# Patient Record
Sex: Female | Born: 1945
Health system: Southern US, Community
[De-identification: ages and names within clinical notes are randomized; demographics above are authoritative.]

## PROBLEM LIST (undated history)

## (undated) DIAGNOSIS — I10 Essential (primary) hypertension: Secondary | ICD-10-CM

## (undated) DIAGNOSIS — M1990 Unspecified osteoarthritis, unspecified site: Secondary | ICD-10-CM

## (undated) DIAGNOSIS — M539 Dorsopathy, unspecified: Secondary | ICD-10-CM

## (undated) DIAGNOSIS — R7611 Nonspecific reaction to tuberculin skin test without active tuberculosis: Secondary | ICD-10-CM

## (undated) DIAGNOSIS — R51 Headache: Secondary | ICD-10-CM

## (undated) DIAGNOSIS — F32A Depression, unspecified: Secondary | ICD-10-CM

## (undated) DIAGNOSIS — Z974 Presence of external hearing-aid: Secondary | ICD-10-CM

## (undated) DIAGNOSIS — Z972 Presence of dental prosthetic device (complete) (partial): Secondary | ICD-10-CM

## (undated) DIAGNOSIS — F419 Anxiety disorder, unspecified: Secondary | ICD-10-CM

## (undated) DIAGNOSIS — R519 Headache, unspecified: Secondary | ICD-10-CM

## (undated) DIAGNOSIS — Z973 Presence of spectacles and contact lenses: Secondary | ICD-10-CM

## (undated) DIAGNOSIS — F329 Major depressive disorder, single episode, unspecified: Secondary | ICD-10-CM

## (undated) HISTORY — PX: LUMBAR FUSION: SHX111

## (undated) HISTORY — PX: JOINT REPLACEMENT: SHX530

## (undated) HISTORY — PX: BREAST SURGERY: SHX581

## (undated) HISTORY — PX: TONSILLECTOMY: SUR1361

## (undated) HISTORY — PX: ABDOMINAL HYSTERECTOMY: SHX81

## (undated) HISTORY — PX: BACK SURGERY: SHX140

## (undated) HISTORY — PX: MYRINGOTOMY WITH TUBE PLACEMENT: SHX5663

## (undated) HISTORY — PX: TUBAL LIGATION: SHX77

---

## 1999-01-27 ENCOUNTER — Observation Stay (HOSPITAL_COMMUNITY): Admission: RE | Admit: 1999-01-27 | Discharge: 1999-01-28 | Payer: Self-pay | Admitting: Neurosurgery

## 1999-01-27 ENCOUNTER — Encounter: Payer: Self-pay | Admitting: Neurosurgery

## 2001-09-13 ENCOUNTER — Emergency Department (HOSPITAL_COMMUNITY): Admission: EM | Admit: 2001-09-13 | Discharge: 2001-09-14 | Payer: Self-pay | Admitting: *Deleted

## 2001-09-14 ENCOUNTER — Ambulatory Visit (HOSPITAL_COMMUNITY): Admission: RE | Admit: 2001-09-14 | Discharge: 2001-09-14 | Payer: Self-pay | Admitting: *Deleted

## 2004-04-27 ENCOUNTER — Emergency Department: Payer: Self-pay | Admitting: Emergency Medicine

## 2005-03-26 ENCOUNTER — Other Ambulatory Visit: Payer: Self-pay

## 2005-03-26 ENCOUNTER — Emergency Department: Payer: Self-pay | Admitting: Emergency Medicine

## 2005-05-18 ENCOUNTER — Ambulatory Visit: Payer: Self-pay | Admitting: Unknown Physician Specialty

## 2007-01-16 ENCOUNTER — Ambulatory Visit: Payer: Self-pay | Admitting: Internal Medicine

## 2007-01-18 ENCOUNTER — Ambulatory Visit: Payer: Self-pay | Admitting: Family Medicine

## 2009-09-27 ENCOUNTER — Ambulatory Visit: Payer: Self-pay | Admitting: Unknown Physician Specialty

## 2009-09-30 ENCOUNTER — Ambulatory Visit: Payer: Self-pay | Admitting: Internal Medicine

## 2010-05-10 ENCOUNTER — Emergency Department: Payer: Self-pay | Admitting: Emergency Medicine

## 2010-05-30 ENCOUNTER — Ambulatory Visit: Payer: Self-pay | Admitting: Rheumatology

## 2010-06-28 ENCOUNTER — Ambulatory Visit: Payer: Self-pay | Admitting: Neurosurgery

## 2010-07-11 ENCOUNTER — Inpatient Hospital Stay (HOSPITAL_COMMUNITY)
Admission: RE | Admit: 2010-07-11 | Discharge: 2010-07-11 | DRG: 865 | Disposition: A | Discharge status: Home / Self Care | Payer: BC Managed Care – PPO | Source: Ambulatory Visit | Attending: Neurosurgery | Admitting: Neurosurgery

## 2010-07-11 ENCOUNTER — Ambulatory Visit (HOSPITAL_COMMUNITY): Payer: BC Managed Care – PPO

## 2010-07-11 DIAGNOSIS — F17200 Nicotine dependence, unspecified, uncomplicated: Secondary | ICD-10-CM | POA: Diagnosis present

## 2010-07-11 DIAGNOSIS — M47812 Spondylosis without myelopathy or radiculopathy, cervical region: Principal | ICD-10-CM | POA: Diagnosis present

## 2010-07-11 DIAGNOSIS — R51 Headache: Secondary | ICD-10-CM | POA: Diagnosis present

## 2010-07-11 DIAGNOSIS — I10 Essential (primary) hypertension: Secondary | ICD-10-CM | POA: Diagnosis present

## 2010-07-11 LAB — BASIC METABOLIC PANEL
BUN: 8 mg/dL (ref 6–23)
CO2: 29 mEq/L (ref 19–32)
Calcium: 9.4 mg/dL (ref 8.4–10.5)
Chloride: 101 mEq/L (ref 96–112)
Creatinine, Ser: 0.83 mg/dL (ref 0.4–1.2)
GFR calc Af Amer: >60 mL/min (ref >60)
GFR calc non Af Amer: >60 mL/min (ref >60)
Glucose, Bld: 97 mg/dL (ref 70–99)
Potassium: 3.4 mEq/L — ABNORMAL LOW (ref 3.5–5.1)
Sodium: 138 mEq/L (ref 135–145)

## 2010-07-11 LAB — CBC
HCT: 45.1 % (ref 36.0–46.0)
Hemoglobin: 15.8 g/dL — ABNORMAL HIGH (ref 12.0–15.0)
MCH: 32.1 pg (ref 26.0–34.0)
MCHC: 35 g/dL (ref 30.0–36.0)
MCV: 91.7 fL (ref 78.0–100.0)
Platelets: 233 10*3/uL (ref 150–400)
RBC: 4.92 MIL/uL (ref 3.87–5.11)
RDW: 13.4 % (ref 11.5–15.5)
WBC: 9 10*3/uL (ref 4.0–10.5)

## 2010-07-11 LAB — DIFFERENTIAL
Basophils Absolute: 0 10*3/uL (ref 0.0–0.1)
Basophils Relative: 0 % (ref 0–1)
Eosinophils Absolute: 0.2 10*3/uL (ref 0.0–0.7)
Eosinophils Relative: 2 % (ref 0–5)
Lymphocytes Relative: 32 % (ref 12–46)
Lymphs Abs: 2.8 10*3/uL (ref 0.7–4.0)
Monocytes Absolute: 0.6 10*3/uL (ref 0.1–1.0)
Monocytes Relative: 7 % (ref 3–12)
Neutro Abs: 5.3 10*3/uL (ref 1.7–7.7)
Neutrophils Relative %: 59 % (ref 43–77)

## 2010-07-11 LAB — TYPE AND SCREEN
ABO/RH(D): A POS
Antibody Screen: NEGATIVE

## 2010-07-11 LAB — ABO/RH: ABO/RH(D): A POS

## 2010-08-04 NOTE — Op Note (Signed)
Carol [name], Carol [name]                ACCOUNT NO.:  [id number]  MEDICAL RECORD NO.:  [id number]           PATIENT TYPE:  I  LOCATION:  3535                         FACILITY:  [facility]  PHYSICIAN:  Emrie Gayle A. Javonni Macke, M.D.    DATE OF BIRTH:  03-18-1946  DATE OF PROCEDURE:  07/11/2010 DATE OF DISCHARGE:  07/11/2010                              OPERATIVE REPORT   PREOPERATIVE DIAGNOSES:  C5-6 spondylosis with stenosis.  Status post C4- 5 anterior cervical diskectomy and fusion with allograft and plating.  POSTOPERATIVE DIAGNOSES:  C5-6 spondylosis with stenosis.  Status post C4-5 anterior cervical diskectomy and fusion with allograft and plating.  PROCEDURE NAME:  Reexploration of C4-5 anterior cervical fusion with removal of C4-5 anterior cervical plate and instrumentation.  C5-6 anterior cervical diskectomy and fusion with allograft and plating.  SURGEON:  Kathaleen Maser. Maudene Stotler, MD  ASSISTANT:  Donalee Citrin, MD  ANESTHESIA:  General endotracheal.  INDICATIONS:  Ms. Troyer is a 65 year old female status post a C4-5 anterior cervical fusion with good results done remotely in the past. The patient presents now with significant neck and bilateral upper extremity pain, paresthesias and weakness consistent with C6 radiculopathies and some element of a early cervical myelopathy failing conservative management.  Workup demonstrates evidence of marked stenosis at C5-6 level.  Fusion at C4-5 appears solid.  The patient presents now for C5-6 anterior cervical diskectomy and fusion with allograft and plating in the hopes of improving her symptoms.  OPERATIVE NOTE:  The patient was brought to the operating room and placed on the operating table in supine position.  After adequate level of anesthesia was achieved, the patient was placed in supine with neck slightly extended and held in place with halter traction.  The patient's anterior cervical region was prepped and draped sterilely.  A #10 blade was used  to make a linear skin incision overlying the C4-5 interspace. This was carried down sharply to the platysma.  The platysma was divided vertically and dissection proceeded along the medial border of the sternomastoid muscle and carotid sheath.  Trachea and esophagus were mobilized and tracked towards the left.  Prevertebral fascia was stripped off the anterior spinal column.  Longus colli muscle was then elevated bilaterally using electrocautery.  Deep self-retaining retractor was placed.  Intraoperative fluoroscopy was used and the levels were confirmed.  Previously placed anterior cervical plate at C4- 5 was identified.  This was then disassembled and removed.  Fusion at C4- 5 was then inspected and found to be quite solid.  Disk space at C5-6 was then incised with a #15 blade in a rectangular fashion.  Wide disk space clean-out was achieved using pituitary rongeurs, forward and backward angled Karlin curettes, Kerrison rongeurs, and high-speed drill.  All elements of disk removed down to the level of the posterior annulus.  The microscope was brought into the field and used throughout the remainder of the diskectomy.  The remaining aspects of annulus and osteophytes were removed down to the posterior annulus.  Microscope was then brought into the field and used for the remainder of the diskectomy.  The remaining aspects of  annulus and osteophytes were removed using high-speed drill down to the level of the posterior longitudinal ligament.  The posterior longitudinal ligament was elevated and resected in a piecemeal fashion using Kerrison rongeurs.  The underlying thecal sac was then identified.  Wide central decompression was then performed by undercutting the bodies of C5 and C6. Decompression was then proceeded out each neural foramen.  Wide anterior foraminotomies were then performed along the course of the exiting C6 nerve roots bilaterally.  At this point, a very thorough diskectomy  was then achieved.  There was no evidence of injury to thecal sac or nerve roots.  Wound was then irrigated with antibiotic solution.  Gelfoam was placed topically for hemostasis and found to be good.  A 7-mm Cornerstone allograft wedge was then packed into place and recessed approximately 1 mm of anterior cortical margin.  A 25-mm Atlantis anterior cervical plate was then placed over the C5 and C6 levels using a fluoroscopic guidance.  All 4 screws were given final tightening and found to be solidly within bone.  Locking screws were engaged at both levels.  Final images revealed good position of bone grafts, hardware at proper operative level, and normal alignment of spine.  Wound was then inspected for hemostasis and found to be good.  Wound was then closed in a typical fashion.  Steri-Strips and sterile dressing were applied. There were no complications.  The patient tolerated the procedure well and she returned to the recovery room postoperatively.          ______________________________ Kathaleen Maser Bernise Sylvain, M.D.     HAP/MEDQ  D:  07/11/2010  T:  07/12/2010  Job:  [id number]  Electronically Signed by Julio Sicks M.D. on 08/04/2010 11:16:43 AM

## 2010-09-01 ENCOUNTER — Ambulatory Visit
Admission: RE | Admit: 2010-09-01 | Discharge: 2010-09-01 | Disposition: A | Discharge status: Home / Self Care | Payer: BC Managed Care – PPO | Source: Ambulatory Visit | Attending: Neurosurgery | Admitting: Neurosurgery

## 2010-09-01 ENCOUNTER — Other Ambulatory Visit: Payer: Self-pay | Admitting: Neurosurgery

## 2010-09-01 DIAGNOSIS — M542 Cervicalgia: Secondary | ICD-10-CM

## 2010-09-01 DIAGNOSIS — M545 Low back pain, unspecified: Secondary | ICD-10-CM

## 2010-09-01 DIAGNOSIS — M79606 Pain in leg, unspecified: Secondary | ICD-10-CM

## 2010-09-05 ENCOUNTER — Ambulatory Visit
Admission: RE | Admit: 2010-09-05 | Discharge: 2010-09-05 | Disposition: A | Discharge status: Home / Self Care | Payer: BC Managed Care – PPO | Source: Ambulatory Visit | Attending: Neurosurgery | Admitting: Neurosurgery

## 2010-09-05 DIAGNOSIS — M545 Low back pain, unspecified: Secondary | ICD-10-CM

## 2010-09-05 DIAGNOSIS — M79606 Pain in leg, unspecified: Secondary | ICD-10-CM

## 2011-05-15 ENCOUNTER — Emergency Department: Payer: Self-pay | Admitting: Emergency Medicine

## 2011-05-15 LAB — URINALYSIS, COMPLETE
Ph: 6 (ref 4.5–8.0)
Protein: 100
Squamous Epithelial: 10
WBC UR: 57 /HPF (ref 0–5)

## 2011-05-15 LAB — CBC
HGB: 17.1 g/dL — ABNORMAL HIGH (ref 12.0–16.0)
MCHC: 33.8 g/dL (ref 32.0–36.0)

## 2011-05-15 LAB — COMPREHENSIVE METABOLIC PANEL
Alkaline Phosphatase: 77 U/L (ref 50–136)
Anion Gap: 16 (ref 7–16)
Calcium, Total: 9.7 mg/dL (ref 8.5–10.1)
Co2: 25 mmol/L (ref 21–32)
EGFR (Non-African Amer.): >60
Osmolality: 279 (ref 275–301)
Potassium: 3.2 mmol/L — ABNORMAL LOW (ref 3.5–5.1)
Sodium: 139 mmol/L (ref 136–145)

## 2011-05-15 LAB — LIPASE, BLOOD: Lipase: 153 U/L (ref 73–393)

## 2011-05-17 LAB — URINE CULTURE

## 2011-08-18 ENCOUNTER — Ambulatory Visit: Payer: Self-pay

## 2011-08-23 ENCOUNTER — Ambulatory Visit: Payer: Self-pay | Admitting: Family Medicine

## 2011-10-02 ENCOUNTER — Observation Stay: Payer: Self-pay | Admitting: Internal Medicine

## 2011-10-02 LAB — COMPREHENSIVE METABOLIC PANEL
Bilirubin,Total: 1 mg/dL (ref 0.2–1.0)
Calcium, Total: 8.9 mg/dL (ref 8.5–10.1)
Chloride: 100 mmol/L (ref 98–107)
Co2: 27 mmol/L (ref 21–32)
EGFR (Non-African Amer.): >60
Potassium: 3.1 mmol/L — ABNORMAL LOW (ref 3.5–5.1)
SGOT(AST): 88 U/L — ABNORMAL HIGH (ref 15–37)
SGPT (ALT): 86 U/L — ABNORMAL HIGH
Sodium: 139 mmol/L (ref 136–145)

## 2011-10-03 LAB — CBC WITH DIFFERENTIAL/PLATELET
Eosinophil %: 0.3 %
Lymphocyte #: 1.9 10*3/uL (ref 1.0–3.6)
Lymphocyte %: 15.9 %
MCV: 93 fL (ref 80–100)
Monocyte %: 10.4 %
Neutrophil #: 8.9 10*3/uL — ABNORMAL HIGH (ref 1.4–6.5)
Platelet: 241 10*3/uL (ref 150–440)

## 2011-10-03 LAB — BASIC METABOLIC PANEL
Anion Gap: 7 (ref 7–16)
BUN: 20 mg/dL — ABNORMAL HIGH (ref 7–18)
Chloride: 103 mmol/L (ref 98–107)
Co2: 27 mmol/L (ref 21–32)
EGFR (African American): >60
Osmolality: 276 (ref 275–301)

## 2011-10-03 LAB — MAGNESIUM: Magnesium: 1.7 mg/dL — ABNORMAL LOW

## 2011-10-04 LAB — CBC WITH DIFFERENTIAL/PLATELET
Basophil #: 0 10*3/uL (ref 0.0–0.1)
Eosinophil %: 1.4 %
Lymphocyte #: 1.3 10*3/uL (ref 1.0–3.6)
MCV: 93 fL (ref 80–100)
Monocyte #: 0.7 x10 3/mm (ref 0.2–0.9)
Monocyte %: 10.5 %
Neutrophil %: 68.5 %
Platelet: 171 10*3/uL (ref 150–440)
RBC: 4.32 10*6/uL (ref 3.80–5.20)
RDW: 13.1 % (ref 11.5–14.5)
WBC: 6.8 10*3/uL (ref 3.6–11.0)

## 2011-10-04 LAB — BASIC METABOLIC PANEL
Anion Gap: 10 (ref 7–16)
BUN: 11 mg/dL (ref 7–18)
Calcium, Total: 8 mg/dL — ABNORMAL LOW (ref 8.5–10.1)
EGFR (Non-African Amer.): >60
Glucose: 122 mg/dL — ABNORMAL HIGH (ref 65–99)
Osmolality: 278 (ref 275–301)

## 2011-10-04 LAB — MAGNESIUM: Magnesium: 2.2 mg/dL

## 2012-07-17 ENCOUNTER — Ambulatory Visit: Payer: Self-pay | Admitting: Obstetrics and Gynecology

## 2012-07-26 ENCOUNTER — Ambulatory Visit: Payer: Self-pay | Admitting: Obstetrics and Gynecology

## 2012-07-30 LAB — PATHOLOGY REPORT

## 2012-08-02 ENCOUNTER — Ambulatory Visit: Payer: Self-pay | Admitting: Gynecologic Oncology

## 2012-08-06 ENCOUNTER — Ambulatory Visit: Payer: Self-pay | Admitting: Gynecologic Oncology

## 2012-08-18 ENCOUNTER — Ambulatory Visit: Payer: Self-pay | Admitting: Gynecologic Oncology

## 2012-08-27 ENCOUNTER — Ambulatory Visit: Payer: Self-pay | Admitting: Obstetrics and Gynecology

## 2012-08-27 LAB — CBC
MCH: 31.3 pg (ref 26.0–34.0)
MCV: 91 fL (ref 80–100)
WBC: 7 10*3/uL (ref 3.6–11.0)

## 2012-08-27 LAB — BASIC METABOLIC PANEL
Anion Gap: 4 — ABNORMAL LOW (ref 7–16)
Chloride: 105 mmol/L (ref 98–107)
Co2: 31 mmol/L (ref 21–32)
Creatinine: 0.81 mg/dL (ref 0.60–1.30)
EGFR (African American): >60
EGFR (Non-African Amer.): >60
Glucose: 81 mg/dL (ref 65–99)

## 2012-09-04 ENCOUNTER — Inpatient Hospital Stay: Payer: Self-pay | Admitting: Obstetrics and Gynecology

## 2012-09-04 LAB — BASIC METABOLIC PANEL
Anion Gap: 6 — ABNORMAL LOW (ref 7–16)
Anion Gap: 7 (ref 7–16)
Anion Gap: 6 — ABNORMAL LOW (ref 7–16)
BUN: 15 mg/dL (ref 7–18)
Calcium, Total: 7.7 mg/dL — ABNORMAL LOW (ref 8.5–10.1)
Chloride: 107 mmol/L (ref 98–107)
Chloride: 110 mmol/L — ABNORMAL HIGH (ref 98–107)
Co2: 24 mmol/L (ref 21–32)
Co2: 24 mmol/L (ref 21–32)
Creatinine: 1.55 mg/dL — ABNORMAL HIGH (ref 0.60–1.30)
EGFR (African American): 40 — ABNORMAL LOW
EGFR (African American): 37 — ABNORMAL LOW
EGFR (Non-African Amer.): 35 — ABNORMAL LOW
EGFR (Non-African Amer.): 32 — ABNORMAL LOW
Glucose: 106 mg/dL — ABNORMAL HIGH (ref 65–99)
Osmolality: 279 (ref 275–301)
Osmolality: 281 (ref 275–301)
Potassium: 3.9 mmol/L (ref 3.5–5.1)
Sodium: 137 mmol/L (ref 136–145)
Sodium: 140 mmol/L (ref 136–145)

## 2012-09-04 LAB — CBC WITH DIFFERENTIAL/PLATELET
Basophil %: 0.2 %
Eosinophil #: 0 10*3/uL (ref 0.0–0.7)
HCT: 36 % (ref 35.0–47.0)
Lymphocyte #: 0.6 10*3/uL — ABNORMAL LOW (ref 1.0–3.6)
Lymphocyte %: 4.3 %
MCH: 31.3 pg (ref 26.0–34.0)
MCHC: 34.4 g/dL (ref 32.0–36.0)
Monocyte %: 6.6 %
Neutrophil %: 88.9 %
Platelet: 170 10*3/uL (ref 150–440)
RBC: 3.97 10*6/uL (ref 3.80–5.20)

## 2012-09-04 LAB — CREATININE, URINE, RANDOM: Creatinine, Urine Random: 46.6 mg/dL (ref 30.0–125.0)

## 2012-09-05 LAB — CBC WITH DIFFERENTIAL/PLATELET
Basophil #: 0 10*3/uL (ref 0.0–0.1)
Basophil %: 0.1 %
Eosinophil #: 0 10*3/uL (ref 0.0–0.7)
Eosinophil %: 0.2 %
HGB: 12.1 g/dL (ref 12.0–16.0)
Lymphocyte %: 11.8 %
MCH: 31.3 pg (ref 26.0–34.0)
MCHC: 34.5 g/dL (ref 32.0–36.0)
Platelet: 152 10*3/uL (ref 150–440)
WBC: 7.8 10*3/uL (ref 3.6–11.0)

## 2012-09-05 LAB — BASIC METABOLIC PANEL
Anion Gap: 5 — ABNORMAL LOW (ref 7–16)
BUN: 10 mg/dL (ref 7–18)
Calcium, Total: 8.2 mg/dL — ABNORMAL LOW (ref 8.5–10.1)
Chloride: 110 mmol/L — ABNORMAL HIGH (ref 98–107)
Chloride: 111 mmol/L — ABNORMAL HIGH (ref 98–107)
Co2: 26 mmol/L (ref 21–32)
Creatinine: 1.28 mg/dL (ref 0.60–1.30)
EGFR (African American): 42 — ABNORMAL LOW
EGFR (African American): 51 — ABNORMAL LOW
EGFR (Non-African Amer.): 44 — ABNORMAL LOW
Glucose: 105 mg/dL — ABNORMAL HIGH (ref 65–99)
Osmolality: 280 (ref 275–301)
Potassium: 3.8 mmol/L (ref 3.5–5.1)
Sodium: 140 mmol/L (ref 136–145)
Sodium: 141 mmol/L (ref 136–145)

## 2012-09-05 LAB — URINALYSIS, COMPLETE
Bilirubin,UR: NEGATIVE
Glucose,UR: NEGATIVE mg/dL (ref 0–75)
Nitrite: NEGATIVE
Protein: 30

## 2012-09-05 LAB — PATHOLOGY REPORT

## 2012-09-05 LAB — TROPONIN I: Troponin-I: <0.02 ng/mL

## 2012-09-06 LAB — BASIC METABOLIC PANEL
Anion Gap: 5 — ABNORMAL LOW (ref 7–16)
Calcium, Total: 8.9 mg/dL (ref 8.5–10.1)
Chloride: 104 mmol/L (ref 98–107)
EGFR (African American): >60
Osmolality: 271 (ref 275–301)
Potassium: 3.4 mmol/L — ABNORMAL LOW (ref 3.5–5.1)
Sodium: 136 mmol/L (ref 136–145)

## 2012-09-06 LAB — CBC WITH DIFFERENTIAL/PLATELET
HCT: 37.9 % (ref 35.0–47.0)
HGB: 13 g/dL (ref 12.0–16.0)
Monocyte #: 0.6 x10 3/mm (ref 0.2–0.9)
Monocyte %: 8.3 %
Platelet: 178 10*3/uL (ref 150–440)
RDW: 13.9 % (ref 11.5–14.5)
WBC: 7.6 10*3/uL (ref 3.6–11.0)

## 2012-10-16 ENCOUNTER — Ambulatory Visit: Payer: Self-pay | Admitting: Specialist

## 2012-11-21 DIAGNOSIS — M1990 Unspecified osteoarthritis, unspecified site: Secondary | ICD-10-CM | POA: Insufficient documentation

## 2012-11-21 DIAGNOSIS — F1190 Opioid use, unspecified, uncomplicated: Secondary | ICD-10-CM | POA: Insufficient documentation

## 2012-11-21 DIAGNOSIS — M545 Low back pain, unspecified: Secondary | ICD-10-CM | POA: Insufficient documentation

## 2012-11-21 DIAGNOSIS — M5431 Sciatica, right side: Secondary | ICD-10-CM | POA: Insufficient documentation

## 2013-02-26 DIAGNOSIS — M797 Fibromyalgia: Secondary | ICD-10-CM | POA: Insufficient documentation

## 2013-02-26 DIAGNOSIS — M79641 Pain in right hand: Secondary | ICD-10-CM | POA: Insufficient documentation

## 2013-05-23 ENCOUNTER — Ambulatory Visit: Payer: Self-pay | Admitting: Medical

## 2013-05-26 ENCOUNTER — Ambulatory Visit: Payer: Self-pay | Admitting: Physician Assistant

## 2013-10-14 ENCOUNTER — Ambulatory Visit: Payer: Self-pay | Admitting: Otolaryngology

## 2014-01-15 DIAGNOSIS — M1712 Unilateral primary osteoarthritis, left knee: Secondary | ICD-10-CM | POA: Insufficient documentation

## 2014-02-03 ENCOUNTER — Ambulatory Visit: Payer: Self-pay | Admitting: Unknown Physician Specialty

## 2014-03-04 ENCOUNTER — Ambulatory Visit: Payer: Self-pay | Admitting: Unknown Physician Specialty

## 2014-03-20 HISTORY — PX: HAND TENDON SURGERY: SHX663

## 2014-04-07 DIAGNOSIS — Z96652 Presence of left artificial knee joint: Secondary | ICD-10-CM | POA: Insufficient documentation

## 2014-06-18 DIAGNOSIS — Z96652 Presence of left artificial knee joint: Secondary | ICD-10-CM | POA: Insufficient documentation

## 2014-07-07 NOTE — H&P (Signed)
PATIENT NAME:  Carol NimsREILLY, Ashaya S MR#:  [id number] DATE OF BIRTH:  01-06-1946  DATE OF ADMISSION:  10/02/2011  PRESENTING COMPLAINT: Nausea and vomiting x3 days.   HISTORY OF PRESENT ILLNESS: This is a 69 year old Caucasian female who complains of a three day history of intractable nausea and vomiting preceded by three days of diarrhea and mild abdominal discomfort. She denies ingestion of any poorly cooked foods or stored foods. She lives with her husband and another household member and neither of them have been affected with similar symptoms. The patient also complains of low-grade fever. Denies any recent flulike illness. She has not taken any antiemetics or antidiarrheal agents and has not sought medical attention elsewhere. She was seen in my office today where she was noted to have markedly lost weight. She was quite tachycardic with a 40 pound rise in orthostatic measurements and as such will admit her for rehydration.   PAST MEDICAL HISTORY:  1. Osteoporosis. 2. Osteoarthritis. 3. Sciatica. 4. Migraine headaches.  PAST SURGICAL HISTORY: 1. Knee surgery in 1998. 2. Neck surgery in 2000. 3. Tubal ligation in 1994.  5. Tonsillectomy and adenoidectomy.   CURRENT MEDICATIONS:  1. Citalopram hydrobromide 20 mg once a day.  2. Citracal/Vitamin D 250/200 1 tablet daily.  3. Maxalt-MLT 10 mg use as directed p.r.n.   SOCIAL HISTORY: Active alcohol user. Denies social drug use. She is an active smoker. Currently married. Resides with her husband.   ALLERGIES: Penicillin.   FAMILY HISTORY: Noncontributory except history of systemic lupus erythematosus.   REVIEW OF SYSTEMS: SYSTEMIC: Feels lethargic. Denies any night sweats. Recent 10 pound weight loss since her previous visit. HEAD: Denies any headache. NECK: No neck pain or stiffness. EYES: Itching, watering of the eyes. ENT: Hearing loss in right ear. Postnasal drip. CARDIOVASCULAR: Denies chest pain. PULMONARY: Denies cough or shortness of  breath. GASTROINTESTINAL: Normal appetite. Complains of dyspepsia. GENITOURINARY: Denies urinary frequency. MUSCULOSKELETAL: No new arthritic pains. NEUROLOGICAL: Denies dizziness, motor or sensory disturbances. PSYCHOLOGICAL: Denies any anxiety or worsening of depression.   PHYSICAL EXAMINATION:   GENERAL: Elderly female who looks younger than stated age.   VITAL SIGNS: Temperature 92 Fahrenheit, pulse 134 per minute, blood pressure 120/90, oxygen saturation 97% on room air, weight 155.6 pound.   HEENT: Reduced skin turgor. Dry oral mucosa. Sunken eyes. Not pale.   LUNGS: Clear to auscultation bilaterally. No rales or wheeze.   CARDIOVASCULAR: Heart regular. Normal heart sounds.   ABDOMEN: Full, soft, mild right lower and left lower quadrant tenderness.   EXTREMITIES: Warm, well perfused.   ORTHOSTATIC VITAL SIGNS: Lying pulse 127, blood pressure 120/92; standing pulse 167, blood pressure 110/90.   IMPRESSION: This is a 69 year old lady with gastroenteritis, orthostatic hypotension, and dehydration.   PLAN:  1. Will admit to observation. 2. Will rehydrate. 3. Check complete metabolic profile. 4. Order stool culture and analysis.  5. Administer antiemetics.  6. For depression will continue citalopram.   7. Will adjust her orders based on her biochemical results.   ____________________________ Silas FloodSheikh A. Ellsworth Lennoxejan-Sie, MD sat:drc D: 10/02/2011 15:28:18 ET T: 10/02/2011 16:04:29 ET JOB#: [id number]  cc: Sheikh A. Ellsworth Lennoxejan-Sie, MD, <Dictator> Charlesetta GaribaldiSHEIKH A TEJAN-SIE MD ELECTRONICALLY SIGNED 11/08/2011 12:47

## 2014-07-10 NOTE — Consult Note (Signed)
PCN: Hives   Impression 1. elevated BP n hx of HTN 2. ARI from gent/toradol  3. s/p hysterectomy   Plan 1. would not d/c on po medds as pt has been f/u dr Linus Makotejani sie an d suspect elevated BP here from pain and anxiety  2. creatinie trending down, can f/u PCP repeat BMP nex tweek as well  thank you  pt could be d/c when ever OB feels ready  d/w Dr Bonney AidStaebler thank you for consult   Electronic Signatures for Addendum Section:  Adrian SaranMody, Ladan Vanderzanden (MD) (Signed Addendum 19-Jun-14 10:58)  [id number]   Electronic Signatures: Adrian SaranMody, Taleeya Blondin (MD)  (Signed 19-Jun-14 10:58)  Authored: Home Medications, Allergies, Impression/Plan   Last Updated: 19-Jun-14 10:58 by Adrian SaranMody, Abbey Veith (MD)

## 2014-07-10 NOTE — Op Note (Signed)
PATIENT NAME:  Carol NimsREILLY, Jennife S MR#:  [id number] DATE OF BIRTH:  1945/05/29  DATE OF OPERATION:  07/26/2012  PREOPERATIVE DIAGNOSES: 1.  Postmenopausal bleeding.  2.  Endometrial polyps.   POSTOPERATIVE DIAGNOSES: 1.  Postmenopausal bleeding.  2.  Endometrial polyps.   OPEARTIONS PERFORMED: Hysteroscopy, and dilatation and curettage.   ANESTHESIA USED: General.   PRIMARY SURGEON: Vena AustriaAndreas Kenneisha Cochrane, MD.  ESTIMATED BLOOD LOSS: 25 mL.   OPERATIVE FLUIDS: 600 mL.   URINE OUTPUT: 25 mL straight cath at the beginning of the case.   COMPLICATIONS: None.   FINDINGS: The endometrial cavity contained several small polyps, a series of which was coming off of the left cornua, one posterior and several left-sided polyps. These were removed with a combination of polyp forceps and sharp curettage. The hysteroscopy at the end of the case revealed polyps had been removed.   SPECIMENS REMOVED: Endometrial curettings.   PATIENT'S CONDITION FOLLOWING PROCEDURE: Stable.   PROCEDURE IN DETAIL: Risks, benefits, and alternatives of the procedure were discussed with the patient prior to proceeding to the operating room. The patient was taken to the operating room where she was placed under general endotracheal anesthesia. The patient was prepped and draped in the usual sterile fashion. A sterile speculum was placed, visualizing the cervix. The anterior lip of the cervix was grasped with a single-toothed tenaculum and the cervix was serially dilated using Pratt dilators. Hysteroscopy was then performed, noting the above findings. Following hysteroscopy, sharp curettage was performed. Repeat hysteroscopy revealed that several polyps were still present on the right side of the uterine fundus. Polyp forceps were used to remove these polyps and sharp curettage was repeated. Following this, hysteroscopy was undertaken one more time noting the endometrial polyps had been removed successfully. The single-toothed  tenaculum was removed. The tenaculum sites were noted to be hemostatic. Sponge and instrument counts were correct x 2. The patient tolerated the procedure well and was taken to the recovery room in stable condition.    ____________________________ Florina OuAndreas M. Bonney AidStaebler, MD ams:dm D: 07/28/2012 11:00:46 ET T: 07/29/2012 07:43:23 ET JOB#: [id number]  cc: Florina OuAndreas M. Bonney AidStaebler, MD, <Dictator> Lorrene ReidANDREAS M Clint Biello MD ELECTRONICALLY SIGNED 08/06/2012 20:56

## 2014-07-10 NOTE — Op Note (Signed)
PATIENT NAME:  Carol [name], Carol [name] MR#:  [id number] DATE OF BIRTH:  [birth year]  DATE OF PROCEDURE:  09/03/2012  PREOPERATIVE DIAGNOSES:  1.  Postmenopausal bleeding  2.  Endometrial intraepithelial neoplasia with atypia.  POSTOPERATIVE DIAGNOSES:  1.  Postmenopausal bleeding  2.  Endometrial intraepithelial neoplasia with atypia.  OPERATION PERFORMED: Robotic-assisted total laparoscopic hysterectomy and bilateral salpingo-oophorectomy.    ANESTHESIA:  General.   PRIMARY SURGEON: Vena Austria, MD   PREOPERATIVE ANTIBIOTICS: 600 mg of clindamycin and 115 mg of gentamicin.   DRAINS OR TUBES: Foley to gravity drainage.   IMPLANTS: None.   ESTIMATED BLOOD LOSS: 50 mL.  OPERATIVE FLUIDS: 1 liter crystalloid.   COMPLICATIONS: None.   SPECIMENS REMOVED: Uterus and cervix, right and left ovaries and tubes.   INTRAOPERATIVE FINDINGS: The cervix appeared grossly normal in appearance. The uterus, fallopian tubes and ovaries were normal to gross inspection. The remainder of the intra-abdominal anatomy was normal as well.   PATIENT CONDITION FOLLOWING THE PROCEDURE: Stable.  PROCEDURE IN DETAIL: The risks, benefits, and alternatives of the procedure were discussed with the patient prior to proceeding to the operating room. The patient was taken to the operating room where general endotracheal anesthesia was administered. She was positioned in the dorsal lithotomy position, tested in steep Trendelenburg with peak inspiratory pressures of 17 with the table in steep trendelenburg. She was prepped and draped in the usual sterile fashion. Attention was turned to the patient's pelvis. An operative speculum was placed. The anterior lip of the cervix was grasped with a single-tooth tenaculum. A large V-Care retractor was placed making sure that the cuff was flushed with the cervix.  Following placement of the V-Care retractor, the single-tooth tenaculum and speculum were removed. A Foley catheter was  placed prior to placement of the V-Care. Attention was then turned to the patient's abdomen.  The umbilicus was infiltrated with Sensorcaine. A stab incision was made at the base of the umbilicus, and a Veress needle was used to gain entry into the peritoneal cavity.  Two clicks were heard.  The Veress flushed and drawing back did not return any fluid. Water drop test was consistent with intraperitoneal placement as well.  Opening pressure was 4.  Pneumoperitoneum was  begun to an insufflating pressure of 15.  An 8 mm camera port was then placed through the umbilicus. Following placement of the umbilical port, 2 lateral 8 mm robotic ports were placed with direct visualization as well as a right upper quadrant assistant port which was an 11 mm port.  The robot was then docked, and the procedure was begun. Attention was turned to the patient's left ovary. The IP ligament was identified. The ureter was noted coursing well away.  The IP ligament was ligated using the fenestrated bipolar and transected using the robotic monopolar scissors. The fallopian tube was freed off of its attachment to the mesosalpinx.  The round ligament was then cauterized using the fenestrated bipolar and transected using the monopolar scissors. The anterior leaf of the broad was skeletonized down to the level of the internal cervical os, a bladder flap begun. Attention was then turned to the posterior leaf of the broad ligament which was skeletonized down to the uterosacral ligaments. The left uterine artery was then skeletonized, allowing the ureters to fall laterally. The uterine artery was then cauterized using the fenestrated bipolar, transected using the monopolar scissors. Attention was then turned to the patient's right side where the same procedure was repeated. The anterior leaf of  the broad ligament was once again taken down to the level of the internal cervical os, and the bladder flap was joined to the previously started bladder  flap. Once the uterine artery was skeletonized, it was cauterized with a fenestrated bipolar, transected using the monopolar scissors. The uterine artery was then further lateralized.  An anterior colpotomy was then made using the monopolar scissors staying within the groove of the V-Care device.  There was some bleeding encountered at approximately 3:00 which was deemed to be a descending branch of the artery which had to be re-cauterized using the fenestrated bipolar. The colpotomy was carried around circumferentially and the specimen was amputated from the vagina.  The specimen was removed vaginally using ring forceps. The pelvis was irrigated, and the cuff was closed using a V-Loc suture in a running fashion from right to left. Once the left angle of the cuff had been closed, the V-loc was turned back on itself to anchor it. Following closure of the cuff, the pelvis was irrigated once again. Arista powder was applied to the cuff.  The 11 mm port was closed using a laparoscopic closure device and a 0 Vicryl suture. There was no remaining defect noted in the fascia after closure of the fascia. Attention was then turned to the patient's pelvis again.  Bimanual exam revealed no defects in the vaginal cuff. Cystoscopy was performed noting bilateral efflux of methylene blue dye from both ureters, no defects of the bladder dome. Following the cystoscopy, the Foley was replaced. The 11 mm port site was closed using a 4-0 Monocryl stitch. The remaining 8 mm port sites were closed using Dermabond. Sponge, needle, and instrument counts were correct x 2. The patient tolerated the procedure well and was taken to the recovery room in stable condition.    ____________________________ Florina OuAndreas M. Bonney AidStaebler, MD ams:cb D: 09/03/2012 21:58:16 ET T: 09/03/2012 22:15:05 ET JOB#: [id number]  cc: Florina OuAndreas M. Bonney AidStaebler, MD, <Dictator> Carmel SacramentoANDREAS Cathrine MusterM Delray Reza MD ELECTRONICALLY SIGNED 09/10/2012 7:26

## 2014-07-10 NOTE — Consult Note (Signed)
PATIENT NAME:  Carol [name], Carol [name] MR#:  [id number] DATE OF BIRTH:  1945/11/23  DATE OF CONSULTATION:  09/05/2012  PRIMARY CARE PHYSICIAN:  Dr. Ellsworth Lennox REFERRING PHYSICIAN:  Dr. Bonney Aid CONSULTING PHYSICIAN:  Mala Gibbard P. Madell Heino, MD  REASON FOR REQUEST:  Elevated blood pressure.   IMPRESSION: 1.  Elevated blood pressure.  No history of hypertension with baseline blood pressures 130s over 80s followed closely by Dr. Ellsworth Lennox.  2.  Acute renal failure from baseline at 0.8.  3.  Status post hysterectomy.  PLAN:  I would not discharge the patient on any blood pressure medications at this time as her baselines are 130/80s and is being followed closely by Dr. Ellsworth Lennox. I suspect some of her blood pressure issues here in the hospital are, in part, due to pain, as well as anxiety. We have written for hydralazine p.r.n. for the nurses to utilize.  As far as her acute renal failure from her baseline at 0.8, the creatinine is trending down, this is thought to be possibly due to gentamicin or Toradol or prerenal azotemia. She was seen by nephrology and patient could likely be discharged with followup with her primary care physician for repeat BMP and follow up with nephrology as an outpatient if necessary.   HISTORY OF PRESENT ILLNESS: This is a very pleasant 69 year old female who is status post hysterectomy on 09/03/2012. Hospitalist was consulted for elevated blood pressure.   REVIEW OF SYSTEMS:  CONSTITUTIONAL: No fever, fatigue, weakness, positive pain. EYES:  No blurred or double vision, glaucoma or cataracts. ENT:  No ear pain, hearing loss, seasonal allergies, snoring or postnasal drip. RESPIRATORY:  No cough, wheezing, hemoptysis, COPD. CARDIOVASCULAR:  No chest pain, orthopnea, edema, arrhythmia, dyspnea on exertion. GASTROINTESTINAL: No nausea, vomiting, diarrhea. Some mild abdominal pain. Tolerating a clear liquid diet. GENITOURINARY:  No dysuria or hematuria. ENDOCRINE: No polyuria or  polydipsia. HEMATOLOGIC AND LYMPHATIC:  No anemia or easy bruising. SKIN:  No rash or lesion. MUSCULOSKELETAL:   No limited activity. NEUROLOGIC:  No CVA, TIA or seizure. PSYCHIATRIC:  There is some anxiety/depression.  PAST MEDICAL HISTORY:  Anxiety/depression.   MEDICATIONS: 1.  Citalopram 10 mg daily.  2.  Claritin 1 tablet daily.  ALLERGIES:  PENICILLIN.  FAMILY HISTORY:  Positive for CAD, CVA.  SOCIAL HISTORY:  No tobacco, alcohol or drug use.   PAST SURGICAL HISTORY:  The patient is status post hysterectomy.   PHYSICAL EXAMINATION: VITAL SIGNS: Temperature is 98.4. Pulse is 81. Respiratory rate is 18, blood pressure 148/84, 96 on room air.  GENERAL: The patient is alert, oriented, not in acute distress.  HEENT: Head is atraumatic. Pupils are round. Sclerae anicteric. Mucous membranes are moist. Oropharynx is clear.  NECK:  Supple without JVD, carotid bruit or enlarged thyroid.  CARDIOVASCULAR: Regular rate and rhythm. No murmurs, gallops or rubs. PMI is not displaced.  ABDOMEN: Bowel sounds are positive, nontender, nondistended. No hepatosplenomegaly.  EXTREMITIES: No clubbing, cyanosis, edema. NEUROLOGIC:  Cranial nerves II through XII are intact. No focal deficit.  SKIN:  Without rash or lesions noted.   LABORATORY DATA: Sodium 140, potassium 4.0, chloride 110, bicarb 25, BUN 13, creatinine 1.49. Glucose is 105. Calcium 8.1.   Thank you for allowing [place] to participate in the care of the patient.  TIME SPENT:  Approximately 50 minutes on this consult.  Plan of care was discussed with Dr. Bonney Aid.    ____________________________ Janyth Contes. Juliene Pina, MD spm:ce D: 09/05/2012 11:01:55 ET T: 09/05/2012 12:25:31 ET JOB#: [id number]  cc:  Adelie Croswell P. Juliene PinaMody, MD, <Dictator> Silas FloodSheikh A. Ellsworth Lennoxejan-Sie, MD    Janyth ContesSITAL P Cigi Bega MD ELECTRONICALLY SIGNED 09/05/2012 21:54

## 2014-08-19 ENCOUNTER — Encounter: Payer: Self-pay | Admitting: Emergency Medicine

## 2014-08-19 ENCOUNTER — Emergency Department
Admission: EM | Admit: 2014-08-19 | Discharge: 2014-08-19 | Disposition: A | Discharge status: Home / Self Care | Payer: Medicare Other | Attending: Emergency Medicine | Admitting: Emergency Medicine

## 2014-08-19 DIAGNOSIS — Z880 Allergy status to penicillin: Secondary | ICD-10-CM | POA: Diagnosis not present

## 2014-08-19 DIAGNOSIS — Y9389 Activity, other specified: Secondary | ICD-10-CM | POA: Diagnosis not present

## 2014-08-19 DIAGNOSIS — Y9289 Other specified places as the place of occurrence of the external cause: Secondary | ICD-10-CM | POA: Diagnosis not present

## 2014-08-19 DIAGNOSIS — X58XXXA Exposure to other specified factors, initial encounter: Secondary | ICD-10-CM | POA: Diagnosis not present

## 2014-08-19 DIAGNOSIS — Y998 Other external cause status: Secondary | ICD-10-CM | POA: Insufficient documentation

## 2014-08-19 DIAGNOSIS — R55 Syncope and collapse: Secondary | ICD-10-CM | POA: Insufficient documentation

## 2014-08-19 DIAGNOSIS — T7840XA Allergy, unspecified, initial encounter: Secondary | ICD-10-CM | POA: Diagnosis present

## 2014-08-19 HISTORY — DX: Unspecified osteoarthritis, unspecified site: M19.90

## 2014-08-19 NOTE — ED Notes (Signed)
Started clindamycin yesterday and took husband oxycodone, felt shaky, heart racing, some nausea, is almost gone except for slight nausea

## 2014-08-19 NOTE — ED Notes (Signed)
Pt states she may have had an allergic reaction to an oxycodone medication of her  Husbands that she has never had before. Pt needed something for her toothache, just had dental work done and they did not give  Her anything for the pain. Pt states she felt like her heart was racing and she was shaking. Symptoms have now stopped.  Pt states while in triage she feels "a thousand times better". No acute distress noted. vss.

## 2014-08-19 NOTE — Discharge Instructions (Signed)
Near-Syncope Near-syncope (commonly known as near fainting) is sudden weakness, dizziness, or feeling like you might pass out. During an episode of near-syncope, you may also develop pale skin, have tunnel vision, or feel sick to your stomach (nauseous). Near-syncope may occur when getting up after sitting or while standing for a long time. It is caused by a sudden decrease in blood flow to the brain. This decrease can result from various causes or triggers, most of which are not serious. However, because near-syncope can sometimes be a sign of something serious, a medical evaluation is required. The specific cause is often not determined. HOME CARE INSTRUCTIONS  Monitor your condition for any changes. The following actions may help to alleviate any discomfort you are experiencing:  Have someone stay with you until you feel stable.  Lie down right away and prop your feet up if you start feeling like you might faint. Breathe deeply and steadily. Wait until all the symptoms have passed. Most of these episodes last only a few minutes. You may feel tired for several hours.   Drink enough fluids to keep your urine clear or pale yellow.   If you are taking blood pressure or heart medicine, get up slowly when seated or lying down. Take several minutes to sit and then stand. This can reduce dizziness.  Follow up with your health care provider as directed. SEEK IMMEDIATE MEDICAL CARE IF:   You have a severe headache.   You have unusual pain in the chest, abdomen, or back.   You are bleeding from the mouth or rectum, or you have black or tarry stool.   You have an irregular or very fast heartbeat.   You have repeated fainting or have seizure-like jerking during an episode.   You faint when sitting or lying down.   You have confusion.   You have difficulty walking.   You have severe weakness.   You have vision problems.  MAKE SURE YOU:   Understand these instructions.  Will  watch your condition.  Will get help right away if you are not doing well or get worse. Document Released: 03/06/2005 Document Revised: 03/11/2013 Document Reviewed: 08/09/2012 [facility] Patient Information 2015 [employer] [place]. This information is not intended to replace advice given to you by your health care provider. Make sure you discuss any questions you have with your health care provider.  Your symptoms seem to have resolved following your episode of near-syncope today.  Eat a small, carbohydrate-rich meal and increase fluid intake slowly.  Follow-up with your provider for continued symptoms or return as needed.

## 2014-08-20 NOTE — ED Provider Notes (Signed)
[facility] Emergency Department Provider Note ____________________________________________  Time seen: 1715  I have reviewed the triage vital signs and the nursing notes.  HISTORY  Chief Complaint Allergic Reaction  HPI Carol [name] is a 69 y.o. female who is here for evaluation of symptoms that she believes are due to an allergic reaction from taking her husband's oxycodone. She describes that she dosed clindamycin prior to a dental procedure she had on Tuesday. Today, she realized she had only taken one of the two tablets that she was supposed to take, so she dosed another clindamycin. About 30 minutes later she dosed one of her husband's oxycodone. Within 15 minutes of the oxycodone, she began to feel shaky, light-headed, and nauseated. She denies syncope, but noted a racing heart and fast breathing.  They called EMS and they have been here for about an hours. She is here with her husband noting her symptoms have now passed. She claims she has had both oxycodone and clindamycin in the past without similar reactions. She does now admit that her only oral intake today was a honeybun this morning at breakfast time, since she is only been able to eat soft foods.  Past Medical History  Diagnosis Date  . Arthritis    There are no active problems to display for this patient.  Past Surgical History  Procedure Laterality Date  . Joint replacement    . Abdominal hysterectomy    . Back surgery     No current outpatient prescriptions on file.  Allergies Gentamycin; Hydralazine; and Penicillins  No family history on file.  Social History History  Substance Use Topics  . Smoking status: Never Smoker   . Smokeless tobacco: Not on file  . Alcohol Use: No   Review of Systems  Constitutional: Negative for fever. Eyes: Negative for visual changes. ENT: Negative for sore throat. Cardiovascular: Negative for chest pain. Respiratory: Negative for shortness of  breath. Gastrointestinal: Negative for abdominal pain, vomiting and diarrhea. Prior symptoms now resolved. Genitourinary: Negative for dysuria. Musculoskeletal: Negative for back pain. Skin: Negative for rash. Neurological: Negative for headaches, focal weakness or numbness. ____________________________________________  PHYSICAL EXAM:  VITAL SIGNS: ED Triage Vitals  Enc Vitals Group     BP 08/19/14 1611 122/73 mmHg     Pulse Rate 08/19/14 1611 91     Resp 08/19/14 1611 20     Temp 08/19/14 1611 97.8 F (36.6 C)     Temp Source 08/19/14 1611 Oral     SpO2 08/19/14 1611 95 %     Weight 08/19/14 1611 176 lb (79.833 kg)     Height 08/19/14 1611  (1.6 m)     Head Cir --      Peak Flow --      Pain Score 08/19/14 1612 0     Pain Loc --      Pain Edu? --      Excl. in GC? --    Constitutional: Alert and oriented. Well appearing and in no distress. Eyes: Conjunctivae are normal. PERRL. Normal extraocular movements. ENT   Head: Normocephalic and atraumatic.   Nose: No congestion/rhinnorhea.   Mouth/Throat: Mucous membranes are moist.   Neck: No stridor. Hematological/Lymphatic/Immunilogical: No cervical lymphadenopathy. Cardiovascular: Normal rate, regular rhythm.  Respiratory: Normal respiratory effort.No wheezes/rales/rhonchi. Gastrointestinal: Soft and nontender. No distention. Musculoskeletal: Nontender with normal range of motion in all extremities.No lower extremity tenderness nor edema. Neurologic:  Normal speech and language. No gross focal neurologic deficits are appreciated.  Skin:  Skin is warm, dry and intact. No rash noted. Psychiatric: Mood and affect are normal. Patient exhibits appropriate insight and judgment. _____________________________________________  INITIAL IMPRESSION / ASSESSMENT AND PLAN / ED COURSE  Symptoms now resolved likely represented a near-syncopal episode due to hypoglycemia and dehydration. Patient admitting to poor oral  intake following recent dental surgery. Suggest increased fluid in take and soft, complex carbohydrate to prevent symptoms.  ____________________________________________  FINAL CLINICAL IMPRESSION(S) / ED DIAGNOSES  Final diagnoses:  Near syncope     Lissa HoardJenise V Bacon Laasia Arcos, PA-C 08/20/14 0107  Sharman CheekPhillip Stafford, MD 08/21/14 [phone]

## 2015-03-07 DIAGNOSIS — M654 Radial styloid tenosynovitis [de Quervain]: Secondary | ICD-10-CM | POA: Insufficient documentation

## 2015-03-07 DIAGNOSIS — M1811 Unilateral primary osteoarthritis of first carpometacarpal joint, right hand: Secondary | ICD-10-CM | POA: Insufficient documentation

## 2015-03-07 DIAGNOSIS — M67431 Ganglion, right wrist: Secondary | ICD-10-CM | POA: Insufficient documentation

## 2015-10-28 DIAGNOSIS — R200 Anesthesia of skin: Secondary | ICD-10-CM | POA: Insufficient documentation

## 2015-11-25 DIAGNOSIS — M5020 Other cervical disc displacement, unspecified cervical region: Secondary | ICD-10-CM | POA: Insufficient documentation

## 2016-02-17 ENCOUNTER — Other Ambulatory Visit: Payer: Self-pay | Admitting: Neurosurgery

## 2016-03-14 NOTE — Pre-Procedure Instructions (Addendum)
Carol [name]  03/14/2016       Your procedure is scheduled on January 2.  [facility] at [facility].M.  Call this number if you have problems the morning of surgery:  [phone]   Remember:  Do not eat food or drink liquids after midnight.  Take these medicines the morning of surgery with A SIP OF WATER baclofen, Eye drops, Klonopin (if needed), Lexapro, Afrin (if needed), Migraine medications if needed    STOP Meloxicam today   STOP/ Do not take Aspirin, Aleve, Naproxen, Advil, Ibuprofen, Motrin, Vitamins, Herbs, or Supplements starting today   Do not wear jewelry, make-up or nail polish.  Do not wear lotions, powders, or perfumes, or deoderant.  Do not shave 48 hours prior to surgery.  Men may shave face and neck.  Do not bring valuables to the hospital.  [facility] not [facility] for any belongings or valuables.  Contacts, dentures or bridgework may not be worn into surgery.  Leave your suitcase in the car.  After surgery it may be brought to your room.  For patients admitted to the hospital, discharge time will be determined by your treatment team.  Patients discharged the day of surgery will not be allowed to drive home.   [place] - Preparing for Surgery  Before surgery, you can play an important role.  Because skin is not sterile, your skin needs to be as free of germs as possible.  You can reduce the number of germs on you skin by washing with CHG (chlorahexidine gluconate) soap before surgery.  CHG is an antiseptic cleaner which kills germs and bonds with the skin to continue killing germs even after washing.  Please DO NOT use if you have an allergy to CHG or antibacterial soaps.  If your skin becomes reddened/irritated stop using the CHG and inform your nurse when you arrive at Short Stay.  Do not shave (including legs and underarms) for at least 48 hours prior to the first CHG shower.  You may shave your face.  Please follow  these instructions carefully:   1.  Shower with CHG Soap the night before surgery and the morning of Surgery.  2.  If you choose to wash your hair, wash your hair first as usual with your normal shampoo.  3.  After you shampoo, rinse your hair and body thoroughly to remove the shampoo.  4.  Use CHG as you would any other liquid soap.  You can apply CHG directly to the skin and wash gently with scrungie or a clean washcloth.  5.  Apply the CHG Soap to your body ONLY FROM THE NECK DOWN.  Do not use on open wounds or open sores.  Avoid contact with your eyes, ears, mouth and genitals (private parts).  Wash genitals (private parts) with your normal soap.  6.  Wash thoroughly, paying special attention to the area where your surgery will be performed.  7.  Thoroughly rinse your body with warm water from the neck down.  8.  DO NOT shower/wash with your normal soap after using and rinsing off the CHG Soap.  9.  Pat yourself dry with a clean towel.            10.  Wear clean pajamas.            11.  Place clean sheets on your bed the night of your first shower and do not sleep with pets.  Day of Surgery  Do not apply any lotions the morning of surgery.  Please wear clean clothes to the hospital/surgery center.

## 2016-03-15 ENCOUNTER — Encounter (HOSPITAL_COMMUNITY)
Admission: RE | Admit: 2016-03-15 | Discharge: 2016-03-15 | Disposition: A | Discharge status: Home / Self Care | Payer: Medicare Other | Source: Ambulatory Visit | Attending: Neurosurgery | Admitting: Neurosurgery

## 2016-03-15 ENCOUNTER — Encounter (HOSPITAL_COMMUNITY): Payer: Self-pay

## 2016-03-15 DIAGNOSIS — F329 Major depressive disorder, single episode, unspecified: Secondary | ICD-10-CM | POA: Insufficient documentation

## 2016-03-15 DIAGNOSIS — F419 Anxiety disorder, unspecified: Secondary | ICD-10-CM | POA: Diagnosis not present

## 2016-03-15 DIAGNOSIS — Z96653 Presence of artificial knee joint, bilateral: Secondary | ICD-10-CM | POA: Insufficient documentation

## 2016-03-15 DIAGNOSIS — Z79899 Other long term (current) drug therapy: Secondary | ICD-10-CM | POA: Insufficient documentation

## 2016-03-15 DIAGNOSIS — Z01812 Encounter for preprocedural laboratory examination: Secondary | ICD-10-CM | POA: Diagnosis not present

## 2016-03-15 DIAGNOSIS — Z87891 Personal history of nicotine dependence: Secondary | ICD-10-CM | POA: Insufficient documentation

## 2016-03-15 DIAGNOSIS — R9431 Abnormal electrocardiogram [ECG] [EKG]: Secondary | ICD-10-CM | POA: Insufficient documentation

## 2016-03-15 DIAGNOSIS — I10 Essential (primary) hypertension: Secondary | ICD-10-CM | POA: Diagnosis not present

## 2016-03-15 DIAGNOSIS — Z01818 Encounter for other preprocedural examination: Secondary | ICD-10-CM | POA: Diagnosis not present

## 2016-03-15 DIAGNOSIS — Z0183 Encounter for blood typing: Secondary | ICD-10-CM | POA: Diagnosis not present

## 2016-03-15 DIAGNOSIS — M48061 Spinal stenosis, lumbar region without neurogenic claudication: Secondary | ICD-10-CM | POA: Insufficient documentation

## 2016-03-15 HISTORY — DX: Headache: R51

## 2016-03-15 HISTORY — DX: Major depressive disorder, single episode, unspecified: F32.9

## 2016-03-15 HISTORY — DX: Nonspecific reaction to tuberculin skin test without active tuberculosis: R76.11

## 2016-03-15 HISTORY — DX: Anxiety disorder, unspecified: F41.9

## 2016-03-15 HISTORY — DX: Headache, unspecified: R51.9

## 2016-03-15 HISTORY — DX: Depression, unspecified: F32.A

## 2016-03-15 HISTORY — DX: Essential (primary) hypertension: I10

## 2016-03-15 LAB — BASIC METABOLIC PANEL
ANION GAP: 13 (ref 5–15)
BUN: 13 mg/dL (ref 6–20)
CO2: 28 mmol/L (ref 22–32)
Calcium: 9.6 mg/dL (ref 8.9–10.3)
Chloride: 95 mmol/L — ABNORMAL LOW (ref 101–111)
Creatinine, Ser: 0.92 mg/dL (ref 0.44–1.00)
GLUCOSE: 98 mg/dL (ref 65–99)
POTASSIUM: 2.7 mmol/L — AB (ref 3.5–5.1)
Sodium: 136 mmol/L (ref 135–145)

## 2016-03-15 LAB — CBC WITH DIFFERENTIAL/PLATELET
Basophils Absolute: 0 10*3/uL (ref 0.0–0.1)
Basophils Relative: 0 %
EOS PCT: 2 %
Eosinophils Absolute: 0.2 10*3/uL (ref 0.0–0.7)
HCT: 43 % (ref 36.0–46.0)
Hemoglobin: 14.8 g/dL (ref 12.0–15.0)
LYMPHS ABS: 3 10*3/uL (ref 0.7–4.0)
LYMPHS PCT: 30 %
MCH: 30.6 pg (ref 26.0–34.0)
MCHC: 34.4 g/dL (ref 30.0–36.0)
MCV: 88.8 fL (ref 78.0–100.0)
Monocytes Absolute: 0.6 10*3/uL (ref 0.1–1.0)
Monocytes Relative: 6 %
Neutro Abs: 6 10*3/uL (ref 1.7–7.7)
Neutrophils Relative %: 62 %
PLATELETS: 225 10*3/uL (ref 150–400)
RBC: 4.84 MIL/uL (ref 3.87–5.11)
RDW: 13.5 % (ref 11.5–15.5)
WBC: 9.8 10*3/uL (ref 4.0–10.5)

## 2016-03-15 LAB — TYPE AND SCREEN
ABO/RH(D): A POS
Antibody Screen: NEGATIVE

## 2016-03-15 LAB — SURGICAL PCR SCREEN
MRSA, PCR: NEGATIVE
STAPHYLOCOCCUS AUREUS: NEGATIVE

## 2016-03-15 NOTE — Progress Notes (Signed)
Pt. Reports that prior to a recent surgery she was sent to a Pulm. Spec. For polyps seen on a cxr & due to a positive skin test for TB.  She was "treated several yrs ago " for being a "carrier of TB" but has been told that everything is OK. Pt. Denies cardiac history, other than some rise in BP that is being well managed with Hygroton. Pt. Can't remember where or when she had an EKG last. Pt. Denies all chest concerns today. Will refer this chart to Anesth. For review due to pulm. History.

## 2016-03-15 NOTE — Progress Notes (Addendum)
Critical lab value rec'd from lab tech, Elige RadonBradley. Call to Neurosurg. Office, spoke with Shanda BumpsJessica, report given & she will communicate to Dr. Lovell SheehanJenkins & Dr. Jordan LikesPool that the  pt.'s K+ was 2.7.

## 2016-03-16 ENCOUNTER — Other Ambulatory Visit: Payer: Self-pay | Admitting: Neurosurgery

## 2016-03-16 NOTE — Progress Notes (Signed)
Anesthesia chart review: Patient is a 70 year old female scheduled for L2-3, L3-4, L4-5 PLIF on 03/21/2016 by Dr. Jordan LikesPool.  History includes former smoker, arthritis, positive PPD, depression, anxiety, migraines, HTN, hysterectomy, tonsillectomy, right TKA, left "partial" TKA,  C5-6 ACDF with removal C4-5 plate 1/61/094/23/12.   PCP is Dr. Ellsworth Lennoxejan-sie with [facility] in [place].   She saw pulmonologist Dr. Meredeth IdeFleming Gavin Potters([facility]) on 10/08/14 for follow-up pulmonary nodules. By his notes, there was no positivity on PET scan or changes over three years so PRN pulmonology follow-up recommended.  Meds include baclofen, Zyrtec, chlorthalidone, clindamycin PRN dental procedure, clonazepam, doxylamine succinate, Lexapro, Afrin nasal spray, Maxalt MLT, Crestor, Imitrex.  BP 123/63   Pulse 94   Temp 36.7 C (Oral)   Resp 18   Ht 5\' 3"  (1.6 m)   Wt 182 lb 3 oz (82.6 kg)   SpO2 97%   BMI 32.27 kg/m   EKG 03/15/16: NSR, nonspecific ST abnormality.  Spirometry 10/08/14 (Care Everywhere): SPIROMETRY: FVC was 3.66 liters, 126% of predicted FEV1 was 2.77, 120% of predicted FEV1 ratio was 76 FEF 25-75% liters per second was 102% of predicted FLOW VOLUME LOOP: Normal  Impression: Overall normal  Preoperative labs noted. CBC WNL. BMET WNL except Cl 95 and K 2.7. Covering neurosurgeon Dr. Lovell SheehanJenkins notified on 03/15/16 and called her in a prescription for Kdur. He asked her to follow-up with her PCP within the next 48 hours and will plan to recheck a BMET on the day of surgery.  If follow-up labs acceptable and otherwise no acute changes then I anticipate that she can proceed as planned.  Velna Ochsllison Hideo Googe, PA-C [facility]iology Phone [phone] 03/16/2016 12:19 PM

## 2016-03-20 MED ORDER — VANCOMYCIN HCL IN DEXTROSE 1-5 GM/200ML-% IV SOLN
1000.0000 mg | INTRAVENOUS | Status: AC
Start: 2016-03-21 — End: 2016-03-21
  Administered 2016-03-21: 1000 mg via INTRAVENOUS
  Filled 2016-03-20: qty 200

## 2016-03-20 MED ORDER — DEXAMETHASONE SODIUM PHOSPHATE 10 MG/ML IJ SOLN
10.0000 mg | INTRAMUSCULAR | Status: DC
  Filled 2016-03-20: qty 1

## 2016-03-21 ENCOUNTER — Inpatient Hospital Stay (HOSPITAL_COMMUNITY): Payer: Medicare Other | Admitting: Vascular Surgery

## 2016-03-21 ENCOUNTER — Inpatient Hospital Stay (HOSPITAL_COMMUNITY): Payer: Medicare Other

## 2016-03-21 ENCOUNTER — Encounter (HOSPITAL_COMMUNITY)
Admission: RE | Disposition: A | Discharge status: Home / Self Care | Payer: Self-pay | Source: Ambulatory Visit | Attending: Neurosurgery

## 2016-03-21 ENCOUNTER — Inpatient Hospital Stay (HOSPITAL_COMMUNITY)
Admission: RE | Admit: 2016-03-21 | Discharge: 2016-03-22 | DRG: 455 | Disposition: A | Discharge status: Home / Self Care | Payer: Medicare Other | Source: Ambulatory Visit | Attending: Neurosurgery | Admitting: Neurosurgery

## 2016-03-21 ENCOUNTER — Encounter (HOSPITAL_COMMUNITY): Payer: Self-pay | Admitting: Certified Registered Nurse Anesthetist

## 2016-03-21 DIAGNOSIS — F419 Anxiety disorder, unspecified: Secondary | ICD-10-CM | POA: Diagnosis present

## 2016-03-21 DIAGNOSIS — I10 Essential (primary) hypertension: Secondary | ICD-10-CM | POA: Diagnosis present

## 2016-03-21 DIAGNOSIS — M5116 Intervertebral disc disorders with radiculopathy, lumbar region: Secondary | ICD-10-CM | POA: Diagnosis present

## 2016-03-21 DIAGNOSIS — F329 Major depressive disorder, single episode, unspecified: Secondary | ICD-10-CM | POA: Diagnosis present

## 2016-03-21 DIAGNOSIS — Z791 Long term (current) use of non-steroidal anti-inflammatories (NSAID): Secondary | ICD-10-CM

## 2016-03-21 DIAGNOSIS — M4316 Spondylolisthesis, lumbar region: Principal | ICD-10-CM | POA: Diagnosis present

## 2016-03-21 DIAGNOSIS — Z981 Arthrodesis status: Secondary | ICD-10-CM | POA: Diagnosis not present

## 2016-03-21 DIAGNOSIS — M549 Dorsalgia, unspecified: Secondary | ICD-10-CM | POA: Diagnosis present

## 2016-03-21 DIAGNOSIS — G43909 Migraine, unspecified, not intractable, without status migrainosus: Secondary | ICD-10-CM | POA: Diagnosis present

## 2016-03-21 DIAGNOSIS — Z880 Allergy status to penicillin: Secondary | ICD-10-CM

## 2016-03-21 DIAGNOSIS — Z888 Allergy status to other drugs, medicaments and biological substances status: Secondary | ICD-10-CM

## 2016-03-21 DIAGNOSIS — Z87891 Personal history of nicotine dependence: Secondary | ICD-10-CM | POA: Diagnosis not present

## 2016-03-21 DIAGNOSIS — M48062 Spinal stenosis, lumbar region with neurogenic claudication: Secondary | ICD-10-CM | POA: Diagnosis present

## 2016-03-21 DIAGNOSIS — Z881 Allergy status to other antibiotic agents status: Secondary | ICD-10-CM

## 2016-03-21 DIAGNOSIS — Z419 Encounter for procedure for purposes other than remedying health state, unspecified: Secondary | ICD-10-CM

## 2016-03-21 DIAGNOSIS — M4310 Spondylolisthesis, site unspecified: Secondary | ICD-10-CM | POA: Diagnosis present

## 2016-03-21 LAB — BASIC METABOLIC PANEL
ANION GAP: 10 (ref 5–15)
BUN: 15 mg/dL (ref 6–20)
CO2: 26 mmol/L (ref 22–32)
Calcium: 9.5 mg/dL (ref 8.9–10.3)
Chloride: 101 mmol/L (ref 101–111)
Creatinine, Ser: 0.96 mg/dL (ref 0.44–1.00)
GFR calc Af Amer: >60 mL/min (ref >60)
GFR, EST NON AFRICAN AMERICAN: 59 mL/min — AB (ref >60)
GLUCOSE: 92 mg/dL (ref 65–99)
POTASSIUM: 3 mmol/L — AB (ref 3.5–5.1)
Sodium: 137 mmol/L (ref 135–145)

## 2016-03-21 SURGERY — POSTERIOR LUMBAR FUSION 3 LEVEL
Anesthesia: General | Site: Back

## 2016-03-21 MED ORDER — ACETAMINOPHEN 650 MG RE SUPP: 650.0000 mg | RECTAL | Status: DC | PRN

## 2016-03-21 MED ORDER — ACETAMINOPHEN 325 MG PO TABS: 650.0000 mg | ORAL_TABLET | ORAL | Status: DC | PRN

## 2016-03-21 MED ORDER — ESCITALOPRAM OXALATE 10 MG PO TABS
10.0000 mg | ORAL_TABLET | Freq: Every day | ORAL | Status: DC
  Filled 2016-03-21 (×2): qty 1

## 2016-03-21 MED ORDER — MENTHOL 3 MG MT LOZG: 1.0000 | LOZENGE | OROMUCOSAL | Status: DC | PRN

## 2016-03-21 MED ORDER — PROMETHAZINE HCL 25 MG/ML IJ SOLN: 6.2500 mg | INTRAMUSCULAR | Status: DC | PRN

## 2016-03-21 MED ORDER — LACTATED RINGERS IV SOLN: INTRAVENOUS | Status: DC

## 2016-03-21 MED ORDER — ROCURONIUM BROMIDE 100 MG/10ML IV SOLN
INTRAVENOUS | Status: DC | PRN
  Administered 2016-03-21 (×3): 10 mg via INTRAVENOUS
  Administered 2016-03-21: 50 mg via INTRAVENOUS

## 2016-03-21 MED ORDER — POLYVINYL ALCOHOL 1.4 % OP SOLN
1.0000 [drp] | OPHTHALMIC | Status: DC | PRN
  Filled 2016-03-21: qty 15

## 2016-03-21 MED ORDER — VANCOMYCIN HCL 1000 MG IV SOLR
INTRAVENOUS | Status: DC | PRN
  Administered 2016-03-21: 1000 mg via TOPICAL

## 2016-03-21 MED ORDER — OXYCODONE-ACETAMINOPHEN 5-325 MG PO TABS
1.0000 | ORAL_TABLET | ORAL | Status: DC | PRN
  Administered 2016-03-21 – 2016-03-22 (×5): 2 via ORAL
  Filled 2016-03-21 (×5): qty 2

## 2016-03-21 MED ORDER — ARTIFICIAL TEARS OP OINT
TOPICAL_OINTMENT | OPHTHALMIC | Status: AC
  Filled 2016-03-21: qty 7

## 2016-03-21 MED ORDER — BUPIVACAINE HCL (PF) 0.25 % IJ SOLN
INTRAMUSCULAR | Status: AC
  Filled 2016-03-21: qty 30

## 2016-03-21 MED ORDER — MEPERIDINE HCL 25 MG/ML IJ SOLN: 6.2500 mg | INTRAMUSCULAR | Status: DC | PRN

## 2016-03-21 MED ORDER — LORATADINE 10 MG PO TABS
10.0000 mg | ORAL_TABLET | Freq: Every day | ORAL | Status: DC
  Administered 2016-03-21 – 2016-03-22 (×2): 10 mg via ORAL
  Filled 2016-03-21 (×2): qty 1

## 2016-03-21 MED ORDER — PROPOFOL 10 MG/ML IV BOLUS
INTRAVENOUS | Status: DC | PRN
  Administered 2016-03-21: 120 mg via INTRAVENOUS

## 2016-03-21 MED ORDER — SODIUM CHLORIDE 0.9 % IV SOLN: 250.0000 mL | INTRAVENOUS | Status: DC

## 2016-03-21 MED ORDER — ONDANSETRON HCL 4 MG/2ML IJ SOLN
INTRAMUSCULAR | Status: DC | PRN
  Administered 2016-03-21: 4 mg via INTRAVENOUS

## 2016-03-21 MED ORDER — ARTIFICIAL TEARS OP OINT
TOPICAL_OINTMENT | OPHTHALMIC | Status: DC | PRN
  Administered 2016-03-21: 1 via OPHTHALMIC

## 2016-03-21 MED ORDER — PHENYLEPHRINE HCL 10 MG/ML IJ SOLN
INTRAVENOUS | Status: DC | PRN
  Administered 2016-03-21: 25 ug/min via INTRAVENOUS

## 2016-03-21 MED ORDER — PHENYLEPHRINE 40 MCG/ML (10ML) SYRINGE FOR IV PUSH (FOR BLOOD PRESSURE SUPPORT)
PREFILLED_SYRINGE | INTRAVENOUS | Status: AC
  Filled 2016-03-21: qty 10

## 2016-03-21 MED ORDER — CLONAZEPAM 0.5 MG PO TABS: 0.5000 mg | ORAL_TABLET | Freq: Every day | ORAL | Status: DC | PRN

## 2016-03-21 MED ORDER — VANCOMYCIN HCL IN DEXTROSE 1-5 GM/200ML-% IV SOLN
1000.0000 mg | Freq: Once | INTRAVENOUS | Status: AC
  Administered 2016-03-21: 1000 mg via INTRAVENOUS
  Filled 2016-03-21: qty 200

## 2016-03-21 MED ORDER — EPHEDRINE 5 MG/ML INJ
INTRAVENOUS | Status: AC
  Filled 2016-03-21: qty 10

## 2016-03-21 MED ORDER — DEXAMETHASONE SODIUM PHOSPHATE 10 MG/ML IJ SOLN
INTRAMUSCULAR | Status: DC | PRN
  Administered 2016-03-21: 10 mg via INTRAVENOUS

## 2016-03-21 MED ORDER — THROMBIN 20000 UNITS EX SOLR
CUTANEOUS | Status: AC
  Filled 2016-03-21: qty 20000

## 2016-03-21 MED ORDER — ACETAMINOPHEN 10 MG/ML IV SOLN
INTRAVENOUS | Status: DC | PRN
  Administered 2016-03-21: 1000 mg via INTRAVENOUS

## 2016-03-21 MED ORDER — CHLORHEXIDINE GLUCONATE CLOTH 2 % EX PADS: 6.0000 | MEDICATED_PAD | Freq: Once | CUTANEOUS | Status: DC

## 2016-03-21 MED ORDER — FENTANYL CITRATE (PF) 100 MCG/2ML IJ SOLN
INTRAMUSCULAR | Status: AC
  Filled 2016-03-21: qty 4

## 2016-03-21 MED ORDER — SODIUM CHLORIDE 0.9 % IV SOLN
INTRAVENOUS | Status: DC | PRN
  Administered 2016-03-21: 12:00:00 via INTRAVENOUS

## 2016-03-21 MED ORDER — PHENOL 1.4 % MT LIQD
1.0000 | OROMUCOSAL | Status: DC | PRN
Start: 2016-03-21 — End: 2016-03-22

## 2016-03-21 MED ORDER — SODIUM CHLORIDE 0.9 % IR SOLN
Status: DC | PRN
  Administered 2016-03-21: 09:00:00

## 2016-03-21 MED ORDER — DIAZEPAM 5 MG PO TABS
5.0000 mg | ORAL_TABLET | Freq: Four times a day (QID) | ORAL | Status: DC | PRN
  Administered 2016-03-21 – 2016-03-22 (×2): 5 mg via ORAL
  Filled 2016-03-21 (×2): qty 1

## 2016-03-21 MED ORDER — SUGAMMADEX SODIUM 200 MG/2ML IV SOLN
INTRAVENOUS | Status: DC | PRN
  Administered 2016-03-21: 200 mg via INTRAVENOUS

## 2016-03-21 MED ORDER — FENTANYL CITRATE (PF) 100 MCG/2ML IJ SOLN
INTRAMUSCULAR | Status: AC
  Filled 2016-03-21: qty 2

## 2016-03-21 MED ORDER — 0.9 % SODIUM CHLORIDE (POUR BTL) OPTIME
TOPICAL | Status: DC | PRN
  Administered 2016-03-21: 1000 mL

## 2016-03-21 MED ORDER — CLINDAMYCIN HCL 300 MG PO CAPS: 300.0000 mg | ORAL_CAPSULE | Freq: Every day | ORAL | Status: DC | PRN

## 2016-03-21 MED ORDER — LIDOCAINE 2% (20 MG/ML) 5 ML SYRINGE
INTRAMUSCULAR | Status: AC
  Filled 2016-03-21: qty 5

## 2016-03-21 MED ORDER — HYDROCODONE-ACETAMINOPHEN 5-325 MG PO TABS: 1.0000 | ORAL_TABLET | ORAL | Status: DC | PRN

## 2016-03-21 MED ORDER — CARBOXYMETHYLCELLUL-GLYCERIN 0.5-0.9 % OP SOLN: Freq: Every day | OPHTHALMIC | Status: DC | PRN

## 2016-03-21 MED ORDER — BACLOFEN 10 MG PO TABS
10.0000 mg | ORAL_TABLET | Freq: Two times a day (BID) | ORAL | Status: DC
  Administered 2016-03-21 – 2016-03-22 (×2): 10 mg via ORAL
  Filled 2016-03-21 (×2): qty 1

## 2016-03-21 MED ORDER — ROSUVASTATIN CALCIUM 5 MG PO TABS
5.0000 mg | ORAL_TABLET | ORAL | Status: DC
  Administered 2016-03-21: 5 mg via ORAL
  Filled 2016-03-21: qty 1

## 2016-03-21 MED ORDER — ALBUMIN HUMAN 5 % IV SOLN
INTRAVENOUS | Status: DC | PRN
  Administered 2016-03-21: 11:00:00 via INTRAVENOUS

## 2016-03-21 MED ORDER — ACETAMINOPHEN 10 MG/ML IV SOLN
INTRAVENOUS | Status: AC
  Filled 2016-03-21: qty 100

## 2016-03-21 MED ORDER — MIDAZOLAM HCL 5 MG/5ML IJ SOLN
INTRAMUSCULAR | Status: DC | PRN
  Administered 2016-03-21: 1 mg via INTRAVENOUS

## 2016-03-21 MED ORDER — DEXAMETHASONE SODIUM PHOSPHATE 10 MG/ML IJ SOLN
INTRAMUSCULAR | Status: AC
  Filled 2016-03-21: qty 1

## 2016-03-21 MED ORDER — HYDROMORPHONE HCL 1 MG/ML IJ SOLN
0.5000 mg | INTRAMUSCULAR | Status: DC | PRN
  Administered 2016-03-21: 1 mg via INTRAVENOUS
  Filled 2016-03-21: qty 1

## 2016-03-21 MED ORDER — CHLORTHALIDONE 25 MG PO TABS
25.0000 mg | ORAL_TABLET | Freq: Every day | ORAL | Status: DC
  Administered 2016-03-22: 25 mg via ORAL
  Filled 2016-03-21: qty 1

## 2016-03-21 MED ORDER — PROPOFOL 10 MG/ML IV BOLUS
INTRAVENOUS | Status: AC
  Filled 2016-03-21: qty 40

## 2016-03-21 MED ORDER — ONDANSETRON HCL 4 MG/2ML IJ SOLN
INTRAMUSCULAR | Status: AC
  Filled 2016-03-21: qty 2

## 2016-03-21 MED ORDER — HYDROMORPHONE HCL 2 MG/ML IJ SOLN
INTRAMUSCULAR | Status: AC
  Filled 2016-03-21: qty 1

## 2016-03-21 MED ORDER — HYDROMORPHONE HCL 1 MG/ML IJ SOLN
0.2500 mg | INTRAMUSCULAR | Status: DC | PRN
  Administered 2016-03-21 (×2): 0.5 mg via INTRAVENOUS

## 2016-03-21 MED ORDER — THROMBIN 20000 UNITS EX SOLR
CUTANEOUS | Status: DC | PRN
  Administered 2016-03-21: 09:00:00 via TOPICAL

## 2016-03-21 MED ORDER — ONDANSETRON HCL 4 MG/2ML IJ SOLN: 4.0000 mg | INTRAMUSCULAR | Status: DC | PRN

## 2016-03-21 MED ORDER — VANCOMYCIN HCL 1000 MG IV SOLR
INTRAVENOUS | Status: AC
  Filled 2016-03-21: qty 1000

## 2016-03-21 MED ORDER — SUMATRIPTAN SUCCINATE 50 MG PO TABS: 100.0000 mg | ORAL_TABLET | ORAL | Status: DC | PRN

## 2016-03-21 MED ORDER — SODIUM CHLORIDE 0.9% FLUSH
3.0000 mL | Freq: Two times a day (BID) | INTRAVENOUS | Status: DC
  Administered 2016-03-21: 3 mL via INTRAVENOUS

## 2016-03-21 MED ORDER — SODIUM CHLORIDE 0.9% FLUSH: 3.0000 mL | INTRAVENOUS | Status: DC | PRN

## 2016-03-21 MED ORDER — MELOXICAM 7.5 MG PO TABS
15.0000 mg | ORAL_TABLET | Freq: Every day | ORAL | Status: DC
  Administered 2016-03-22: 15 mg via ORAL
  Filled 2016-03-21 (×2): qty 1

## 2016-03-21 MED ORDER — ROCURONIUM BROMIDE 50 MG/5ML IV SOSY
PREFILLED_SYRINGE | INTRAVENOUS | Status: AC
  Filled 2016-03-21: qty 10

## 2016-03-21 MED ORDER — MIDAZOLAM HCL 2 MG/2ML IJ SOLN
INTRAMUSCULAR | Status: AC
  Filled 2016-03-21: qty 2

## 2016-03-21 MED ORDER — LACTATED RINGERS IV SOLN
INTRAVENOUS | Status: DC | PRN
  Administered 2016-03-21 (×2): via INTRAVENOUS

## 2016-03-21 MED ORDER — ROCURONIUM BROMIDE 50 MG/5ML IV SOSY
PREFILLED_SYRINGE | INTRAVENOUS | Status: AC
  Filled 2016-03-21: qty 5

## 2016-03-21 MED ORDER — RIZATRIPTAN BENZOATE 10 MG PO TBDP: 10.0000 mg | ORAL_TABLET | ORAL | Status: DC | PRN

## 2016-03-21 MED ORDER — FENTANYL CITRATE (PF) 100 MCG/2ML IJ SOLN
INTRAMUSCULAR | Status: DC | PRN
  Administered 2016-03-21 (×2): 50 ug via INTRAVENOUS
  Administered 2016-03-21: 100 ug via INTRAVENOUS
  Administered 2016-03-21 (×2): 50 ug via INTRAVENOUS

## 2016-03-21 MED ORDER — PHENYLEPHRINE HCL 10 MG/ML IJ SOLN
INTRAMUSCULAR | Status: DC | PRN
  Administered 2016-03-21 (×2): 40 ug via INTRAVENOUS

## 2016-03-21 MED ORDER — LIDOCAINE HCL (CARDIAC) 20 MG/ML IV SOLN
INTRAVENOUS | Status: DC | PRN
  Administered 2016-03-21: 60 mg via INTRAVENOUS

## 2016-03-21 MED ORDER — BUPIVACAINE HCL (PF) 0.25 % IJ SOLN
INTRAMUSCULAR | Status: DC | PRN
  Administered 2016-03-21: 30 mL

## 2016-03-21 MED FILL — Sodium Chloride IV Soln 0.9%: INTRAVENOUS | Qty: 2000 | Status: AC

## 2016-03-21 MED FILL — Heparin Sodium (Porcine) Inj 1000 Unit/ML: INTRAMUSCULAR | Qty: 30 | Status: AC

## 2016-03-21 SURGICAL SUPPLY — 69 items
ADH SKN CLS APL DERMABOND .7 (GAUZE/BANDAGES/DRESSINGS) ×2
APL SKNCLS STERI-STRIP NONHPOA (GAUZE/BANDAGES/DRESSINGS) ×1
BAG DECANTER FOR FLEXI CONT (MISCELLANEOUS) ×2 IMPLANT
BENZOIN TINCTURE PRP APPL 2/3 (GAUZE/BANDAGES/DRESSINGS) ×2 IMPLANT
BLADE CLIPPER SURG (BLADE) IMPLANT
BUR CUTTER 7.0 ROUND (BURR) ×1 IMPLANT
BUR MATCHSTICK NEURO 3.0 LAGG (BURR) ×2 IMPLANT
CANISTER SUCT 3000ML PPV (MISCELLANEOUS) ×2 IMPLANT
CAP LCK SPNE (Orthopedic Implant) ×8 IMPLANT
CAP LOCK SPINE RADIUS (Orthopedic Implant) IMPLANT
CAP LOCKING (Orthopedic Implant) ×16 IMPLANT
CARTRIDGE OIL MAESTRO DRILL (MISCELLANEOUS) ×1 IMPLANT
CONT SPEC 4OZ CLIKSEAL STRL BL (MISCELLANEOUS) ×2 IMPLANT
COVER BACK TABLE 60X90IN (DRAPES) ×2 IMPLANT
DECANTER SPIKE VIAL GLASS SM (MISCELLANEOUS) ×2 IMPLANT
DERMABOND ADVANCED (GAUZE/BANDAGES/DRESSINGS) ×2
DERMABOND ADVANCED .7 DNX12 (GAUZE/BANDAGES/DRESSINGS) ×1 IMPLANT
DEVICE INTERBODY ELEVATE 23X7 (Cage) ×6 IMPLANT
DIFFUSER DRILL AIR PNEUMATIC (MISCELLANEOUS) ×2 IMPLANT
DRAPE C-ARM 42X72 X-RAY (DRAPES) ×4 IMPLANT
DRAPE HALF SHEET 40X57 (DRAPES) IMPLANT
DRAPE LAPAROTOMY 100X72X124 (DRAPES) ×2 IMPLANT
DRAPE POUCH INSTRU U-SHP 10X18 (DRAPES) ×2 IMPLANT
DRAPE SURG 17X23 STRL (DRAPES) ×8 IMPLANT
DRSG OPSITE POSTOP 4X8 (GAUZE/BANDAGES/DRESSINGS) ×1 IMPLANT
DURAPREP 26ML APPLICATOR (WOUND CARE) ×2 IMPLANT
ELECT REM PT RETURN 9FT ADLT (ELECTROSURGICAL) ×2
ELECTRODE REM PT RTRN 9FT ADLT (ELECTROSURGICAL) ×1 IMPLANT
EVACUATOR 1/8 PVC DRAIN (DRAIN) ×1 IMPLANT
GAUZE SPONGE 4X4 12PLY STRL (GAUZE/BANDAGES/DRESSINGS) ×1 IMPLANT
GAUZE SPONGE 4X4 16PLY XRAY LF (GAUZE/BANDAGES/DRESSINGS) IMPLANT
GLOVE BIOGEL PI IND STRL 7.5 (GLOVE) IMPLANT
GLOVE BIOGEL PI IND STRL 8 (GLOVE) IMPLANT
GLOVE BIOGEL PI INDICATOR 7.5 (GLOVE) ×2
GLOVE BIOGEL PI INDICATOR 8 (GLOVE) ×6
GLOVE ECLIPSE 7.5 STRL STRAW (GLOVE) ×3 IMPLANT
GLOVE ECLIPSE 9.0 STRL (GLOVE) ×4 IMPLANT
GLOVE EXAM NITRILE LRG STRL (GLOVE) IMPLANT
GLOVE EXAM NITRILE XL STR (GLOVE) IMPLANT
GLOVE EXAM NITRILE XS STR PU (GLOVE) IMPLANT
GLOVE SS BIOGEL STRL SZ 7.5 (GLOVE) IMPLANT
GLOVE SUPERSENSE BIOGEL SZ 7.5 (GLOVE) ×1
GLOVE SURG SS PI 8.0 STRL IVOR (GLOVE) ×2 IMPLANT
GOWN STRL REUS W/ TWL LRG LVL3 (GOWN DISPOSABLE) IMPLANT
GOWN STRL REUS W/ TWL XL LVL3 (GOWN DISPOSABLE) ×2 IMPLANT
GOWN STRL REUS W/TWL 2XL LVL3 (GOWN DISPOSABLE) ×3 IMPLANT
GOWN STRL REUS W/TWL LRG LVL3 (GOWN DISPOSABLE) ×2
GOWN STRL REUS W/TWL XL LVL3 (GOWN DISPOSABLE) ×4
KIT BASIN OR (CUSTOM PROCEDURE TRAY) ×2 IMPLANT
KIT ROOM TURNOVER OR (KITS) ×2 IMPLANT
NEEDLE HYPO 22GX1.5 SAFETY (NEEDLE) ×2 IMPLANT
NS IRRIG 1000ML POUR BTL (IV SOLUTION) ×2 IMPLANT
OIL CARTRIDGE MAESTRO DRILL (MISCELLANEOUS) ×2
PACK LAMINECTOMY NEURO (CUSTOM PROCEDURE TRAY) ×2 IMPLANT
ROD 110MM (Rod) ×4 IMPLANT
ROD SPNL 110X5.5XNS TI RDS (Rod) IMPLANT
SCREW 5.75X40M (Screw) ×8 IMPLANT
SPONGE SURGIFOAM ABS GEL 100 (HEMOSTASIS) ×2 IMPLANT
STRIP CLOSURE SKIN 1/2X4 (GAUZE/BANDAGES/DRESSINGS) ×4 IMPLANT
SUT STRATAFIX 1PDS 45CM VIOLET (SUTURE) ×1 IMPLANT
SUT STRATAFIX SPIRAL + 2-0 (SUTURE) ×1 IMPLANT
SUT VIC AB 0 CT1 18XCR BRD8 (SUTURE) ×2 IMPLANT
SUT VIC AB 0 CT1 8-18 (SUTURE) ×4
SUT VIC AB 2-0 CT1 18 (SUTURE) ×4 IMPLANT
SUT VIC AB 3-0 SH 8-18 (SUTURE) ×4 IMPLANT
TOWEL OR 17X24 6PK STRL BLUE (TOWEL DISPOSABLE) ×2 IMPLANT
TOWEL OR 17X26 10 PK STRL BLUE (TOWEL DISPOSABLE) ×2 IMPLANT
TRAY FOLEY W/METER SILVER 16FR (SET/KITS/TRAYS/PACK) ×3 IMPLANT
WATER STERILE IRR 1000ML POUR (IV SOLUTION) ×2 IMPLANT

## 2016-03-21 NOTE — Anesthesia Preprocedure Evaluation (Addendum)
Anesthesia Evaluation  Patient identified by MRN, date of birth, ID band Patient awake    Reviewed: Allergy & Precautions, NPO status , Patient's Chart, lab work & pertinent test results  Airway Mallampati: II       Dental  (+) Teeth Intact, Dental Advisory Given   Pulmonary former smoker,    breath sounds clear to auscultation       Cardiovascular hypertension,  Rhythm:Regular Rate:Normal     Neuro/Psych  Headaches, PSYCHIATRIC DISORDERS Anxiety Depression    GI/Hepatic negative GI ROS, Neg liver ROS,   Endo/Other  negative endocrine ROS  Renal/GU negative Renal ROS  negative genitourinary   Musculoskeletal  (+) Arthritis , Osteoarthritis,    Abdominal   Peds negative pediatric ROS (+)  Hematology negative hematology ROS (+)   Anesthesia Other Findings   Reproductive/Obstetrics negative OB ROS                            Lab Results  Component Value Date   WBC 9.8 03/15/2016   HGB 14.8 03/15/2016   HCT 43.0 03/15/2016   MCV 88.8 03/15/2016   PLT 225 03/15/2016   Lab Results  Component Value Date   CREATININE 0.92 03/15/2016   BUN 13 03/15/2016   NA 136 03/15/2016   K 2.7 (LL) 03/15/2016   CL 95 (L) 03/15/2016   CO2 28 03/15/2016   No results found for: INR, PROTIME  EKG: normal sinus rhythm.  Anesthesia Physical Anesthesia Plan  ASA: II  Anesthesia Plan: General   Post-op Pain Management:    Induction: Intravenous  Airway Management Planned: Oral ETT  Additional Equipment:   Intra-op Plan:   Post-operative Plan: Extubation in OR  Informed Consent: I have reviewed the patients History and Physical, chart, labs and discussed the procedure including the risks, benefits and alternatives for the proposed anesthesia with the patient or authorized representative who has indicated his/her understanding and acceptance.   Dental advisory given  Plan Discussed with:  CRNA  Anesthesia Plan Comments:         Anesthesia Quick Evaluation

## 2016-03-21 NOTE — Brief Op Note (Signed)
03/21/2016  11:59 AM  PATIENT:  Beverlee NimsJanet S Zenz  71 y.o. female  PRE-OPERATIVE DIAGNOSIS:  Stenosis - lumbar  POST-OPERATIVE DIAGNOSIS:  Stenosis - lumbar  PROCEDURE:  Procedure(s): Posterior Lumbar Interbody Fusion Lumbar two-three, Lumbar three-four, Lumbar four-five (N/A)  SURGEON:  Surgeon(s) and Role:    * Julio SicksHenry Semir Brill, MD - Primary    * Loura HaltBenjamin Jared Ditty, MD - Assisting  PHYSICIAN ASSISTANT:   ASSISTANTS:    ANESTHESIA:   general  EBL:  Total I/O In: 1875 [I.V.:1500; Blood:125; IV Piggyback:250] Out: 800 [Urine:300; Blood:500]  BLOOD ADMINISTERED:none  DRAINS: none   LOCAL MEDICATIONS USED:  MARCAINE     SPECIMEN:  No Specimen  DISPOSITION OF SPECIMEN:  N/A  COUNTS:  YES  TOURNIQUET:  * No tourniquets in log *  DICTATION: .Dragon Dictation  PLAN OF CARE: Admit to inpatient   PATIENT DISPOSITION:  PACU - hemodynamically stable.   Delay start of Pharmacological VTE agent (>24hrs) due to surgical blood loss or risk of bleeding: yes

## 2016-03-21 NOTE — H&P (Signed)
Carol NimsJanet S [name] is an 71 y.o. female.   Chief Complaint: Back pain HPI: 71 year old female with progressive back pain and radiation to both lower extremities right greater than left. Workup demonstrates evidence of marked multilevel spondylosis with progressive disc degeneration and resultant retrolisthesis and stenosis at L2-3, L3-4 and L4-5. Patient is failed conservative management presents now for lumbar decompression and fusion surgery in hopes of improving her symptoms.  Past Medical History:  Diagnosis Date  . Anxiety   . Arthritis   . Depression   . Headache    migraine - last one 2016  . Hypertension    reason that she was given Hygroton & she remarks that her BP has been better she has been taking it.   Marland Kitchen. Positive skin test for tuberculosis    told that she is a carrier,     Past Surgical History:  Procedure Laterality Date  . ABDOMINAL HYSTERECTOMY    . BACK SURGERY     x3 previous cervical fusion & then removed hardware   . BREAST SURGERY Right    benign-   . HAND TENDON SURGERY Right 2016  . JOINT REPLACEMENT Right    also had a partial on the L   . MYRINGOTOMY WITH TUBE PLACEMENT Right   . TONSILLECTOMY    . TUBAL LIGATION     had a 2nd. surgery for adhesions    History reviewed. No pertinent family history. Social History:  reports that she quit smoking about 40 years ago. She has never used smokeless tobacco. She reports that she uses drugs, including Marijuana, about 1 time per week. She reports that she does not drink alcohol.  Allergies:  Allergies  Allergen Reactions  . Hydralazine Nausea And Vomiting  . Ibandronic Acid Other (See Comments)    cracked teeth and joint pain  . Penicillins Hives    Has patient had a PCN reaction causing immediate rash, facial/tongue/throat swelling, SOB or lightheadedness with hypotension: No Has patient had a PCN reaction causing severe rash involving mucus membranes or skin necrosis: Yes Has patient had a PCN reaction  that required hospitalization No Has patient had a PCN reaction occurring within the last 10 years: No If all of the above answers are "NO", then may proceed with Cephalosporin use.   . Gentamycin [Gentamicin] Rash    Medications Prior to Admission  Medication Sig Dispense Refill  . baclofen (LIORESAL) 10 MG tablet Take 10 mg by mouth 2 (two) times daily.    . Carboxymethylcellul-Glycerin (LUBRICATING EYE DROPS OP) Apply 1 drop to eye daily as needed (dry).    . cetirizine (ZYRTEC) 10 MG tablet Take 10 mg by mouth daily.    . chlorthalidone (HYGROTON) 25 MG tablet Take 25 mg by mouth daily before breakfast.     . clonazePAM (KLONOPIN) 0.5 MG tablet Take 0.5 mg by mouth daily as needed for anxiety.    . docusate sodium (COLACE) 100 MG capsule Take 200 mg by mouth at bedtime.    . Doxylamine Succinate, Sleep, (SLEEP AID PO) Take 1 tablet by mouth at bedtime as needed (sleep).    Marland Kitchen. escitalopram (LEXAPRO) 10 MG tablet Take 10 mg by mouth daily after breakfast.     . oxymetazoline (AFRIN) 0.05 % nasal spray Place 1 spray into both nostrils daily as needed for congestion.    . rosuvastatin (CRESTOR) 5 MG tablet Take 5 mg by mouth every other day.    . clindamycin (CLEOCIN) 300 MG capsule Take 300  mg by mouth daily as needed (dental visit).    . meloxicam (MOBIC) 15 MG tablet Take 15 mg by mouth daily.    . rizatriptan (MAXALT-MLT) 10 MG disintegrating tablet Take 10 mg by mouth as needed for migraine. May repeat in 2 hours if needed    . SUMAtriptan (IMITREX) 100 MG tablet Take 100 mg by mouth every 2 (two) hours as needed for migraine. May repeat in 2 hours if headache persists or recurs.      No results found for this or any previous visit (from the past 48 hour(s)). No results found.  Pertinent items noted in HPI and remainder of comprehensive ROS otherwise negative.  Blood pressure 109/64, pulse 71, temperature 98.1 F (36.7 C), temperature source Oral, resp. rate 18, SpO2 96  %.  Patient is awake and alert. She is oriented and appropriate. Her cranial nerve function is intact. Her speech is fluent. Judgment and insight are intact. Motor examination 5/5 bilaterally. Sensory examination nonfocal. Straight raising is equivocal bilaterally. Reflexes hypoactive in both lower extremities but symmetric. Gait is antalgic. Posture is mildly flexed. Examination head ears eyes and throat are marked. Chest and abdomen are benign. Extremities are free from injury deformity.  Assessment/Plan L2-3, L3-4, L4-5 degenerative spondylolisthesis with stenosis and neurogenic claudication. Plan L2-3, L3-4, L4-5 bilateral decompressive laminotomies and foraminotomies followed by posterior lumbar interbody fusion utilizing interbody expandable cages, locally harvested autograft, and augmented with posterior lateral arthrodesis utilizing segmental pedicle screw fixation and local autografting. Risks and benefits of been explained. Patient wishes to proceed.  [name] A 03/21/2016, 7:52 AM

## 2016-03-21 NOTE — Anesthesia Postprocedure Evaluation (Signed)
Anesthesia Post Note  Patient: Beverlee NimsJanet S Sivertsen  Procedure(s) Performed: Procedure(s) (LRB): Posterior Lumbar Interbody Fusion Lumbar two-three, Lumbar three-four, Lumbar four-five (N/A)  Patient location during evaluation: PACU Anesthesia Type: General Level of consciousness: awake and alert Pain management: pain level controlled Vital Signs Assessment: post-procedure vital signs reviewed and stable Respiratory status: spontaneous breathing, nonlabored ventilation, respiratory function stable and patient connected to nasal cannula oxygen Cardiovascular status: blood pressure returned to baseline and stable Postop Assessment: no signs of nausea or vomiting Anesthetic complications: no       Last Vitals:  Vitals:   03/21/16 1345 03/21/16 1414  BP: 124/71 138/74  Pulse: 85 85  Resp: 10 15  Temp:  36.1 C    Last Pain:  Vitals:   03/21/16 1450  TempSrc:   PainSc: 10-Worst pain ever                 Shelton SilvasKevin D Jamisyn Langer

## 2016-03-21 NOTE — Anesthesia Procedure Notes (Signed)
Procedure Name: Intubation Date/Time: 03/21/2016 8:03 AM Performed by: Candis Shine Pre-anesthesia Checklist: Patient identified, Emergency Drugs available, Suction available and Patient being monitored Patient Re-evaluated:Patient Re-evaluated prior to inductionOxygen Delivery Method: Circle System Utilized Preoxygenation: Pre-oxygenation with 100% oxygen Intubation Type: IV induction Ventilation: Mask ventilation without difficulty Laryngoscope Size: Mac and 3 Grade View: Grade I Tube type: Oral Tube size: 7.5 mm Number of attempts: 1 Airway Equipment and Method: Stylet Placement Confirmation: ETT inserted through vocal cords under direct vision,  positive ETCO2 and breath sounds checked- equal and bilateral Secured at: 22 cm Tube secured with: Tape Dental Injury: Teeth and Oropharynx as per pre-operative assessment

## 2016-03-21 NOTE — Op Note (Signed)
Date of procedure: 03/21/2016  Date of dictation: Same  Service: Neurosurgery  Preoperative diagnosis: L2-3, L3-4, L4-5 degenerative spondylolisthesis with stenosis  Postoperative diagnosis: Same  Procedure Name: Bilateral L2-3, L3-4, L4-5 decompressive laminotomies with bilateral L2, L3, L4, L5 decompressive foraminotomies, in excess of what would be required for simple interbody fusion alone.  L2-3, L3-4, L4-5 posterior lumbar interbody fusion utilizing interbody expandable cages, locally harvested autograft,  L2-3 45 posterior lateral arthrodesis utilizing segmental pedicle screw fixation and local autograft.  Surgeon:Hanne Kegg A.Sherrel Shafer, M.D.  Asst. Surgeon: Ditty  Anesthesia: General  Indication: 71 year old female with intractable back and bilateral lower extremity symptoms consistent with radiculopathy and neurogenic claudication. Workup demonstrates evidence of marked multilevel disc degeneration which has progressively worsened over time. She has significant retrolisthesis and stenosis at L2-3, L3-4 and L4-5. Patient is failed conservative management and presents now for decompression and fusion in hopes of improving her symptoms.  Operative note: After induction of anesthesia, patient position prone onto Wilson frame and a properly padded. Lumbar region prepped and draped sterilely. Incision made overlying L2-L5. Dissection performed bilaterally. Retractor placed. Fluoroscopy used. Levels confirmed. Decompressive laminotomies and foraminotomies were performed bilaterally at L2-3, L3-4 and L4-5 using Leksell rongeurs and Kerrison rongeurs to remove the inferior aspect of the lamina above the entire inferior facet, the majority of the superior facet and a small amount of the superior aspect of the lamina below. Ligament flavum was elevated and resected all 3 levels. Decompressive foraminotomies were performed on the course exiting L2, L3, L4 and L5 nerve roots. Bilateral discectomies were  then performed using pituitary rongeurs and various curettes. Disc spaces were then prepared for interbody fusion. Disc space was distracted on the contralateral side and on the patient's left side the disc space was sized and a 7 mm expandable lordotic cage from [employer] was packed with locally harvested autograft and impacted into place. Is expanded to its full extent. This procedure was repeated at L3-4 and L4-5 and then repeated on the contralateral side after removing the distractor. Prior to removing the second cage morselized autograft was further packed into the interspace for later fusion. The cages on the right side were also expanded. Pedicles of L2 L3 L4 and L5 were then verified using surface landmarks and intraoperative fluoroscopy. 2 partial bone overlying the pedicle was removed using high-speed drill. Each pedicles and probed using a pedicle awl each pedicle awl track was then probed and found to be solidly within the bone. Each pedicle was then tapped with a screw tap and then a 5.75 x 40 mm radius brand screws from [employer] were placed bilaterally at L2 L3 L4 and L5. Screws and cages were fitted confirmed to be in good position both AP and lateral planes with fluoroscopy. Wound is then irrigated with and like solution. Short segment titanium rods and placed or the screw heads from L2-L5. Locking caps were placed over the screws. Locking caps and engaged. Transverse processes were decorticated. Morselize autograft was packed posterior laterally for later fusion. Vancomycin powder was left in the deep wound space. Laminotomies were covered with Gelfoam. Wounds and close in layers. Steri-Strips and sterile dressing were applied. No apparent complications. Patient tolerated procedure well and she returns to the recovery room postop.

## 2016-03-21 NOTE — Progress Notes (Signed)
Physical Therapy Evaluation Patient Details Name: Carol [name] MRN: [id number] DOB: 09-Mar-1946 Today's Date: 03/21/2016   History of Present Illness  Pt to surgery for Bilateral L2-3, L3-4, L4-5 decompressive laminotomies with bilateral L2, L3, L4, L5 decompressive foraminotomies, in excess of what would be required for simple interbody fusion alone (03/21/2016). PMH of HTN, HA, anxiety, R TKA, L Partial Knee replacement, Breast surgery, back surgery (previous cervical fusion).  Clinical Impression  PTA, pt was independent with all ADLs (bathing, dressing) and community mobility without an assistive device. Pt currently lives with husband who will be available to help after d/c. Pt with good compliance of back precautions today. Pt able to demonstrate bed mobility with supervision to min guard for safety and reported dizziness upon sitting EOB. BP 118/74. Pt reported that dizziness subsided and stood for 5-10 seconds before reporting dizziness and nausea upon standing. Pt sat down immediately and was assisted back to supine position (with min assist for LE mobility). Pt is a good candidate for HHPT to address deficits in mobility and strength to assist in return to PLOF. PT will continue to follow acutely.     Follow Up Recommendations Home health PT;Supervision - Intermittent    Equipment Recommendations  None recommended by PT    Recommendations for Other Services       Precautions / Restrictions Precautions Precautions: Back Precaution Booklet Issued: Yes (comment) Precaution Comments: Handout given and reviewed Required Braces or Orthoses: Spinal Brace Spinal Brace: Lumbar corset Restrictions Weight Bearing Restrictions: No      Mobility  Bed Mobility Overal bed mobility: Needs Assistance Bed Mobility: Rolling;Sidelying to Sit Rolling: Supervision Sidelying to sit: Min guard       General bed mobility comments: Pt able to roll to side without physical assist. Required min  guard for safety for sidelying to sit. No physical assist required. Pt light-headed and dizzy upon sitting EOB. BP WNL and pt reported that dizziness subsided.  Transfers Overall transfer level: Needs assistance Equipment used: Rolling walker (2 wheeled) Transfers: Sit to/from Stand Sit to Stand: Min guard         General transfer comment: Pt able to power to stand without physical assist. Nauseous and dizzy upon standing. Sat down after 5-10 seconds of standing 2/2 nausea.  Ambulation/Gait                Stairs            Wheelchair Mobility    Modified Rankin (Stroke Patients Only)       Balance Overall balance assessment: Needs assistance Sitting-balance support: Feet supported;Single extremity supported Sitting balance-Leahy Scale: Fair     Standing balance support: Bilateral upper extremity supported;During functional activity Standing balance-Leahy Scale: Poor Standing balance comment: Pt reliant on RW for standing balance                             Pertinent Vitals/Pain Pain Assessment: 0-10 Pain Score: 6  Pain Location: back Pain Descriptors / Indicators: Aching;Grimacing;Guarding Pain Intervention(s): Limited activity within patient's tolerance;Monitored during session    Home Living Family/patient expects to be discharged to:: Private residence Living Arrangements: Spouse/significant other   Type of Home: House Home Access: Stairs to enter Entrance Stairs-Rails: Left;Right (only on second set of steps) Entrance Stairs-Number of Steps: 6 (3 steps then another 3 steps) Home Layout: One level Home Equipment: Walker - 2 wheels;Shower seat      Prior Function Level  of Independence: Independent               Hand Dominance        Extremity/Trunk Assessment   Upper Extremity Assessment Upper Extremity Assessment: Overall WFL for tasks assessed    Lower Extremity Assessment Lower Extremity Assessment: Overall WFL for  tasks assessed       Communication   Communication: No difficulties  Cognition Arousal/Alertness: Lethargic (likely 2/2 anesthesia) Behavior During Therapy: WFL for tasks assessed/performed Overall Cognitive Status: Within Functional Limits for tasks assessed                      General Comments General comments (skin integrity, edema, etc.): Pt nauseous during session. RN notified    Exercises     Assessment/Plan    PT Assessment Patient needs continued PT services  PT Problem List Decreased strength;Decreased range of motion;Decreased activity tolerance;Decreased balance;Decreased mobility;Decreased knowledge of use of DME;Decreased safety awareness;Decreased knowledge of precautions;Pain          PT Treatment Interventions DME instruction;Gait training;Stair training;Functional mobility training;Therapeutic activities;Therapeutic exercise;Balance training;Patient/family education    PT Goals (Current goals can be found in the Care Plan section)  Acute Rehab PT Goals Patient Stated Goal: to walk PT Goal Formulation: With patient Time For Goal Achievement: 04/04/16 Potential to Achieve Goals: Good    Frequency 7X/week   Barriers to discharge        Co-evaluation               End of Session Equipment Utilized During Treatment: Gait belt;Back brace Activity Tolerance: Patient limited by fatigue;Patient limited by lethargy Patient left: in bed;with call bell/phone within reach;with family/visitor present Nurse Communication: Mobility status         Time: 0981-1914 PT Time Calculation (min) (ACUTE ONLY): 31 min   Charges:   PT Evaluation $PT Eval Moderate Complexity: 1 Procedure PT Treatments $Self Care/Home Management: 8-22   PT G Codes:        Gaye Pollack 2016-04-18, 4:51 PM Gaye Pollack, SPT [phone]

## 2016-03-21 NOTE — Transfer of Care (Signed)
Immediate Anesthesia Transfer of Care Note  Patient: Carol NimsJanet S Mohammad  Procedure(s) Performed: Procedure(s): Posterior Lumbar Interbody Fusion Lumbar two-three, Lumbar three-four, Lumbar four-five (N/A)  Patient Location: PACU  Anesthesia Type:General  Level of Consciousness: awake, alert  and oriented  Airway & Oxygen Therapy: Patient Spontanous Breathing and Patient connected to nasal cannula oxygen  Post-op Assessment: Report given to RN and Post -op Vital signs reviewed and stable  Post vital signs: Reviewed and stable  Last Vitals:  Vitals:   03/21/16 0625 03/21/16 1212  BP: 109/64   Pulse: 71   Resp: 18   Temp: 36.7 C (P) 36.4 C    Last Pain:  Vitals:   03/21/16 0722  TempSrc:   PainSc: 7       Patients Stated Pain Goal: 2 (03/21/16 16100722)  Complications: No apparent anesthesia complications

## 2016-03-22 MED ORDER — OXYCODONE-ACETAMINOPHEN 5-325 MG PO TABS: 1.0000 | ORAL_TABLET | ORAL | 0 refills | Status: DC | PRN

## 2016-03-22 MED ORDER — DIAZEPAM 5 MG PO TABS: 5.0000 mg | ORAL_TABLET | Freq: Four times a day (QID) | ORAL | 0 refills | Status: DC | PRN

## 2016-03-22 NOTE — Progress Notes (Signed)
Pt doing well. Pt and husband given D/C instructions with Rx's, verbal understanding was provided. Pt's incision is clean and dry with no sign of infection. Pt's IV was removed prior to D/C. Pt D/C'd home via wheelchair @ 1300 per MD order. Pt is stable @ D/C and has no other needs at this time. Sadie Pickar, RN  

## 2016-03-22 NOTE — Progress Notes (Signed)
Pt received 3-n-1 from [facility] per MD order. Helma Argyle, RN  

## 2016-03-22 NOTE — Discharge Instructions (Signed)

## 2016-03-22 NOTE — Progress Notes (Signed)
Physical Therapy Treatment Patient Details Name: Carol NimsJanet S Stclair MRN: [id number] DOB: [birth year] Today's Date: 03/22/2016    History of Present Illness Pt to surgery for Bilateral L2-3, L3-4, L4-5 decompressive laminotomies with bilateral L2, L3, L4, L5 decompressive foraminotomies, in excess of what would be required for simple interbody fusion alone (03/21/2016). PMH of HTN, HA, anxiety, R TKA, L Partial Knee replacement, Breast surgery, back surgery (previous cervical fusion).    PT Comments    Pt progressing towards physical therapy goals. Pt was educated on car transfer, walking program, precautions, and general safety with mobility. Pt anticipates d/c home today. Will continue to follow and progress as able per POC.   Follow Up Recommendations  Home health PT;Supervision - Intermittent     Equipment Recommendations  None recommended by PT    Recommendations for Other Services       Precautions / Restrictions Precautions Precautions: Back Precaution Booklet Issued: No (PT already provided) Precaution Comments: Pt able to recall 3/3 back precations. Required Braces or Orthoses: Spinal Brace Spinal Brace: Lumbar corset Restrictions Weight Bearing Restrictions: No    Mobility  Bed Mobility Overal bed mobility: Needs Assistance Bed Mobility: Rolling;Sidelying to Sit Rolling: Supervision Sidelying to sit: Supervision       General bed mobility comments: Pt OOB in chair upon arrival.  Transfers Overall transfer level: Needs assistance Equipment used: None Transfers: Sit to/from Stand Sit to Stand: Supervision         General transfer comment: Supervision for safety, no physical assist required. No unsteadiness or LOB.  Ambulation/Gait Ambulation/Gait assistance: Min guard;Supervision Ambulation Distance (Feet): 400 Feet Assistive device: None Gait Pattern/deviations: Step-through pattern;Decreased stride length Gait velocity: Decreased Gait velocity  interpretation: Below normal speed for age/gender General Gait Details: Min guard progressing to supervision for safety. No assist required.    Stairs Stairs: Yes   Stair Management: One rail Right;Step to pattern;Forwards Number of Stairs: 10 General stair comments: Pt was able to negotiate stairs fairly well but had 1 knee buckle upon initiation of descent. Pt required min assist to recover and was educated on proper sequencing.   Wheelchair Mobility    Modified Rankin (Stroke Patients Only)       Balance Overall balance assessment: No apparent balance deficits (not formally assessed) Sitting-balance support: Feet supported;No upper extremity supported Sitting balance-Leahy Scale: Fair     Standing balance support: Bilateral upper extremity supported;During functional activity Standing balance-Leahy Scale: Poor Standing balance comment: Pt reliant on RW for standing balance                    Cognition Arousal/Alertness: Awake/alert Behavior During Therapy: WFL for tasks assessed/performed Overall Cognitive Status: Within Functional Limits for tasks assessed                      Exercises      General Comments        Pertinent Vitals/Pain Pain Assessment: 0-10 Pain Score: 3  Pain Location: back Pain Descriptors / Indicators: Sore Pain Intervention(s): Monitored during session    Home Living Family/patient expects to be discharged to:: Private residence Living Arrangements: Spouse/significant other Available Help at Discharge: Family;Available 24 hours/day Type of Home: House Home Access: Stairs to enter Entrance Stairs-Rails: Left;Right Home Layout: One level Home Equipment: Environmental consultantWalker - 2 wheels;Shower seat - built in      Prior Function Level of Independence: Independent          PT Goals (current goals can  now be found in the care plan section) Acute Rehab PT Goals Patient Stated Goal: go home PT Goal Formulation: With patient Time  For Goal Achievement: 04/04/16 Potential to Achieve Goals: Good Progress towards PT goals: Progressing toward goals    Frequency    Min 5X/week      PT Plan Current plan remains appropriate    Co-evaluation             End of Session Equipment Utilized During Treatment: Gait belt;Back brace Activity Tolerance: Patient limited by fatigue;Patient limited by lethargy Patient left: in bed;with call bell/phone within reach;with family/visitor present     Time: 0800-0820 PT Time Calculation (min) (ACUTE ONLY): 20 min  Charges:  $Gait Training: 8-22 mins                    G Codes:      Marylynn Pearson 2016-04-09, 9:28 AM  Conni Slipper, PT, DPT Acute Rehabilitation Services Pager: [phone]

## 2016-03-22 NOTE — Discharge Summary (Signed)
Physician Discharge Summary  Patient ID: Carol [name] MRN: [id number] DOB/AGE: 1945-09-20 71 y.o.  Admit date: 03/21/2016 Discharge date: 03/22/2016  Admission Diagnoses:  Discharge Diagnoses:  Active Problems:   Degenerative spondylolisthesis   Discharged Condition: good  Hospital Course: Patient admitted to the hospital where she underwent an uncomplicated three-level lumbar decompression and fusion. Postoperatively she is doing quite well. Preoperative back and lower extremity pain and numbness much improved. She is standing straight or. She is walking better. Her pain is well-controlled and she is ready for discharge home.  Consults:   Significant Diagnostic Studies:   Treatments:   Discharge Exam: Blood pressure 109/78, pulse (!) 106, temperature 98.6 F (37 C), temperature source Oral, resp. rate 20, SpO2 95 %. Patient is awake and alert. She is oriented and appropriate. Her cranial nerve function is intact. Speech is fluent. Judgment and insight normal. Motor and sensory examination the extremities normal. Wound clean and dry. Chest and abdomen benign.  Disposition: 01-Home or Self Care   Allergies as of 03/22/2016      Reactions   Hydralazine Nausea And Vomiting   Ibandronic Acid Other (See Comments)   cracked teeth and joint pain   Penicillins Hives   Has patient had a PCN reaction causing immediate rash, facial/tongue/throat swelling, SOB or lightheadedness with hypotension: No Has patient had a PCN reaction causing severe rash involving mucus membranes or skin necrosis: Yes Has patient had a PCN reaction that required hospitalization No Has patient had a PCN reaction occurring within the last 10 years: No If all of the above answers are "NO", then may proceed with Cephalosporin use.   Gentamycin [gentamicin] Rash      Medication List    TAKE these medications   baclofen 10 MG tablet Commonly known as:  LIORESAL Take 10 mg by mouth 2 (two) times daily.    cetirizine 10 MG tablet Commonly known as:  ZYRTEC Take 10 mg by mouth daily.   chlorthalidone 25 MG tablet Commonly known as:  HYGROTON Take 25 mg by mouth daily before breakfast.   clindamycin 300 MG capsule Commonly known as:  CLEOCIN Take 300 mg by mouth daily as needed (dental visit).   clonazePAM 0.5 MG tablet Commonly known as:  KLONOPIN Take 0.5 mg by mouth daily as needed for anxiety.   diazepam 5 MG tablet Commonly known as:  VALIUM Take 1-2 tablets (5-10 mg total) by mouth every 6 (six) hours as needed for muscle spasms.   docusate sodium 100 MG capsule Commonly known as:  COLACE Take 200 mg by mouth at bedtime.   escitalopram 10 MG tablet Commonly known as:  LEXAPRO Take 10 mg by mouth daily after breakfast.   LUBRICATING EYE DROPS OP Apply 1 drop to eye daily as needed (dry).   meloxicam 15 MG tablet Commonly known as:  MOBIC Take 15 mg by mouth daily.   oxyCODONE-acetaminophen 5-325 MG tablet Commonly known as:  PERCOCET/ROXICET Take 1-2 tablets by mouth every 4 (four) hours as needed for moderate pain.   oxymetazoline 0.05 % nasal spray Commonly known as:  AFRIN Place 1 spray into both nostrils daily as needed for congestion.   rizatriptan 10 MG disintegrating tablet Commonly known as:  MAXALT-MLT Take 10 mg by mouth as needed for migraine. May repeat in 2 hours if needed   rosuvastatin 5 MG tablet Commonly known as:  CRESTOR Take 5 mg by mouth every other day.   SLEEP AID PO Take 1 tablet by mouth  at bedtime as needed (sleep).   SUMAtriptan 100 MG tablet Commonly known as:  IMITREX Take 100 mg by mouth every 2 (two) hours as needed for migraine. May repeat in 2 hours if headache persists or recurs.            Durable Medical Equipment        Start     Ordered   03/21/16 1428  DME Walker rolling  Once    Question:  Patient needs a walker to treat with the following condition  Answer:  Degenerative spondylolisthesis   03/21/16 1427    03/21/16 1428  DME 3 n 1  Once     03/21/16 1427     Follow-up Information    Quadir Muns A, MD Follow up.   Specialty:  Neurosurgery Contact information: [address] [place] [address] [phone]           Signed: Temple PaciniOOL,Toshia Larkin A 03/22/2016, 7:47 AM

## 2016-03-22 NOTE — Evaluation (Signed)
Occupational Therapy Evaluation Patient Details Name: Carol [name] MRN: [id number] DOB: 10-20-1945 Today's Date: 03/22/2016    History of Present Illness Pt to surgery for Bilateral L2-3, L3-4, L4-5 decompressive laminotomies with bilateral L2, L3, L4, L5 decompressive foraminotomies, in excess of what would be required for simple interbody fusion alone (03/21/2016). PMH of HTN, HA, anxiety, R TKA, L Partial Knee replacement, Breast surgery, back surgery (previous cervical fusion).   Clinical Impression   Pt reports she was independent with ADL PTA. Currently pt overall supervision for ADL and functional mobility. All back, safety, and ADL education completed with pt. Pt planning to d/c home with 24/7 supervision from family. No further acute OT needs identified; signing off at this time. Please re-consult if needs change. Thank you for this referral.     Follow Up Recommendations  No OT follow up;Supervision/Assistance - 24 hour (initially)    Equipment Recommendations  3 in 1 bedside commode    Recommendations for Other Services       Precautions / Restrictions Precautions Precautions: Back Precaution Booklet Issued: No (PT already provided) Precaution Comments: Pt able to recall 3/3 back precations. Required Braces or Orthoses: Spinal Brace Spinal Brace: Lumbar corset Restrictions Weight Bearing Restrictions: No      Mobility Bed Mobility               General bed mobility comments: Pt OOB in chair upon arrival.  Transfers Overall transfer level: Needs assistance Equipment used: None Transfers: Sit to/from Stand Sit to Stand: Supervision         General transfer comment: Supervision for safety, no physical assist required. No unsteadiness or LOB.    Balance Overall balance assessment: No apparent balance deficits (not formally assessed)                                          ADL Overall ADL's : Needs  assistance/impaired Eating/Feeding: Independent;Sitting   Grooming: Supervision/safety;Standing Grooming Details (indicate cue type and reason): Educated on use of 2 cups for oral care Upper Body Bathing: Set up;Sitting   Lower Body Bathing: Supervison/ safety;Sit to/from stand   Upper Body Dressing : Set up;Sitting Upper Body Dressing Details (indicate cue type and reason): Educated pt on donning/doffing back brace. Pt reports no difficulties donning brace this AM.  Lower Body Dressing: Supervision/safety;Sit to/from stand Lower Body Dressing Details (indicate cue type and reason): Pt able to cross foot over opposite knee. Educated pt on compensatory strategies for LB ADL. Toilet Transfer: Supervision/safety;Ambulation;BSC Toilet Transfer Details (indicate cue type and reason): Simulated by sit to stand from chair with functional mobility in room.   Toileting - Clothing Manipulation Details (indicate cue type and reason): Educated pt on peri care technique without twisting and use of wet wipes. Tub/ Shower Transfer: Walk-in shower;Supervision/safety;Ambulation;3 in 1 Tub/Shower Transfer Details (indicate cue type and reason): Educated on use of 3 in 1 in shower as a seat. Recommend supervision initially for shower transfers. Functional mobility during ADLs: Supervision/safety General ADL Comments: Educated pt on maintaining back precautions during functional activities, keeping frequenlty used items at counter top height, frequent mobility throughout the day upon return home.      Vision     Perception     Praxis      Pertinent Vitals/Pain Pain Assessment: 0-10 Pain Score: 3  Pain Location: back Pain Descriptors / Indicators: Sore Pain Intervention(s):  Monitored during session     Hand Dominance     Extremity/Trunk Assessment Upper Extremity Assessment Upper Extremity Assessment: Overall WFL for tasks assessed   Lower Extremity Assessment Lower Extremity Assessment:  Defer to PT evaluation   Cervical / Trunk Assessment Cervical / Trunk Assessment: Other exceptions Cervical / Trunk Exceptions: s/p spinal sx   Communication Communication Communication: No difficulties   Cognition Arousal/Alertness: Awake/alert Behavior During Therapy: WFL for tasks assessed/performed Overall Cognitive Status: Within Functional Limits for tasks assessed                     General Comments       Exercises       Shoulder Instructions      Home Living Family/patient expects to be discharged to:: Private residence Living Arrangements: Spouse/significant other Available Help at Discharge: Family;Available 24 hours/day Type of Home: House Home Access: Stairs to enter Entergy CorporationEntrance Stairs-Number of Steps: 6 Entrance Stairs-Rails: Left;Right Home Layout: One level     Bathroom Shower/Tub: Producer, television/film/videoWalk-in shower   Bathroom Toilet: Standard     Home Equipment: Environmental consultantWalker - 2 wheels;Shower seat - built in          Prior Functioning/Environment Level of Independence: Independent                 OT Problem List:     OT Treatment/Interventions:      OT Goals(Current goals can be found in the care plan section) Acute Rehab OT Goals Patient Stated Goal: go home OT Goal Formulation: All assessment and education complete, DC therapy  OT Frequency:     Barriers to D/C:            Co-evaluation              End of Session Equipment Utilized During Treatment: Back brace Nurse Communication: Mobility status;Other (comment) (needs 3 in 1 for home)  Activity Tolerance: Patient tolerated treatment well Patient left: in chair;with call bell/phone within reach   Time: 0827-0839 OT Time Calculation (min): 12 min Charges:  OT General Charges $OT Visit: 1 Procedure OT Evaluation $OT Eval Moderate Complexity: 1 Procedure G-Codes:     Gaye AlkenBailey A Jordana Dugue M.S., OTR/L Pager: [phone]  03/22/2016, 8:58 AM

## 2016-03-25 ENCOUNTER — Emergency Department (HOSPITAL_COMMUNITY): Payer: Medicare Other

## 2016-03-25 ENCOUNTER — Emergency Department (HOSPITAL_COMMUNITY)
Admission: EM | Admit: 2016-03-25 | Discharge: 2016-03-25 | Disposition: A | Discharge status: Home / Self Care | Payer: Medicare Other | Attending: Emergency Medicine | Admitting: Emergency Medicine

## 2016-03-25 ENCOUNTER — Encounter (HOSPITAL_COMMUNITY): Payer: Self-pay | Admitting: *Deleted

## 2016-03-25 DIAGNOSIS — Z79899 Other long term (current) drug therapy: Secondary | ICD-10-CM | POA: Insufficient documentation

## 2016-03-25 DIAGNOSIS — Z969 Presence of functional implant, unspecified: Secondary | ICD-10-CM | POA: Insufficient documentation

## 2016-03-25 DIAGNOSIS — I10 Essential (primary) hypertension: Secondary | ICD-10-CM | POA: Insufficient documentation

## 2016-03-25 DIAGNOSIS — Z87891 Personal history of nicotine dependence: Secondary | ICD-10-CM | POA: Insufficient documentation

## 2016-03-25 DIAGNOSIS — R5082 Postprocedural fever: Secondary | ICD-10-CM | POA: Diagnosis not present

## 2016-03-25 DIAGNOSIS — G8918 Other acute postprocedural pain: Secondary | ICD-10-CM

## 2016-03-25 LAB — URINALYSIS, ROUTINE W REFLEX MICROSCOPIC
Bilirubin Urine: NEGATIVE
GLUCOSE, UA: NEGATIVE mg/dL
HGB URINE DIPSTICK: NEGATIVE
Ketones, ur: NEGATIVE mg/dL
NITRITE: NEGATIVE
Protein, ur: NEGATIVE mg/dL
SPECIFIC GRAVITY, URINE: 1.014 (ref 1.005–1.030)
pH: 7 (ref 5.0–8.0)

## 2016-03-25 LAB — CBC WITH DIFFERENTIAL/PLATELET
BASOS ABS: 0 10*3/uL (ref 0.0–0.1)
BASOS PCT: 0 %
EOS ABS: 0.3 10*3/uL (ref 0.0–0.7)
Eosinophils Relative: 3 %
HCT: 30.7 % — ABNORMAL LOW (ref 36.0–46.0)
HEMOGLOBIN: 10.5 g/dL — AB (ref 12.0–15.0)
Lymphocytes Relative: 20 %
Lymphs Abs: 1.7 10*3/uL (ref 0.7–4.0)
MCH: 30.2 pg (ref 26.0–34.0)
MCHC: 34.2 g/dL (ref 30.0–36.0)
MCV: 88.2 fL (ref 78.0–100.0)
MONOS PCT: 8 %
Monocytes Absolute: 0.7 10*3/uL (ref 0.1–1.0)
NEUTROS PCT: 69 %
Neutro Abs: 6 10*3/uL (ref 1.7–7.7)
Platelets: 240 10*3/uL (ref 150–400)
RBC: 3.48 MIL/uL — ABNORMAL LOW (ref 3.87–5.11)
RDW: 13.3 % (ref 11.5–15.5)
WBC: 8.7 10*3/uL (ref 4.0–10.5)

## 2016-03-25 LAB — COMPREHENSIVE METABOLIC PANEL
ALK PHOS: 86 U/L (ref 38–126)
ALT: 109 U/L — AB (ref 14–54)
ANION GAP: 12 (ref 5–15)
AST: 99 U/L — ABNORMAL HIGH (ref 15–41)
Albumin: 3.3 g/dL — ABNORMAL LOW (ref 3.5–5.0)
BILIRUBIN TOTAL: 0.6 mg/dL (ref 0.3–1.2)
BUN: 9 mg/dL (ref 6–20)
CALCIUM: 8.9 mg/dL (ref 8.9–10.3)
CO2: 25 mmol/L (ref 22–32)
CREATININE: 0.81 mg/dL (ref 0.44–1.00)
Chloride: 95 mmol/L — ABNORMAL LOW (ref 101–111)
Glucose, Bld: 102 mg/dL — ABNORMAL HIGH (ref 65–99)
Potassium: 3.1 mmol/L — ABNORMAL LOW (ref 3.5–5.1)
Sodium: 132 mmol/L — ABNORMAL LOW (ref 135–145)
TOTAL PROTEIN: 6.3 g/dL — AB (ref 6.5–8.1)

## 2016-03-25 NOTE — ED Notes (Signed)
Patient transported to X-ray 

## 2016-03-25 NOTE — ED Provider Notes (Signed)
[facility]-EMERGENCY DEPT Provider Note   CSN: [id number] Arrival date & time: 03/25/16  1100   History   Chief Complaint Chief Complaint  Patient presents with  . Post-op Problem    HPI Carol [name] is a 71 y.o. female.  HPI  Patients PMH positive for anxiety, arthritis, depression, headache, hypertension, and positive TB test. Patient to the ER for evaluation of hip pain and low grade fever. She had a lumbar fusion done on 03/22/15 by Dr. Dutch Quint. She reports doing well until yesterday when she had a low grade fever and the pain started. She does not have a fever in triage and has been taking Ibuprofen, last dose at 9:30 am today. She called [facility] and they recommended she come in to the ER   Past Medical History:  Diagnosis Date  . Anxiety   . Arthritis   . Depression   . Headache    migraine - last one 2016  . Hypertension    reason that she was given Hygroton & she remarks that her BP has been better she has been taking it.   Marland Kitchen Positive skin test for tuberculosis    told that she is a carrier,     Patient Active Problem List   Diagnosis Date Noted  . Degenerative spondylolisthesis 03/21/2016    Past Surgical History:  Procedure Laterality Date  . ABDOMINAL HYSTERECTOMY    . BACK SURGERY     x3 previous cervical fusion & then removed hardware   . BREAST SURGERY Right    benign-   . HAND TENDON SURGERY Right 2016  . JOINT REPLACEMENT Right    also had a partial on the L   . LUMBAR FUSION    . MYRINGOTOMY WITH TUBE PLACEMENT Right   . TONSILLECTOMY    . TUBAL LIGATION     had a 2nd. surgery for adhesions    OB History    No data available       Home Medications    Prior to Admission medications   Medication Sig Start Date End Date Taking? Authorizing Provider  baclofen (LIORESAL) 10 MG tablet Take 10 mg by mouth 2 (two) times daily.    Historical Provider, MD  Carboxymethylcellul-Glycerin (LUBRICATING EYE DROPS OP) Apply 1 drop to eye daily  as needed (dry).    Historical Provider, MD  cetirizine (ZYRTEC) 10 MG tablet Take 10 mg by mouth daily.    Historical Provider, MD  chlorthalidone (HYGROTON) 25 MG tablet Take 25 mg by mouth daily before breakfast.  02/16/16   Historical Provider, MD  clindamycin (CLEOCIN) 300 MG capsule Take 300 mg by mouth daily as needed (dental visit).    Historical Provider, MD  clonazePAM (KLONOPIN) 0.5 MG tablet Take 0.5 mg by mouth daily as needed for anxiety.    Historical Provider, MD  diazepam (VALIUM) 5 MG tablet Take 1-2 tablets (5-10 mg total) by mouth every 6 (six) hours as needed for muscle spasms. 03/22/16   Julio Sicks, MD  docusate sodium (COLACE) 100 MG capsule Take 200 mg by mouth at bedtime.    Historical Provider, MD  Doxylamine Succinate, Sleep, (SLEEP AID PO) Take 1 tablet by mouth at bedtime as needed (sleep).    Historical Provider, MD  escitalopram (LEXAPRO) 10 MG tablet Take 10 mg by mouth daily after breakfast.     Historical Provider, MD  meloxicam (MOBIC) 15 MG tablet Take 15 mg by mouth daily.    Historical Provider, MD  oxyCODONE-acetaminophen (PERCOCET/ROXICET) 5-325 MG tablet Take 1-2 tablets by mouth every 4 (four) hours as needed for moderate pain. 03/22/16   Julio SicksHenry Pool, MD  oxymetazoline (AFRIN) 0.05 % nasal spray Place 1 spray into both nostrils daily as needed for congestion.    Historical Provider, MD  rizatriptan (MAXALT-MLT) 10 MG disintegrating tablet Take 10 mg by mouth as needed for migraine. May repeat in 2 hours if needed    Historical Provider, MD  rosuvastatin (CRESTOR) 5 MG tablet Take 5 mg by mouth every other day. 02/12/16   Historical Provider, MD  SUMAtriptan (IMITREX) 100 MG tablet Take 100 mg by mouth every 2 (two) hours as needed for migraine. May repeat in 2 hours if headache persists or recurs.    Historical Provider, MD    Family History No family history on file.  Social History Social History  Substance Use Topics  . Smoking status: Former Smoker     Quit date: 03/15/1976  . Smokeless tobacco: Never Used  . Alcohol use No     Allergies   Hydralazine; Ibandronic acid; Penicillins; and Gentamycin [gentamicin]   Review of Systems Review of Systems Review of Systems All other systems negative except as documented in the HPI. All pertinent positives and negatives as reviewed in the HPI.   Physical Exam Updated Vital Signs BP 105/76   Pulse 94   Temp 98.9 F (37.2 C) (Oral)   Resp 18   Ht 5\' 3"  (1.6 m)   Wt 82.6 kg   SpO2 100%   BMI 32.24 kg/m   Physical Exam  Constitutional: She appears well-developed and well-nourished. No distress.  HENT:  Head: Normocephalic and atraumatic.  Eyes: Pupils are equal, round, and reactive to light.  Neck: Normal range of motion. Neck supple.  Cardiovascular: Normal rate and regular rhythm.   Pulmonary/Chest: Effort normal and breath sounds normal. No respiratory distress. She has no decreased breath sounds. She has no wheezes.  Abdominal: Soft. There is no tenderness. There is no rigidity, no rebound, no guarding and no CVA tenderness.  Musculoskeletal:  Lumbar surgical incision is well appearing. Mild tenderness. NO weakness or numbness to lower extremities.   Pt able to sit up and stand up without assistance. No signs of pain or weakness.  Neurological: She is alert.  Skin: Skin is warm and dry.  Nursing note and vitals reviewed.   ED Treatments / Results  Labs (all labs ordered are listed, but only abnormal results are displayed) Labs Reviewed  COMPREHENSIVE METABOLIC PANEL - Abnormal; Notable for the following:       Result Value   Sodium 132 (*)    Potassium 3.1 (*)    Chloride 95 (*)    Glucose, Bld 102 (*)    Total Protein 6.3 (*)    Albumin 3.3 (*)    AST 99 (*)    ALT 109 (*)    All other components within normal limits  CBC WITH DIFFERENTIAL/PLATELET - Abnormal; Notable for the following:    RBC 3.48 (*)    Hemoglobin 10.5 (*)    HCT 30.7 (*)    All other  components within normal limits  URINALYSIS, ROUTINE W REFLEX MICROSCOPIC - Abnormal; Notable for the following:    APPearance HAZY (*)    Leukocytes, UA TRACE (*)    Bacteria, UA RARE (*)    Squamous Epithelial / LPF 6-30 (*)    Non Squamous Epithelial 0-5 (*)    All other components within normal limits  EKG  EKG Interpretation None       Radiology Dg Chest 2 View  Result Date: 03/25/2016 CLINICAL DATA:  Postop fever. EXAM: CHEST  2 VIEW COMPARISON:  None. FINDINGS: The heart size and mediastinal contours are within normal limits. Both lungs are clear. The visualized skeletal structures are unremarkable.   IMPRESSION: No active cardiopulmonary disease. Electronically Signed   By: Gerome Sam III M.D   On: 03/25/2016 12:50     Procedures Procedures (including critical care time)  Medications Ordered in ED Medications - No data to display   Initial Impression / Assessment and Plan / ED Course  I have reviewed the triage vital signs and the nursing notes.  Pertinent labs & imaging results that were available during my care of the patient were reviewed by me and considered in my medical decision making (see chart for details).  Clinical Course    12: 45 pm -- I spoke with on-call for Neurosurgery, he said he had spoke with the family before sending them to the ER. Is not concerned about surgical complication but wanted to have a chest xray and urinalysis done to r/o infection. He thought her 101.6 temp was today, but the patient had a 100.9 temp today and a 101.6 temp on Wednesday. Pt is well appearing.  2:55 pm - chest xray and urinalysis unremarkable. Blood work is at patients baseline. Will recommend that she follow-up with DR. Dutch Quint, Dr. Eudelia Bunch has seen patient well.  Final Clinical Impressions(s) / ED Diagnoses   Final diagnoses:  Post-op pain    New Prescriptions New Prescriptions   No medications on file     Marlon Pel, Cordelia Poche 03/25/16 1455      Nira Conn, MD 03/25/16 [phone]

## 2016-03-25 NOTE — ED Triage Notes (Signed)
Pt states lumbar fusion surgery on 1/2 by Dr Dutch QuintPoole.  States was doing well, but yesterday spiked a fever of 100.9 and started experiencing hip pain.  Temp of 98.9 in triage - pt took 600 mg ibuprofen at 0930.

## 2016-05-15 ENCOUNTER — Ambulatory Visit (INDEPENDENT_AMBULATORY_CARE_PROVIDER_SITE_OTHER): Payer: Medicare Other | Admitting: Vascular Surgery

## 2016-05-15 ENCOUNTER — Encounter (INDEPENDENT_AMBULATORY_CARE_PROVIDER_SITE_OTHER): Payer: Self-pay | Admitting: Vascular Surgery

## 2016-05-15 VITALS — BP 131/80 | HR 80 | Resp 16 | Ht 63.0 in | Wt 177.0 lb

## 2016-05-15 DIAGNOSIS — I8312 Varicose veins of left lower extremity with inflammation: Secondary | ICD-10-CM | POA: Diagnosis not present

## 2016-05-15 DIAGNOSIS — M79605 Pain in left leg: Secondary | ICD-10-CM

## 2016-05-15 DIAGNOSIS — I872 Venous insufficiency (chronic) (peripheral): Secondary | ICD-10-CM | POA: Diagnosis not present

## 2016-05-15 DIAGNOSIS — M4310 Spondylolisthesis, site unspecified: Secondary | ICD-10-CM

## 2016-05-17 DIAGNOSIS — I872 Venous insufficiency (chronic) (peripheral): Secondary | ICD-10-CM | POA: Insufficient documentation

## 2016-05-17 DIAGNOSIS — I83813 Varicose veins of bilateral lower extremities with pain: Secondary | ICD-10-CM | POA: Insufficient documentation

## 2016-05-17 DIAGNOSIS — M79609 Pain in unspecified limb: Secondary | ICD-10-CM | POA: Insufficient documentation

## 2016-05-17 NOTE — Progress Notes (Signed)
MRN : [id number]  Carol [name] is a 71 y.o. ([birth year]) female who presents with chief complaint of  Chief Complaint  Patient presents with  . Follow-up  .  History of Present Illness:The patient is seen for evaluation of painful left lower extremity. Patient notes the pain is variable and not always associated with activity.  The pain is located in the medial calf area and per the patient seems to be associated with some varicose veins.  The pain is somewhat consistent day to day occurring on most days. The patient notes the pain also occurs with standing and routinely seems worse as the day wears on. The pain has been progressive over the past several years. The patient states these symptoms are causing  a profound negative impact on quality of life and daily activities.  The patient denies rest pain or dangling of an extremity off the side of the bed during the night for relief. No open wounds or sores at this time. No history of DVT or phlebitis. No prior interventions or surgeries.  There is a  history of back problems and DJD of the lumbar and sacral spine.    Current Meds  Medication Sig  . baclofen (LIORESAL) 10 MG tablet Take 10 mg by mouth 2 (two) times daily.  . Carboxymethylcellul-Glycerin (LUBRICATING EYE DROPS OP) Apply 1 drop to eye daily as needed (dry).  . cetirizine (ZYRTEC) 10 MG tablet Take 10 mg by mouth daily.  . chlorthalidone (HYGROTON) 25 MG tablet Take 25 mg by mouth daily before breakfast.   . clindamycin (CLEOCIN) 300 MG capsule Take 300 mg by mouth daily as needed (dental visit).  . clonazePAM (KLONOPIN) 0.5 MG tablet Take 0.5 mg by mouth daily as needed for anxiety.  . cyclobenzaprine (FLEXERIL) 10 MG tablet   . diazepam (VALIUM) 5 MG tablet Take 1-2 tablets (5-10 mg total) by mouth every 6 (six) hours as needed for muscle spasms.  Marland Kitchen docusate sodium (COLACE) 100 MG capsule Take 200 mg by mouth at bedtime.  Marland Kitchen escitalopram (LEXAPRO) 10 MG tablet Take 10  mg by mouth daily after breakfast.   . HYDROcodone-acetaminophen (NORCO) 10-325 MG tablet   . meloxicam (MOBIC) 15 MG tablet Take 15 mg by mouth daily.  Marland Kitchen oxymetazoline (AFRIN) 0.05 % nasal spray Place 1 spray into both nostrils daily as needed for congestion.  . rizatriptan (MAXALT-MLT) 10 MG disintegrating tablet Take 10 mg by mouth as needed for migraine. May repeat in 2 hours if needed  . rosuvastatin (CRESTOR) 5 MG tablet Take 5 mg by mouth every other day.  . SUMAtriptan (IMITREX) 100 MG tablet Take 100 mg by mouth every 2 (two) hours as needed for migraine. May repeat in 2 hours if headache persists or recurs.    Past Medical History:  Diagnosis Date  . Anxiety   . Arthritis   . Depression   . Headache    migraine - last one 2016  . Hypertension    reason that she was given Hygroton & she remarks that her BP has been better she has been taking it.   Marland Kitchen Positive skin test for tuberculosis    told that she is a carrier,     Past Surgical History:  Procedure Laterality Date  . ABDOMINAL HYSTERECTOMY    . BACK SURGERY     x3 previous cervical fusion & then removed hardware   . BREAST SURGERY Right    benign-   . HAND TENDON SURGERY  Right 2016  . JOINT REPLACEMENT Right    also had a partial on the L   . LUMBAR FUSION    . MYRINGOTOMY WITH TUBE PLACEMENT Right   . TONSILLECTOMY    . TUBAL LIGATION     had a 2nd. surgery for adhesions    Social History Social History  Substance Use Topics  . Smoking status: Former Smoker    Quit date: 03/15/1976  . Smokeless tobacco: Never Used  . Alcohol use No    Family History No family history on file. No family history of bleeding/clotting disorders, porphyria or autoimmune disease   Allergies  Allergen Reactions  . Hydralazine Nausea And Vomiting  . Ibandronic Acid Other (See Comments)    cracked teeth and joint pain  . Penicillins Hives    Has patient had a PCN reaction causing immediate rash, facial/tongue/throat  swelling, SOB or lightheadedness with hypotension: No Has patient had a PCN reaction causing severe rash involving mucus membranes or skin necrosis: Yes Has patient had a PCN reaction that required hospitalization No Has patient had a PCN reaction occurring within the last 10 years: No If all of the above answers are "NO", then may proceed with Cephalosporin use.   . Gentamycin [Gentamicin] Rash     REVIEW OF SYSTEMS (Negative unless checked)  Constitutional: [] Weight loss  [] Fever  [] Chills Cardiac: [] Chest pain   [] Chest pressure   [] Palpitations   [] Shortness of breath when laying flat   [] Shortness of breath with exertion. Vascular:  [] Pain in legs with walking   [x] Pain in legs at rest  [] History of DVT   [] Phlebitis   [] Swelling in legs   [x] Varicose veins   [] Non-healing ulcers Pulmonary:   [] Uses home oxygen   [] Productive cough   [] Hemoptysis   [] Wheeze  [] COPD   [] Asthma Neurologic:  [] Dizziness   [] Seizures   [] History of stroke   [] History of TIA  [] Aphasia   [] Vissual changes   [] Weakness or numbness in arm   [] Weakness or numbness in leg Musculoskeletal:   [] Joint swelling   [] Joint pain   [] Low back pain Hematologic:  [] Easy bruising  [] Easy bleeding   [] Hypercoagulable state   [] Anemic Gastrointestinal:  [] Diarrhea   [] Vomiting  [] Gastroesophageal reflux/heartburn   [] Difficulty swallowing. Genitourinary:  [] Chronic kidney disease   [] Difficult urination  [] Frequent urination   [] Blood in urine Skin:  [] Rashes   [] Ulcers  Psychological:  [] History of anxiety   []  History of major depression.  Physical Examination  Vitals:   05/15/16 1600  BP: 131/80  Pulse: 80  Resp: 16  Weight: 177 lb (80.3 kg)  Height: 5\' 3"  (1.6 m)   Body mass index is 31.35 kg/m. Gen: WD/WN, NAD Head: [place]/AT, No temporalis wasting.  Ear/Nose/Throat: Hearing grossly intact, nares w/o erythema or drainage, poor dentition Eyes: PER, EOMI, sclera nonicteric.  Neck: Supple, no masses.  No bruit  or JVD.  Pulmonary:  Good air movement, clear to auscultation bilaterally, no use of accessory muscles.  Cardiac: RRR, normal S1, S2, no Murmurs. Vascular: scattered varicose veins left leg with mild venous stasis changes Vessel Right Left  Radial Palpable Palpable  Ulnar Palpable Palpable  Brachial Palpable Palpable  Carotid Palpable Palpable  Femoral Palpable Palpable  Popliteal Palpable Palpable  PT Palpable Palpable  DP Palpable Palpable  Gastrointestinal: soft, non-distended. No guarding/no peritoneal signs.  Musculoskeletal: M/S 5/5 throughout.  No deformity or atrophy.  Neurologic: CN 2-12 intact. Pain and light touch intact in extremities.  Symmetrical.  Speech is fluent. Motor exam as listed above. Psychiatric: Judgment intact, Mood & affect appropriate for pt's clinical situation. Dermatologic: No rashes or ulcers noted.  No changes consistent with cellulitis. Lymph : No Cervical lymphadenopathy, no lichenification or skin changes of chronic lymphedema.  CBC Lab Results  Component Value Date   WBC 8.7 03/25/2016   HGB 10.5 (L) 03/25/2016   HCT 30.7 (L) 03/25/2016   MCV 88.2 03/25/2016   PLT 240 03/25/2016    BMET    Component Value Date/Time   NA 132 (L) 03/25/2016 1129   NA 136 09/06/2012 0718   K 3.1 (L) 03/25/2016 1129   K 3.4 (L) 09/06/2012 0718   CL 95 (L) 03/25/2016 1129   CL 104 09/06/2012 0718   CO2 25 03/25/2016 1129   CO2 27 09/06/2012 0718   GLUCOSE 102 (H) 03/25/2016 1129   GLUCOSE 102 (H) 09/06/2012 0718   BUN 9 03/25/2016 1129   BUN 9 09/06/2012 0718   CREATININE 0.81 03/25/2016 1129   CREATININE 1.01 09/06/2012 0718   CALCIUM 8.9 03/25/2016 1129   CALCIUM 8.9 09/06/2012 0718   GFRNONAA >60 03/25/2016 1129   GFRNONAA 58 (L) 09/06/2012 0718   GFRAA >60 03/25/2016 1129   GFRAA >60 09/06/2012 0718   CrCl cannot be calculated (Patient's most recent lab result is older than the maximum 21 days allowed.).  COAG No results found for: INR,  PROTIME  Radiology No results found.  Assessment/Plan 1. Varicose veins of left lower extremity with inflammation  Recommend:  The patient has atypical pain symptoms for pure atherosclerotic disease. However, on physical exam there is evidence of mixed venous and arterial disease, given the diminished pulses and the edema associated with venous changes of the legs.  Noninvasive study including left leg venous ultrasound of the left leg will be obtained and the patient will follow up with me to review these studies.  The patient should continue walking and begin a more formal exercise program. The patient should continue his antiplatelet therapy and aggressive treatment of the lipid abnormalities.  The patient should begin wearing graduated compression socks 15-20 mmHg strength to control edema.   2. Venous insufficiency See #1  3. Pain of left lower extremity See #1  4. Degenerative spondylolisthesis This too could be the cause of her pain    Levora Dredge, MD  05/17/2016 8:25 PM

## 2016-06-28 ENCOUNTER — Encounter (INDEPENDENT_AMBULATORY_CARE_PROVIDER_SITE_OTHER): Payer: Self-pay | Admitting: Vascular Surgery

## 2016-06-28 ENCOUNTER — Ambulatory Visit (INDEPENDENT_AMBULATORY_CARE_PROVIDER_SITE_OTHER): Payer: Medicare Other

## 2016-06-28 ENCOUNTER — Encounter (INDEPENDENT_AMBULATORY_CARE_PROVIDER_SITE_OTHER): Payer: Self-pay

## 2016-06-28 ENCOUNTER — Ambulatory Visit (INDEPENDENT_AMBULATORY_CARE_PROVIDER_SITE_OTHER): Payer: Medicare Other | Admitting: Vascular Surgery

## 2016-06-28 VITALS — BP 122/73 | HR 75 | Resp 16 | Ht 63.0 in | Wt 173.0 lb

## 2016-06-28 DIAGNOSIS — I872 Venous insufficiency (chronic) (peripheral): Secondary | ICD-10-CM

## 2016-06-28 DIAGNOSIS — I8312 Varicose veins of left lower extremity with inflammation: Secondary | ICD-10-CM

## 2016-06-28 NOTE — Progress Notes (Signed)
Subjective:    Patient ID: Carol [name], female    DOB: 08/16/1945, 71 y.o.   MRN: [id number] Chief Complaint  Patient presents with  . Re-evaluation    Ultrasound follow up   Patient last seen on 05/15/16 for evaluation of a "painful left lower extremity". She presents to review vascular studies. Her left lower extremity discomfort is still present. Since her last visit, the patient has been wearing medical grade one compression, elevating her legs and remaining active with minimal relief requiring the use of OTC anti-inflammatories. Patient notes the pain is variable and not always associated with activity.  The pain is located in the medial calf area and per the patient seems to be associated with some varicose veins. The pain is somewhat consistent day to day occurring on most days. The patient notes the pain also occurs with standing and routinely seems worse as the day wears on. The pain has been progressive over the past several years. The patient states these symptoms are causing  a profound negative impact on quality of life and daily activities. A left lower extremity venous duplex was notable for reflux in the proximal GSV.    Review of Systems  Constitutional: Negative.   HENT: Negative.   Eyes: Negative.   Respiratory: Negative.   Cardiovascular: Positive for leg swelling.       Left lower extremity pain  Gastrointestinal: Negative.   Endocrine: Negative.   Genitourinary: Negative.   Musculoskeletal: Negative.   Skin: Negative.   Allergic/Immunologic: Negative.   Neurological: Negative.   Hematological: Negative.   Psychiatric/Behavioral: Negative.       Objective:   Physical Exam  Constitutional: She is oriented to person, place, and time. She appears well-developed and well-nourished. No distress.  HENT:  Head: Normocephalic and atraumatic.  Eyes: Conjunctivae are normal. Pupils are equal, round, and reactive to light.  Neck: Normal range of motion.    Cardiovascular: Normal rate, regular rhythm, normal heart sounds and intact distal pulses.   Pulses:      Radial pulses are 2+ on the right side, and 2+ on the left side.       Dorsalis pedis pulses are 2+ on the right side, and 2+ on the left side.       Posterior tibial pulses are 2+ on the right side, and 2+ on the left side.  Pulmonary/Chest: Effort normal.  Musculoskeletal: Normal range of motion. She exhibits edema (Moderate left lower extremity edema. ).  Neurological: She is alert and oriented to person, place, and time.  Skin: Skin is warm and dry. She is not diaphoretic.  Psychiatric: She has a normal mood and affect. Her behavior is normal. Judgment and thought content normal.  Vitals reviewed.  BP 122/73 (BP Location: Right Arm)   Pulse 75   Resp 16   Ht  (1.6 m)   Wt 173 lb (78.5 kg)   BMI 30.65 kg/m   Past Medical History:  Diagnosis Date  . Anxiety   . Arthritis   . Depression   . Headache    migraine - last one 2016  . Hypertension    reason that she was given [place] & she remarks that her BP has been better she has been taking it.   Marland Kitchen Positive skin test for tuberculosis    told that she is a carrier,    Social History   Social History  . Marital status: Married    Spouse name: N/A  .  Number of children: N/A  . Years of education: N/A   Occupational History  . Not on file.   Social History Main Topics  . Smoking status: Former Smoker    Quit date: 03/15/1976  . Smokeless tobacco: Never Used  . Alcohol use No  . Drug use: Yes    Frequency: 1.0 time per week    Types: Marijuana  . Sexual activity: Not on file   Other Topics Concern  . Not on file   Social History Narrative  . No narrative on file   Past Surgical History:  Procedure Laterality Date  . ABDOMINAL HYSTERECTOMY    . BACK SURGERY     x3 previous cervical fusion & then removed hardware   . BREAST SURGERY Right    benign-   . HAND TENDON SURGERY Right 2016  . JOINT  REPLACEMENT Right    also had a partial on the L   . LUMBAR FUSION    . MYRINGOTOMY WITH TUBE PLACEMENT Right   . TONSILLECTOMY    . TUBAL LIGATION     had a 2nd. surgery for adhesions   No family history on file.  Allergies  Allergen Reactions  . Hydralazine Nausea And Vomiting  . Ibandronic Acid Other (See Comments)    cracked teeth and joint pain  . Penicillins Hives    Has patient had a PCN reaction causing immediate rash, facial/tongue/throat swelling, SOB or lightheadedness with hypotension: No Has patient had a PCN reaction causing severe rash involving mucus membranes or skin necrosis: Yes Has patient had a PCN reaction that required hospitalization No Has patient had a PCN reaction occurring within the last 10 years: No If all of the above answers are "NO", then may proceed with Cephalosporin use.   . Gentamycin [Gentamicin] Rash      Assessment & Plan:  Patient last seen on 05/15/16 for evaluation of a "painful left lower extremity". She presents to review vascular studies. Her left lower extremity discomfort is still present. Since her last visit, the patient has been wearing medical grade one compression, elevating her legs and remaining active with minimal relief requiring the use of OTC anti-inflammatories. Patient notes the pain is variable and not always associated with activity.  The pain is located in the medial calf area and per the patient seems to be associated with some varicose veins. The pain is somewhat consistent day to day occurring on most days. The patient notes the pain also occurs with standing and routinely seems worse as the day wears on. The pain has been progressive over the past several years. The patient states these symptoms are causing  a profound negative impact on quality of life and daily activities. A left lower extremity venous duplex was notable for reflux in the proximal GSV.   1. Varicose veins of left lower extremity with inflammation -  Stable Will apply for left lower extremity GSV laser ablation followed by sclerotherapy into the painful varicose veins. Continue compression and elevation for now.   2. Chronic venous insufficiency - New Patient has failed conservative therapy. The patient is likely to benefit from endovenous laser ablation. I have discussed the risks and benefits of the procedure. The risks primarily include DVT, recanalization, bleeding, infection, and inability to gain access. Continue compression and elevation for now.   Current Outpatient Prescriptions on File Prior to Visit  Medication Sig Dispense Refill  . baclofen (LIORESAL) 10 MG tablet Take 10 mg by mouth 2 (two) times daily.    Marland Kitchen  Carboxymethylcellul-Glycerin (LUBRICATING EYE DROPS OP) Apply 1 drop to eye daily as needed (dry).    . cetirizine (ZYRTEC) 10 MG tablet Take 10 mg by mouth daily.    . chlorthalidone (HYGROTON) 25 MG tablet Take 25 mg by mouth daily before breakfast.     . clindamycin (CLEOCIN) 300 MG capsule Take 300 mg by mouth daily as needed (dental visit).    . clonazePAM (KLONOPIN) 0.5 MG tablet Take 0.5 mg by mouth daily as needed for anxiety.    . cyclobenzaprine (FLEXERIL) 10 MG tablet     . diazepam (VALIUM) 5 MG tablet Take 1-2 tablets (5-10 mg total) by mouth every 6 (six) hours as needed for muscle spasms. 60 tablet 0  . docusate sodium (COLACE) 100 MG capsule Take 200 mg by mouth at bedtime.    . Doxylamine Succinate, Sleep, (SLEEP AID PO) Take 1 tablet by mouth at bedtime as needed (sleep).    Marland Kitchen escitalopram (LEXAPRO) 10 MG tablet Take 10 mg by mouth daily after breakfast.     . HYDROcodone-acetaminophen (NORCO) 10-325 MG tablet     . meloxicam (MOBIC) 15 MG tablet Take 15 mg by mouth daily.    Marland Kitchen oxyCODONE-acetaminophen (PERCOCET/ROXICET) 5-325 MG tablet Take 1-2 tablets by mouth every 4 (four) hours as needed for moderate pain. (Patient not taking: Reported on 05/15/2016) 60 tablet 0  . oxymetazoline (AFRIN) 0.05 % nasal  spray Place 1 spray into both nostrils daily as needed for congestion.    . rizatriptan (MAXALT-MLT) 10 MG disintegrating tablet Take 10 mg by mouth as needed for migraine. May repeat in 2 hours if needed    . rosuvastatin (CRESTOR) 5 MG tablet Take 5 mg by mouth every other day.    . SUMAtriptan (IMITREX) 100 MG tablet Take 100 mg by mouth every 2 (two) hours as needed for migraine. May repeat in 2 hours if headache persists or recurs.     No current facility-administered medications on file prior to visit.     There are no Patient Instructions on file for this visit. No Follow-up on file.   Chong Wojdyla A Fayrene Towner, PA-C

## 2016-06-29 ENCOUNTER — Ambulatory Visit (INDEPENDENT_AMBULATORY_CARE_PROVIDER_SITE_OTHER): Payer: Medicare Other | Admitting: Vascular Surgery

## 2016-06-29 ENCOUNTER — Encounter (INDEPENDENT_AMBULATORY_CARE_PROVIDER_SITE_OTHER): Payer: Medicare Other

## 2016-07-19 ENCOUNTER — Other Ambulatory Visit: Payer: Self-pay

## 2016-07-19 ENCOUNTER — Encounter: Payer: Self-pay | Admitting: Family Medicine

## 2016-07-19 ENCOUNTER — Ambulatory Visit (INDEPENDENT_AMBULATORY_CARE_PROVIDER_SITE_OTHER): Payer: Medicare Other | Admitting: Family Medicine

## 2016-07-19 VITALS — BP 138/92 | HR 103 | Resp 16 | Ht 63.0 in | Wt 168.0 lb

## 2016-07-19 DIAGNOSIS — Z9622 Myringotomy tube(s) status: Secondary | ICD-10-CM | POA: Insufficient documentation

## 2016-07-19 DIAGNOSIS — R2681 Unsteadiness on feet: Secondary | ICD-10-CM | POA: Diagnosis not present

## 2016-07-19 DIAGNOSIS — G43909 Migraine, unspecified, not intractable, without status migrainosus: Secondary | ICD-10-CM | POA: Insufficient documentation

## 2016-07-19 DIAGNOSIS — G43109 Migraine with aura, not intractable, without status migrainosus: Secondary | ICD-10-CM

## 2016-07-19 DIAGNOSIS — I872 Venous insufficiency (chronic) (peripheral): Secondary | ICD-10-CM

## 2016-07-19 DIAGNOSIS — M797 Fibromyalgia: Secondary | ICD-10-CM | POA: Diagnosis not present

## 2016-07-19 DIAGNOSIS — I10 Essential (primary) hypertension: Secondary | ICD-10-CM | POA: Diagnosis not present

## 2016-07-19 DIAGNOSIS — M8588 Other specified disorders of bone density and structure, other site: Secondary | ICD-10-CM | POA: Diagnosis not present

## 2016-07-19 DIAGNOSIS — E785 Hyperlipidemia, unspecified: Secondary | ICD-10-CM | POA: Diagnosis not present

## 2016-07-19 DIAGNOSIS — Z9289 Personal history of other medical treatment: Secondary | ICD-10-CM

## 2016-07-19 DIAGNOSIS — J309 Allergic rhinitis, unspecified: Secondary | ICD-10-CM | POA: Insufficient documentation

## 2016-07-19 DIAGNOSIS — F419 Anxiety disorder, unspecified: Secondary | ICD-10-CM | POA: Insufficient documentation

## 2016-07-19 DIAGNOSIS — K5901 Slow transit constipation: Secondary | ICD-10-CM

## 2016-07-19 DIAGNOSIS — M1990 Unspecified osteoarthritis, unspecified site: Secondary | ICD-10-CM | POA: Diagnosis not present

## 2016-07-19 DIAGNOSIS — Z23 Encounter for immunization: Secondary | ICD-10-CM

## 2016-07-19 DIAGNOSIS — K5900 Constipation, unspecified: Secondary | ICD-10-CM | POA: Insufficient documentation

## 2016-07-19 DIAGNOSIS — M4310 Spondylolisthesis, site unspecified: Secondary | ICD-10-CM | POA: Diagnosis not present

## 2016-07-19 DIAGNOSIS — Z0000 Encounter for general adult medical examination without abnormal findings: Secondary | ICD-10-CM

## 2016-07-19 DIAGNOSIS — Z981 Arthrodesis status: Secondary | ICD-10-CM

## 2016-07-19 DIAGNOSIS — J302 Other seasonal allergic rhinitis: Secondary | ICD-10-CM

## 2016-07-19 DIAGNOSIS — R918 Other nonspecific abnormal finding of lung field: Secondary | ICD-10-CM

## 2016-07-19 DIAGNOSIS — R7611 Nonspecific reaction to tuberculin skin test without active tuberculosis: Secondary | ICD-10-CM

## 2016-07-19 DIAGNOSIS — Z9629 Presence of other otological and audiological implants: Secondary | ICD-10-CM

## 2016-07-19 DIAGNOSIS — F411 Generalized anxiety disorder: Secondary | ICD-10-CM

## 2016-07-19 NOTE — Progress Notes (Signed)
Date:  07/19/2016   Name:  Carol [name]   DOB:  1945/07/10   MRN:  [id number]  PCP:  Luna Fuse, MD    Chief Complaint: Establish Care   History of Present Illness:  This is a 71 y.o. female seen for initial visit. Hx venous insufficiency for laser ablation this month. S/p lumbar fusion in Jan, seeing PT for rehab, pain much better, still taking baclofen qam and hydrocodone prn. OA s/p R TKR and L PKR. Hx fibromyalgia intolerant Lyrica. DEXA 2016 showed osteopenia of spine. Anxiety due to alcoholic ex-husband, Paxil helps, takes Klonopin every 1-2 wks only. Infrequent migraines, takes Imitrex then Maxalt prn. HTN and HLD well controlled on chlorthalidone and Crestor x 4 yrs. Seasonal AR well controlled on cetrizine/Flonase. Occ constipation, takes fiber supp qam and prn Colace. Hx lung nodules followed by Dr. Meredeth Ide, hx positive PPD. Has PE tube R ear, followed by ENT. Father died 7 old age, had HTN, mother died 59 brain aneurysm had HTN, brother died 26 lung ca had HTN and ESRD. Tet status unknown, had pneumo imm years ago, had zoster imm 2010. Colonoscopy 4 yrs ago showed polyps, mammo 2 yrs ago ok, sees GYN regularly.  Review of Systems:  Review of Systems  Constitutional: Negative for chills, fatigue and fever.  HENT: Negative for ear pain, sinus pain and trouble swallowing.   Eyes: Negative for pain.  Respiratory: Negative for cough and shortness of breath.   Cardiovascular: Negative for chest pain, palpitations and leg swelling.  Gastrointestinal: Negative for abdominal pain.  Endocrine: Negative for polydipsia and polyuria.  Genitourinary: Negative for difficulty urinating.  Neurological: Negative for tremors, syncope and light-headedness.  Hematological: Negative for adenopathy.    Patient Active Problem List   Diagnosis Date Noted  . Gait instability 07/19/2016  . Hypertension 07/19/2016  . Hyperlipidemia 07/19/2016  . Osteopenia of spine 07/19/2016  .  Varicose veins of left lower extremity with inflammation 05/17/2016  . Chronic venous insufficiency 05/17/2016  . Pain in limb 05/17/2016  . Degenerative spondylolisthesis 03/21/2016  . De Quervain's disease (radial styloid tenosynovitis) 03/07/2015  . Ganglion cyst of dorsum of right wrist 03/07/2015  . Primary osteoarthritis of first carpometacarpal joint of right hand 03/07/2015  . Status post left unicompartmental knee replacement 06/18/2014  . Status post left partial knee replacement 04/07/2014  . Primary osteoarthritis of left knee 01/15/2014  . Fibromyalgia 02/26/2013  . Right hand pain 02/26/2013  . Low back pain 11/21/2012  . Narcotic drug use 11/21/2012  . OA (osteoarthritis) 11/21/2012    Prior to Admission medications   Medication Sig Start Date End Date Taking? Authorizing Provider  baclofen (LIORESAL) 10 MG tablet Take 10 mg by mouth daily.   Yes Historical Provider, MD  carisoprodol (SOMA) 350 MG tablet Take 350 mg by mouth 4 (four) times daily as needed for muscle spasms.   Yes Historical Provider, MD  cetirizine (ZYRTEC) 10 MG tablet Take 10 mg by mouth daily.   Yes Historical Provider, MD  chlorthalidone (HYGROTON) 25 MG tablet Take 25 mg by mouth daily before breakfast.  02/16/16  Yes Historical Provider, MD  clindamycin (CLEOCIN) 150 MG capsule  07/12/16  Yes Historical Provider, MD  clonazePAM (KLONOPIN) 0.5 MG tablet Take 0.5 mg by mouth daily as needed for anxiety.   Yes Historical Provider, MD  escitalopram (LEXAPRO) 10 MG tablet Take 10 mg by mouth daily after breakfast.    Yes Historical Provider, MD  fluticasone (FLONASE) 50 MCG/ACT  nasal spray Place 2 sprays into both nostrils daily.   Yes Historical Provider, MD  HYDROcodone-acetaminophen [facility]) 10-325 MG tablet  04/07/16  Yes Historical Provider, MD  rizatriptan (MAXALT) 10 MG tablet Take 10 mg by mouth as needed. May repeat in 2 hours if needed   Yes Historical Provider, MD  rosuvastatin (CRESTOR) 5 MG  tablet Take 5 mg by mouth every other day. 02/12/16  Yes Historical Provider, MD  SUMAtriptan (IMITREX) 100 MG tablet Take 100 mg by mouth every 2 (two) hours as needed for migraine. May repeat in 2 hours if headache persists or recurs.   Yes Historical Provider, MD    Allergies  Allergen Reactions  . Hydralazine Nausea And Vomiting  . Ibandronic Acid Other (See Comments)    cracked teeth and joint pain  . Penicillins Hives    Has patient had a PCN reaction causing immediate rash, facial/tongue/throat swelling, SOB or lightheadedness with hypotension: No Has patient had a PCN reaction causing severe rash involving mucus membranes or skin necrosis: Yes Has patient had a PCN reaction that required hospitalization No Has patient had a PCN reaction occurring within the last 10 years: No If all of the above answers are "NO", then may proceed with Cephalosporin use.   . Gentamycin [Gentamicin] Rash    Past Surgical History:  Procedure Laterality Date  . ABDOMINAL HYSTERECTOMY    . BACK SURGERY     x3 previous cervical fusion & then removed hardware   . BREAST SURGERY Right    benign-   . HAND TENDON SURGERY Right 2016  . JOINT REPLACEMENT Right    also had a partial on the L   . LUMBAR FUSION    . MYRINGOTOMY WITH TUBE PLACEMENT Right   . TONSILLECTOMY    . TUBAL LIGATION     had a 2nd. surgery for adhesions    Social History  Substance Use Topics  . Smoking status: Former Smoker    Quit date: 03/15/1976  . Smokeless tobacco: Never Used  . Alcohol use No    History reviewed. No pertinent family history.  Medication list has been reviewed and updated.  Physical Examination: BP (!) 138/92   Pulse (!) 103   Resp 16   Ht  (1.6 m)   Wt 168 lb (76.2 kg)   BMI 29.76 kg/m   Physical Exam  Constitutional: She is oriented to person, place, and time. She appears well-developed and well-nourished.  HENT:  Head: Normocephalic and atraumatic.  Right Ear: External ear  normal.  Left Ear: External ear normal.  Nose: Nose normal.  Mouth/Throat: Oropharynx is clear and moist.  Patent PE tube R ear  Eyes: Conjunctivae and EOM are normal. Pupils are equal, round, and reactive to light.  Neck: Neck supple. No thyromegaly present.  Cardiovascular: Normal rate, regular rhythm, normal heart sounds and intact distal pulses.   Pulmonary/Chest: Effort normal and breath sounds normal.  Abdominal: Soft. She exhibits no distension and no mass. There is no tenderness.  Musculoskeletal: She exhibits no edema.  Lymphadenopathy:    She has no cervical adenopathy.  Neurological: She is alert and oriented to person, place, and time. Coordination normal.  Romberg sl unsteady, gait sl shuffling  Skin: Skin is warm and dry.  Psychiatric: She has a normal mood and affect. Her behavior is normal.  Nursing note and vitals reviewed.   Assessment and Plan:  1. Essential hypertension Marginal control today in pain, well controlled last visit, cont chlorthalidone -  Comprehensive Metabolic Panel (CMET) - CBC  2. Chronic venous insufficiency For laser ablation this month  3. Degenerative spondylolisthesis S/p  lumbar fusion with improvement, cont baclofen, hydrocodone per NSGY for now  4. Osteoarthritis, unspecified osteoarthritis type, unspecified site S/p knee replacements, consider Tylenol bid next visit  5. Fibromyalgia Intolerant Lyrica, Crestor may be contributing - TSH  6. Hyperlipidemia, unspecified hyperlipidemia type Unclear control on Crestor, unclear indication for use, consider d/c - Lipid Profile  7. Generalized anxiety disorder Well controlled on Paxil, prn Klonopin  8. Osteopenia of spine Per DEXA 2016 - Vitamin D (25 hydroxy)  9. Slow transit constipation Well controlled on fiber, prn Colace  10. Seasonal allergic rhinitis, unspecified trigger Well controlled on cetrizine/Flonase  11. Gait instability - B12  12. Migraine with aura and  without status migrainosus, not intractable Well controlled on prn Imitrex/Maxalt  13. Patent pressure equalization (PE) tube on right side Followed by ENT  14. Lung nodules Followed by Dr. Meredeth Ide  15. Need for diphtheria-tetanus-pertussis (Tdap) vaccine - Tdap vaccine greater than or equal to 7yo IM  16. Need for pneumococcal vaccination - Pneumococcal conjugate vaccine 13-valent  17. Healthcare maintenance - Hepatitis C Antibody  18. History of positive PPD  19. History of lumbar fusion  20. Hx of fusion of cervical spine  Return in about 4 weeks (around 08/16/2016).   45 minutes spent with patient, over half in counseling  Dionne Ano. Kingsley Spittle MD [facility]  07/19/2016

## 2016-07-20 ENCOUNTER — Other Ambulatory Visit: Payer: Self-pay | Admitting: Family Medicine

## 2016-07-20 ENCOUNTER — Encounter: Payer: Self-pay | Admitting: Family Medicine

## 2016-07-20 DIAGNOSIS — E559 Vitamin D deficiency, unspecified: Secondary | ICD-10-CM | POA: Insufficient documentation

## 2016-07-20 DIAGNOSIS — E538 Deficiency of other specified B group vitamins: Secondary | ICD-10-CM | POA: Insufficient documentation

## 2016-07-20 LAB — CBC
HEMOGLOBIN: 14.2 g/dL (ref 11.1–15.9)
Hematocrit: 43.2 % (ref 34.0–46.6)
MCH: 27.6 pg (ref 26.6–33.0)
MCHC: 32.9 g/dL (ref 31.5–35.7)
MCV: 84 fL (ref 79–97)
PLATELETS: 263 10*3/uL (ref 150–379)
RBC: 5.14 x10E6/uL (ref 3.77–5.28)
RDW: 16.4 % — ABNORMAL HIGH (ref 12.3–15.4)
WBC: 8.3 10*3/uL (ref 3.4–10.8)

## 2016-07-20 LAB — VITAMIN D 25 HYDROXY (VIT D DEFICIENCY, FRACTURES): VIT D 25 HYDROXY: 16.6 ng/mL — AB (ref 30.0–100.0)

## 2016-07-20 LAB — COMPREHENSIVE METABOLIC PANEL
A/G RATIO: 2.1 (ref 1.2–2.2)
ALT: 22 IU/L (ref 0–32)
AST: 21 IU/L (ref 0–40)
Albumin: 4.9 g/dL — ABNORMAL HIGH (ref 3.6–4.8)
Alkaline Phosphatase: 100 IU/L (ref 39–117)
BILIRUBIN TOTAL: 0.3 mg/dL (ref 0.0–1.2)
BUN/Creatinine Ratio: 18 (ref 12–28)
BUN: 17 mg/dL (ref 8–27)
CHLORIDE: 94 mmol/L — AB (ref 96–106)
CO2: 26 mmol/L (ref 18–29)
Calcium: 10.3 mg/dL (ref 8.7–10.3)
Creatinine, Ser: 0.92 mg/dL (ref 0.57–1.00)
GFR calc non Af Amer: 64 mL/min/{1.73_m2} (ref >59)
GFR, EST AFRICAN AMERICAN: 73 mL/min/{1.73_m2} (ref >59)
GLOBULIN, TOTAL: 2.3 g/dL (ref 1.5–4.5)
Glucose: 96 mg/dL (ref 65–99)
POTASSIUM: 3.9 mmol/L (ref 3.5–5.2)
SODIUM: 138 mmol/L (ref 134–144)
Total Protein: 7.2 g/dL (ref 6.0–8.5)

## 2016-07-20 LAB — LIPID PANEL
CHOLESTEROL TOTAL: 178 mg/dL (ref 100–199)
Chol/HDL Ratio: 2.4 ratio (ref 0.0–4.4)
HDL: 73 mg/dL (ref >39)
LDL CALC: 68 mg/dL (ref 0–99)
Triglycerides: 186 mg/dL — ABNORMAL HIGH (ref 0–149)
VLDL CHOLESTEROL CAL: 37 mg/dL (ref 5–40)

## 2016-07-20 LAB — TSH: TSH: 2.14 u[IU]/mL (ref 0.450–4.500)

## 2016-07-20 LAB — HEPATITIS C ANTIBODY: Hep C Virus Ab: <0.1 s/co ratio (ref 0.0–0.9)

## 2016-07-20 LAB — VITAMIN B12: Vitamin B-12: 251 pg/mL (ref 232–1245)

## 2016-07-20 MED ORDER — VITAMIN D 50 MCG (2000 UT) PO CAPS: 1.0000 | ORAL_CAPSULE | Freq: Every day | ORAL | Status: DC

## 2016-07-20 MED ORDER — VITAMIN B-12 1000 MCG PO TABS: 1000.0000 ug | ORAL_TABLET | Freq: Every day | ORAL | Status: DC

## 2016-07-27 ENCOUNTER — Encounter (INDEPENDENT_AMBULATORY_CARE_PROVIDER_SITE_OTHER): Payer: Self-pay | Admitting: Vascular Surgery

## 2016-07-27 ENCOUNTER — Ambulatory Visit (INDEPENDENT_AMBULATORY_CARE_PROVIDER_SITE_OTHER): Payer: Medicare Other | Admitting: Vascular Surgery

## 2016-07-27 ENCOUNTER — Other Ambulatory Visit (INDEPENDENT_AMBULATORY_CARE_PROVIDER_SITE_OTHER): Payer: Self-pay | Admitting: Vascular Surgery

## 2016-07-27 ENCOUNTER — Other Ambulatory Visit (INDEPENDENT_AMBULATORY_CARE_PROVIDER_SITE_OTHER): Payer: Medicare Other | Admitting: Vascular Surgery

## 2016-07-27 VITALS — BP 114/70 | HR 63 | Resp 16 | Ht 63.0 in | Wt 169.0 lb

## 2016-07-27 DIAGNOSIS — I8392 Asymptomatic varicose veins of left lower extremity: Secondary | ICD-10-CM

## 2016-07-27 DIAGNOSIS — I872 Venous insufficiency (chronic) (peripheral): Secondary | ICD-10-CM

## 2016-07-27 DIAGNOSIS — I8312 Varicose veins of left lower extremity with inflammation: Secondary | ICD-10-CM | POA: Diagnosis not present

## 2016-07-27 NOTE — Progress Notes (Signed)
    MRN : [id number]  Beverlee NimsJanet S Barbara is a 71 y.o. (08-Dec-1945) female who presents with chief complaint of  Chief Complaint  Patient presents with  . Varicose Veins    Left GSV laser ablation  .    The patient's left lower extremity was sterilely prepped and draped.  The ultrasound machine was used to visualize the left great saphenous vein throughout its course.  A segment above the knee was selected for access.  The saphenous vein was accessed without difficulty using ultrasound guidance with a micropuncture needle.   An 0.018  wire was placed beyond the saphenofemoral junction through the sheath and the microneedle was removed.  The 65 cm sheath was then placed over the wire and the wire and dilator were removed.  The laser fiber was placed through the sheath and its tip was placed approximately 2 cm below the saphenofemoral junction.  Tumescent anesthesia was then created with a dilute lidocaine solution.  Laser energy was then delivered with constant withdrawal of the sheath and laser fiber.  Approximately 750 Joules of energy were delivered over a length of 14 cm.  Sterile dressings were placed.  The patient tolerated the procedure well without complications.

## 2016-07-31 ENCOUNTER — Ambulatory Visit (INDEPENDENT_AMBULATORY_CARE_PROVIDER_SITE_OTHER): Payer: Medicare Other

## 2016-07-31 DIAGNOSIS — I8392 Asymptomatic varicose veins of left lower extremity: Secondary | ICD-10-CM

## 2016-08-18 NOTE — Anesthesia Postprocedure Evaluation (Signed)
Anesthesia Post Note  Patient: Carol [name]  Procedure(s) Performed: Procedure(s) (LRB): Posterior Lumbar Interbody Fusion Lumbar two-three, Lumbar three-four, Lumbar four-five (N/A)     Anesthesia Post Evaluation  Last Vitals:  Vitals:   03/22/16 0501 03/22/16 0754  BP: 109/78 118/75  Pulse: (!) 106 94  Resp: 20 18  Temp: 37 C 36.8 C    Last Pain:  Vitals:   03/22/16 1040  TempSrc:   PainSc: 3                  Shelton SilvasKevin D Toma Arts

## 2016-08-18 NOTE — Addendum Note (Signed)
Addendum  created 08/18/16 [id number] by Shelton SilvasHollis, Ceylin Dreibelbis D, MD   Sign clinical note

## 2016-08-23 ENCOUNTER — Ambulatory Visit: Payer: Medicare Other | Admitting: Family Medicine

## 2016-08-25 ENCOUNTER — Encounter: Payer: Self-pay | Admitting: Family Medicine

## 2016-08-25 ENCOUNTER — Ambulatory Visit (INDEPENDENT_AMBULATORY_CARE_PROVIDER_SITE_OTHER): Payer: Medicare Other | Admitting: Family Medicine

## 2016-08-25 VITALS — BP 120/70 | HR 64 | Ht 63.0 in | Wt 168.0 lb

## 2016-08-25 DIAGNOSIS — F329 Major depressive disorder, single episode, unspecified: Secondary | ICD-10-CM | POA: Diagnosis not present

## 2016-08-25 DIAGNOSIS — G8929 Other chronic pain: Secondary | ICD-10-CM | POA: Diagnosis not present

## 2016-08-25 DIAGNOSIS — I10 Essential (primary) hypertension: Secondary | ICD-10-CM | POA: Diagnosis not present

## 2016-08-25 DIAGNOSIS — E559 Vitamin D deficiency, unspecified: Secondary | ICD-10-CM

## 2016-08-25 DIAGNOSIS — F32A Depression, unspecified: Secondary | ICD-10-CM | POA: Insufficient documentation

## 2016-08-25 DIAGNOSIS — E538 Deficiency of other specified B group vitamins: Secondary | ICD-10-CM

## 2016-08-25 DIAGNOSIS — M545 Low back pain: Secondary | ICD-10-CM | POA: Diagnosis not present

## 2016-08-25 MED ORDER — ESCITALOPRAM OXALATE 20 MG PO TABS: 20.0000 mg | ORAL_TABLET | Freq: Every day | ORAL | 3 refills | Status: DC

## 2016-08-25 NOTE — Progress Notes (Signed)
Date:  08/25/2016   Name:  Carol NimsJanet S Knowlton   DOB:  Mar 17, 1946   MRN:  [id number]  PCP:  Sherron Mondayejan-Sie, S Ahmed, MD    Chief Complaint: Follow-up (wants to increase on Escitalopram- been irritable)   History of Present Illness:  This is a 71 y.o. female seen in one month f/u from initial visit. Off Crestor, hip and thigh pain better. On vit D and B12 supplements. C/o more irritable lately, wants to increase Lexapro dose. S/p back surgery in Jan, still on baclofen daily, uses Vicodin prn only. Losing weight, migraines well controlled.  Review of Systems:  Review of Systems  Constitutional: Negative for chills and fever.  Respiratory: Negative for cough and shortness of breath.   Cardiovascular: Negative for chest pain and leg swelling.  Gastrointestinal: Negative for abdominal pain.  Genitourinary: Negative for difficulty urinating.  Neurological: Negative for syncope and light-headedness.    Patient Active Problem List   Diagnosis Date Noted  . Depression 08/25/2016  . Vitamin D deficiency 07/20/2016  . B12 deficiency 07/20/2016  . Gait instability 07/19/2016  . Hypertension 07/19/2016  . Hyperlipidemia 07/19/2016  . Osteopenia of spine 07/19/2016  . History of lumbar fusion 07/19/2016  . Hx of fusion of cervical spine 07/19/2016  . Migraine headache 07/19/2016  . Anxiety disorder 07/19/2016  . Allergic rhinitis 07/19/2016  . Constipation 07/19/2016  . Lung nodules 07/19/2016  . History of positive PPD 07/19/2016  . Patent pressure equalization (PE) tube on right side 07/19/2016  . Varicose veins of left lower extremity with inflammation 05/17/2016  . Chronic venous insufficiency 05/17/2016  . Pain in limb 05/17/2016  . Degenerative spondylolisthesis 03/21/2016  . De Quervain's disease (radial styloid tenosynovitis) 03/07/2015  . Ganglion cyst of dorsum of right wrist 03/07/2015  . Primary osteoarthritis of first carpometacarpal joint of right hand 03/07/2015  . Status post  left unicompartmental knee replacement 06/18/2014  . Status post left partial knee replacement 04/07/2014  . Primary osteoarthritis of left knee 01/15/2014  . Fibromyalgia 02/26/2013  . Right hand pain 02/26/2013  . Low back pain 11/21/2012  . Narcotic drug use 11/21/2012  . OA (osteoarthritis) 11/21/2012    Prior to Admission medications   Medication Sig Start Date End Date Taking? Authorizing Provider  baclofen (LIORESAL) 10 MG tablet Take 10 mg by mouth daily.   Yes [provider]  carisoprodol (SOMA) 350 MG tablet Take 350 mg by mouth 4 (four) times daily as needed for muscle spasms.   Yes [provider]  cetirizine (ZYRTEC) 10 MG tablet Take 10 mg by mouth daily.   Yes [provider]  chlorthalidone (HYGROTON) 25 MG tablet Take 25 mg by mouth daily before breakfast.  02/16/16  Yes [provider]  Cholecalciferol (VITAMIN D) 2000 units CAPS Take 1 capsule (2,000 Units total) by mouth daily. 07/20/16  Yes Shanika Levings, Chrissie NoaWilliam, MD  clindamycin (CLEOCIN) 150 MG capsule  07/12/16  Yes [provider]  clonazePAM (KLONOPIN) 0.5 MG tablet Take 0.5 mg by mouth daily as needed for anxiety.   Yes [provider]  diclofenac sodium (VOLTAREN) 1 % GEL Apply 1 application topically 4 (four) times daily. Dr Kathi LudwigSyed   Yes [provider]  escitalopram (LEXAPRO) 20 MG tablet Take 1 tablet (20 mg total) by mouth daily after breakfast. 08/25/16  Yes Kolyn Rozario, Chrissie NoaWilliam, MD  fluticasone (FLONASE) 50 MCG/ACT nasal spray Place 2 sprays into both nostrils daily.   Yes [provider]  HYDROcodone-acetaminophen (NORCO) 10-325 MG  tablet  04/07/16  Yes [provider]  rizatriptan (MAXALT) 10 MG tablet Take 10 mg by mouth as needed. May repeat in 2 hours if needed   Yes [provider]  SUMAtriptan (IMITREX) 100 MG tablet Take 100 mg by mouth every 2 (two) hours as needed for migraine. May repeat in 2 hours if headache persists or recurs.    Yes [provider]  vitamin B-12 (CYANOCOBALAMIN) 1000 MCG tablet Take 1 tablet (1,000 mcg total) by mouth daily. 07/20/16  Yes Khole Branch, Chrissie Noa, MD    Allergies  Allergen Reactions  . Hydralazine Nausea And Vomiting  . Ibandronic Acid Other (See Comments)    cracked teeth and joint pain  . Penicillins Hives    Has patient had a PCN reaction causing immediate rash, facial/tongue/throat swelling, SOB or lightheadedness with hypotension: No Has patient had a PCN reaction causing severe rash involving mucus membranes or skin necrosis: Yes Has patient had a PCN reaction that required hospitalization No Has patient had a PCN reaction occurring within the last 10 years: No If all of the above answers are "NO", then may proceed with Cephalosporin use.   . Gentamycin [Gentamicin] Rash    Past Surgical History:  Procedure Laterality Date  . ABDOMINAL HYSTERECTOMY    . BACK SURGERY     x3 previous cervical fusion & then removed hardware   . BREAST SURGERY Right    benign-   . HAND TENDON SURGERY Right 2016  . JOINT REPLACEMENT Right    also had a partial on the L   . LUMBAR FUSION    . MYRINGOTOMY WITH TUBE PLACEMENT Right   . TONSILLECTOMY    . TUBAL LIGATION     had a 2nd. surgery for adhesions    Social History  Substance Use Topics  . Smoking status: Former Smoker    Quit date: 03/15/1976  . Smokeless tobacco: Never Used  . Alcohol use No    No family history on file.  Medication list has been reviewed and updated.  Physical Examination: BP 120/70   Pulse 64   Ht 5\' 3"  (1.6 m)   Wt 168 lb (76.2 kg)   BMI 29.76 kg/m   Physical Exam  Constitutional: She appears well-developed and well-nourished.  Cardiovascular: Normal rate, regular rhythm and normal heart sounds.   Pulmonary/Chest: Effort normal and breath sounds normal.  Musculoskeletal: She exhibits no edema.  Neurological: She is alert.  Skin: Skin is warm and dry.  Psychiatric: She has a normal mood  and affect. Her behavior is normal.  Nursing note and vitals reviewed.   Assessment and Plan:  1. Depression, unspecified depression type Marginal control, increase Lexapro to 20 mg daily  2. Essential hypertension Well controlled on chlorthalidone  3. Chronic low back pain without sciatica, unspecified back pain laterality Cont baclofen and Vicodin prn per NSGY  4. Vitamin D deficiency On supplement, consider level next visit  5. B12 deficiency On supplement, consider level next visit  Return in about 3 months (around 11/25/2016).  Dionne Ano. Kingsley Spittle MD [facility]  08/25/2016

## 2016-09-09 ENCOUNTER — Ambulatory Visit
Admission: EM | Admit: 2016-09-09 | Discharge: 2016-09-09 | Disposition: A | Discharge status: Home / Self Care | Payer: Medicare Other | Attending: Family | Admitting: Family

## 2016-09-09 DIAGNOSIS — B029 Zoster without complications: Secondary | ICD-10-CM

## 2016-09-09 MED ORDER — VALACYCLOVIR HCL 1 G PO TABS: 1000.0000 mg | ORAL_TABLET | Freq: Three times a day (TID) | ORAL | 0 refills | Status: AC

## 2016-09-09 NOTE — Discharge Instructions (Signed)
Recommend start Valtrex 1g three times a day as directed. Follow-up here in 1 to 2 days if rash changes or gets worse.

## 2016-09-09 NOTE — ED Triage Notes (Signed)
Pt noticed a red area to right of buttocks crack near sacrum, several small blisters noted. Pain 4/10

## 2016-09-09 NOTE — ED Provider Notes (Signed)
CSN: [id number]     Arrival date & time 09/09/16  1541 History   First MD Initiated Contact with Patient 09/09/16 1647     Chief Complaint  Patient presents with  . Rash   (Consider location/radiation/quality/duration/timing/severity/associated sxs/prior Treatment) 71 year old female accompanied by her husband with concern over rash/blisters on right side of buttocks that started yesterday morning. Uncertain if she was bit by an insect. Area is sensitive to touch but not very painful. Denies any itching, fever, or discharge. More blisters and redness appeared today which caused more concern. Husband has tried applying topical antibiotic ointment to area with minimal relief. She did have the Shingles vaccine a few years ago. Had recent back surgery in January 2018 and on medication for HTN, arthritis and anxiety.    The history is provided by the patient and the spouse.    Past Medical History:  Diagnosis Date  . Anxiety   . Arthritis   . Depression   . Headache    migraine - last one 2016  . Hypertension    reason that she was given Hygroton & she remarks that her BP has been better she has been taking it.   Marland Kitchen Positive skin test for tuberculosis    told that she is a carrier,    Past Surgical History:  Procedure Laterality Date  . ABDOMINAL HYSTERECTOMY    . BACK SURGERY     x3 previous cervical fusion & then removed hardware   . BREAST SURGERY Right    benign-   . HAND TENDON SURGERY Right 2016  . JOINT REPLACEMENT Right    also had a partial on the L   . LUMBAR FUSION    . MYRINGOTOMY WITH TUBE PLACEMENT Right   . TONSILLECTOMY    . TUBAL LIGATION     had a 2nd. surgery for adhesions   History reviewed. No pertinent family history. Social History  Substance Use Topics  . Smoking status: Former Smoker    Types: Cigarettes    Quit date: 03/15/1976  . Smokeless tobacco: Never Used  . Alcohol use No   OB History    No data available     Review of Systems    Constitutional: Positive for fatigue. Negative for activity change, appetite change, chills and fever.  HENT: Negative for congestion and sore throat.   Respiratory: Negative for chest tightness, shortness of breath and wheezing.   Gastrointestinal: Negative for abdominal pain, anal bleeding, constipation, nausea and vomiting.  Genitourinary: Negative for decreased urine volume, difficulty urinating and pelvic pain.  Musculoskeletal: Negative for arthralgias and myalgias.  Skin: Positive for rash.  Neurological: Negative for dizziness, syncope, weakness, light-headedness and headaches.  Hematological: Negative for adenopathy.    Allergies  Hydralazine; Ibandronic acid; Penicillins; and Gentamycin [gentamicin]  Home Medications   Prior to Admission medications   Medication Sig Start Date End Date Taking? Authorizing Provider  baclofen (LIORESAL) 10 MG tablet Take 10 mg by mouth daily.    [provider]  cetirizine (ZYRTEC) 10 MG tablet Take 10 mg by mouth daily.    [provider]  chlorthalidone (HYGROTON) 25 MG tablet Take 25 mg by mouth daily before breakfast.  02/16/16   [provider]  Cholecalciferol (VITAMIN D) 2000 units CAPS Take 1 capsule (2,000 Units total) by mouth daily. 07/20/16   Plonk, Chrissie Noa, MD  clonazePAM (KLONOPIN) 0.5 MG tablet Take 0.5 mg by mouth daily as needed for anxiety.    [provider]  diclofenac sodium (VOLTAREN) 1 % GEL Apply 1 application topically 4 (four) times daily. Dr Kathi LudwigSyed    [provider]  escitalopram (LEXAPRO) 20 MG tablet Take 1 tablet (20 mg total) by mouth daily after breakfast. 08/25/16   Plonk, Chrissie NoaWilliam, MD  fluticasone (FLONASE) 50 MCG/ACT nasal spray Place 2 sprays into both nostrils daily.    [provider]  HYDROcodone-acetaminophen [facility](NORCO) 10-325 MG tablet  04/07/16   [provider]  rizatriptan (MAXALT) 10 MG tablet Take 10 mg by mouth as needed. May repeat in 2 hours if  needed    [provider]  SUMAtriptan (IMITREX) 100 MG tablet Take 100 mg by mouth every 2 (two) hours as needed for migraine. May repeat in 2 hours if headache persists or recurs.    [provider]  valACYclovir (VALTREX) 1000 MG tablet Take 1 tablet (1,000 mg total) by mouth 3 (three) times daily. 09/09/16 09/16/16  Sudie GrumblingAmyot, Clemencia Helzer Berry, NP  vitamin B-12 (CYANOCOBALAMIN) 1000 MCG tablet Take 1 tablet (1,000 mcg total) by mouth daily. 07/20/16   Plonk, Chrissie NoaWilliam, MD   Meds Ordered and Administered this Visit  Medications - No data to display  BP (!) 113/59 (BP Location: Left Arm)   Pulse 74   Temp 98.4 F (36.9 C) (Oral)   Resp 18   Ht 5\' 3"  (1.6 m)   Wt 165 lb (74.8 kg)   SpO2 100%   BMI 29.23 kg/m  No data found.   Physical Exam  Constitutional: She is oriented to person, place, and time. She appears well-developed and well-nourished. No distress.  HENT:  Head: Normocephalic and atraumatic.  Right Ear: External ear normal.  Left Ear: External ear normal.  Mouth/Throat: Oropharynx is clear and moist.  Neck: Normal range of motion.  Cardiovascular: Normal rate and regular rhythm.   Pulmonary/Chest: Effort normal.  Musculoskeletal: Normal range of motion.  Neurological: She is alert and oriented to person, place, and time.  Skin: Skin is warm and dry. Rash noted. No ecchymosis and no petechiae noted. Rash is vesicular. Rash is not pustular. There is erythema.     Cluster of 5 vesicles about 2mm each present on right upper inner buttock area just below sacrum. Clear fluid present in vesicles on an erythematous base. Slightly tender. No discharge. No bleeding. No crusting. No other lesions present.   Psychiatric: She has a normal mood and affect. Her behavior is normal. Judgment and thought content normal.    Urgent Care Course     Procedures (including critical care time)  Labs Review Labs Reviewed - No data to display  Imaging Review No results  found.   Visual Acuity Review  Right Eye Distance:   Left Eye Distance:   Bilateral Distance:    Right Eye Near:   Left Eye Near:    Bilateral Near:         MDM   1. Herpes zoster without complication    Discussed with patient and her husband that the rash appears to be Shingles. Reviewed that a contact dermatitis may also appear similar but unlikely since no other lesions present on her body. Also discussed that the area does not appear to be a result of a tick bite. Discussed that you can still get Shingles despite getting the vaccine- may be a milder case. Recommend start Valtrex 1g 3 times a day as directed. May clean area with soap and water as usual. Follow-up here tomorrow if rash worsens or in  2 to 3 days with her primary care provider if rash does not start resolving.     Sudie Grumbling, NP 09/09/16 1927

## 2016-09-25 ENCOUNTER — Other Ambulatory Visit: Payer: Self-pay | Admitting: Family Medicine

## 2016-09-25 MED ORDER — CHLORTHALIDONE 25 MG PO TABS: 25.0000 mg | ORAL_TABLET | Freq: Every day | ORAL | 3 refills | Status: DC

## 2016-10-09 ENCOUNTER — Ambulatory Visit (INDEPENDENT_AMBULATORY_CARE_PROVIDER_SITE_OTHER): Payer: Medicare Other

## 2016-10-09 VITALS — BP 138/80 | HR 82 | Temp 98.3°F | Resp 16 | Ht 63.0 in | Wt 169.8 lb

## 2016-10-09 DIAGNOSIS — Z0000 Encounter for general adult medical examination without abnormal findings: Secondary | ICD-10-CM

## 2016-10-09 NOTE — Progress Notes (Signed)
Subjective:   Carol [name] is a 71 y.o. female who presents for Medicare Annual (Subsequent) preventive examination.  Review of Systems:   Cardiac Risk Factors include: hypertension;dyslipidemia;advanced age (>74men, >54 women);obesity (BMI >30kg/m2)     Objective:     Vitals: BP 138/80 (BP Location: Left Arm, Patient Position: Sitting)   Pulse 82   Temp 98.3 F (36.8 C)   Resp 16   Ht 5\' 3"  (1.6 m)   Wt 169 lb 12.8 oz (77 kg)   BMI 30.08 kg/m   Body mass index is 30.08 kg/m.   Tobacco History  Smoking Status  . Former Smoker  . Types: Cigarettes  . Quit date: 03/15/1976  Smokeless Tobacco  . Never Used     Counseling given: Not Answered   Past Medical History:  Diagnosis Date  . Anxiety   . Arthritis   . Depression   . Headache    migraine - last one 2016  . Hypertension    reason that she was given Hygroton & she remarks that her BP has been better she has been taking it.   Marland Kitchen Positive skin test for tuberculosis    told that she is a carrier,    Past Surgical History:  Procedure Laterality Date  . ABDOMINAL HYSTERECTOMY    . BACK SURGERY     x3 previous cervical fusion & then removed hardware   . BREAST SURGERY Right    benign-   . HAND TENDON SURGERY Right 2016  . JOINT REPLACEMENT Right    also had a partial on the L   . LUMBAR FUSION    . MYRINGOTOMY WITH TUBE PLACEMENT Right   . TONSILLECTOMY    . TUBAL LIGATION     had a 2nd. surgery for adhesions   Family History  Problem Relation Age of Onset  . Aneurysm Mother   . Lung cancer Brother    History  Sexual Activity  . Sexual activity: Not on file    Outpatient Encounter Prescriptions as of 10/09/2016  Medication Sig  . baclofen (LIORESAL) 10 MG tablet Take 10 mg by mouth daily.  . cetirizine (ZYRTEC) 10 MG tablet Take 10 mg by mouth daily.  . chlorthalidone (HYGROTON) 25 MG tablet Take 1 tablet (25 mg total) by mouth daily before breakfast.  . Cholecalciferol (VITAMIN D) 2000  units CAPS Take 1 capsule (2,000 Units total) by mouth daily.  . clonazePAM (KLONOPIN) 0.5 MG tablet Take 0.5 mg by mouth daily as needed for anxiety.  . diclofenac sodium (VOLTAREN) 1 % GEL Apply 1 application topically 4 (four) times daily. Dr Kathi Ludwig  . escitalopram (LEXAPRO) 20 MG tablet Take 1 tablet (20 mg total) by mouth daily after breakfast.  . fluticasone (FLONASE) 50 MCG/ACT nasal spray Place 2 sprays into both nostrils daily.  Marland Kitchen HYDROcodone-acetaminophen (NORCO) 10-325 MG tablet   . rizatriptan (MAXALT) 10 MG tablet Take 10 mg by mouth as needed. May repeat in 2 hours if needed  . SUMAtriptan (IMITREX) 100 MG tablet Take 100 mg by mouth every 2 (two) hours as needed for migraine. May repeat in 2 hours if headache persists or recurs.  . vitamin B-12 (CYANOCOBALAMIN) 1000 MCG tablet Take 1 tablet (1,000 mcg total) by mouth daily.   No facility-administered encounter medications on file as of 10/09/2016.     Activities of Daily Living In your present state of health, do you have any difficulty performing the following activities: 10/09/2016 03/15/2016  Hearing? Malvin Johns  Vision? N N  Difficulty concentrating or making decisions? N N  Walking or climbing stairs? Y Y  Dressing or bathing? N N  Doing errands, shopping? N -  Preparing Food and eating ? N -  Using the Toilet? N -  In the past six months, have you accidently leaked urine? N -  Do you have problems with loss of bowel control? N -  Managing your Medications? N -  Managing your Finances? N -  Housekeeping or managing your Housekeeping? N -  Some recent data might be hidden    Patient Care Team: Schuyler AmorPlonk, William, MD as PCP - General (Family Medicine) Schuyler AmorPlonk, William, MD as Referring Physician (Family Medicine)    Assessment:     Exercise Activities and Dietary recommendations Current Exercise Habits: Home exercise routine, Type of exercise: stretching;Other - see comments (work out video ), Time (Minutes): 60, Frequency  (Times/Week): 7, Weekly Exercise (Minutes/Week): 420, Intensity: Mild, Exercise limited by: orthopedic condition(s)  Goals    . Increase water intake          Recommend drinking at least 5-6 glasses of water a day       Fall Risk Fall Risk  10/09/2016 07/19/2016  Falls in the past year? No No   Depression Screen PHQ 2/9 Scores 10/09/2016 07/19/2016  PHQ - 2 Score 0 0     Cognitive Function     6CIT Screen 10/09/2016  What Year? 0 points  What month? 0 points  What time? 0 points  Count back from 20 0 points  Months in reverse 0 points  Repeat phrase 0 points  Total Score 0    Immunization History  Administered Date(s) Administered  . Pneumococcal Conjugate-13 07/19/2016  . Tdap 07/19/2016  . Zoster 06/05/2008   Screening Tests Health Maintenance  Topic Date Due  . INFLUENZA VACCINE  10/18/2016  . PNA vac Low Risk Adult (2 of 2 - PPSV23) 07/19/2017  . MAMMOGRAM  08/16/2018  . COLONOSCOPY  03/20/2024  . TETANUS/TDAP  07/20/2026  . DEXA SCAN  Completed  . Hepatitis C Screening  Completed      Plan:     I have personally reviewed and addressed the Medicare Annual Wellness questionnaire and have noted the following in the patient's chart:  A. Medical and social history B. Use of alcohol, tobacco or illicit drugs  C. Current medications and supplements D. Functional ability and status E.  Nutritional status F.  Physical activity G. Advance directives H. List of other physicians I.  Hospitalizations, surgeries, and ER visits in previous 12 months J.  Vitals K. Screenings such as hearing and vision if needed, cognitive and depression L. Referrals and appointments  In addition, I have reviewed and discussed with patient certain preventive protocols, quality metrics, and best practice recommendations. A written personalized care plan for preventive services as well as general preventive health recommendations were provided to patient.   Signed,  Marin Robertsiffany Tiernan Suto,  LPN Nurse Health Advisor   MD Recommendations: none

## 2016-10-09 NOTE — Patient Instructions (Signed)
Carol [name] , Thank you for taking time to come for your Medicare Wellness Visit. I appreciate your ongoing commitment to your health goals. Please review the following plan we discussed and let me know if I can assist you in the future.   Screening recommendations/referrals: Colonoscopy: completed 03/20/2014 Mammogram: completed 08/15/2016 Bone Density: completed 04/11/2011 Recommended yearly ophthalmology/optometry visit for glaucoma screening and checkup Recommended yearly dental visit for hygiene and checkup  Vaccinations: Influenza vaccine: due 11/2016 Pneumococcal vaccine: pneumovax 23 due 07/2017 Tdap vaccine: up to date Shingles vaccine: up to date  Advanced directives: Advance directive discussed with you today. I have provided a copy for you to complete at home and have notarized. Once this is complete please bring a copy in to our office so we can scan it into your chart.  Conditions/risks identified: Recommend drinking at least 5-6 glasses of water a day   Next appointment: Follow up on 11/30/2016 at 2:30pm with Dr.Plonk. Follow up in one year for your annual wellness exam.   Preventive Care 69 Years and Older, Female Preventive care refers to lifestyle choices and visits with your health care provider that can promote health and wellness. What does preventive care include?  A yearly physical exam. This is also called an annual well check.  Dental exams once or twice a year.  Routine eye exams. Ask your health care provider how often you should have your eyes checked.  Personal lifestyle choices, including:  Daily care of your teeth and gums.  Regular physical activity.  Eating a healthy diet.  Avoiding tobacco and drug use.  Limiting alcohol use.  Practicing safe sex.  Taking low-dose aspirin every day.  Taking vitamin and mineral supplements as recommended by your health care provider. What happens during an annual well check? The services and screenings done  by your health care provider during your annual well check will depend on your age, overall health, lifestyle risk factors, and family history of disease. Counseling  Your health care provider may ask you questions about your:  Alcohol use.  Tobacco use.  Drug use.  Emotional well-being.  Home and relationship well-being.  Sexual activity.  Eating habits.  History of falls.  Memory and ability to understand (cognition).  Work and work Astronomerenvironment.  Reproductive health. Screening  You may have the following tests or measurements:  Height, weight, and BMI.  Blood pressure.  Lipid and cholesterol levels. These may be checked every 5 years, or more frequently if you are over 69 years old.  Skin check.  Lung cancer screening. You may have this screening every year starting at age 69 if you have a 30-pack-year history of smoking and currently smoke or have quit within the past 15 years.  Fecal occult blood test (FOBT) of the stool. You may have this test every year starting at age 69.  Flexible sigmoidoscopy or colonoscopy. You may have a sigmoidoscopy every 5 years or a colonoscopy every 10 years starting at age 69.  Hepatitis C blood test.  Hepatitis B blood test.  Sexually transmitted disease (STD) testing.  Diabetes screening. This is done by checking your blood sugar (glucose) after you have not eaten for a while (fasting). You may have this done every 1-3 years.  Bone density scan. This is done to screen for osteoporosis. You may have this done starting at age 69.  Mammogram. This may be done every 1-2 years. Talk to your health care provider about how often you should have regular mammograms.  Talk with your health care provider about your test results, treatment options, and if necessary, the need for more tests. Vaccines  Your health care provider may recommend certain vaccines, such as:  Influenza vaccine. This is recommended every year.  Tetanus,  diphtheria, and acellular pertussis (Tdap, Td) vaccine. You may need a Td booster every 10 years.  Zoster vaccine. You may need this after age 69.  Pneumococcal 13-valent conjugate (PCV13) vaccine. One dose is recommended after age 69.  Pneumococcal polysaccharide (PPSV23) vaccine. One dose is recommended after age 69. Talk to your health care provider about which screenings and vaccines you need and how often you need them. This information is not intended to replace advice given to you by your health care provider. Make sure you discuss any questions you have with your health care provider. Document Released: 04/02/2015 Document Revised: 11/24/2015 Document Reviewed: 01/05/2015 Elsevier Interactive Patient Education  2017 [place] Prevention in the Home Falls can cause injuries. They can happen to people of all ages. There are many things you can do to make your home safe and to help prevent falls. What can I do on the outside of my home?  Regularly fix the edges of walkways and driveways and fix any cracks.  Remove anything that might make you trip as you walk through a door, such as a raised step or threshold.  Trim any bushes or trees on the path to your home.  Use bright outdoor lighting.  Clear any walking paths of anything that might make someone trip, such as rocks or tools.  Regularly check to see if handrails are loose or broken. Make sure that both sides of any steps have handrails.  Any raised decks and porches should have guardrails on the edges.  Have any leaves, snow, or ice cleared regularly.  Use sand or salt on walking paths during winter.  Clean up any spills in your garage right away. This includes oil or grease spills. What can I do in the bathroom?  Use night lights.  Install grab bars by the toilet and in the tub and shower. Do not use towel bars as grab bars.  Use non-skid mats or decals in the tub or shower.  If you need to sit down in  the shower, use a plastic, non-slip stool.  Keep the floor dry. Clean up any water that spills on the floor as soon as it happens.  Remove soap buildup in the tub or shower regularly.  Attach bath mats securely with double-sided non-slip rug tape.  Do not have throw rugs and other things on the floor that can make you trip. What can I do in the bedroom?  Use night lights.  Make sure that you have a light by your bed that is easy to reach.  Do not use any sheets or blankets that are too big for your bed. They should not hang down onto the floor.  Have a firm chair that has side arms. You can use this for support while you get dressed.  Do not have throw rugs and other things on the floor that can make you trip. What can I do in the kitchen?  Clean up any spills right away.  Avoid walking on wet floors.  Keep items that you use a lot in easy-to-reach places.  If you need to reach something above you, use a strong step stool that has a grab bar.  Keep electrical cords out of the way.  Do not use floor polish or wax that makes floors slippery. If you must use wax, use non-skid floor wax.  Do not have throw rugs and other things on the floor that can make you trip. What can I do with my stairs?  Do not leave any items on the stairs.  Make sure that there are handrails on both sides of the stairs and use them. Fix handrails that are broken or loose. Make sure that handrails are as long as the stairways.  Check any carpeting to make sure that it is firmly attached to the stairs. Fix any carpet that is loose or worn.  Avoid having throw rugs at the top or bottom of the stairs. If you do have throw rugs, attach them to the floor with carpet tape.  Make sure that you have a light switch at the top of the stairs and the bottom of the stairs. If you do not have them, ask someone to add them for you. What else can I do to help prevent falls?  Wear shoes that:  Do not have high  heels.  Have rubber bottoms.  Are comfortable and fit you well.  Are closed at the toe. Do not wear sandals.  If you use a stepladder:  Make sure that it is fully opened. Do not climb a closed stepladder.  Make sure that both sides of the stepladder are locked into place.  Ask someone to hold it for you, if possible.  Clearly mark and make sure that you can see:  Any grab bars or handrails.  First and last steps.  Where the edge of each step is.  Use tools that help you move around (mobility aids) if they are needed. These include:  Canes.  Walkers.  Scooters.  Crutches.  Turn on the lights when you go into a dark area. Replace any light bulbs as soon as they burn out.  Set up your furniture so you have a clear path. Avoid moving your furniture around.  If any of your floors are uneven, fix them.  If there are any pets around you, be aware of where they are.  Review your medicines with your doctor. Some medicines can make you feel dizzy. This can increase your chance of falling. Ask your doctor what other things that you can do to help prevent falls. This information is not intended to replace advice given to you by your health care provider. Make sure you discuss any questions you have with your health care provider. Document Released: 12/31/2008 Document Revised: 08/12/2015 Document Reviewed: 04/10/2014 Elsevier Interactive Patient Education  2017 [employer].

## 2016-11-23 ENCOUNTER — Other Ambulatory Visit: Payer: Self-pay | Admitting: Family Medicine

## 2016-11-23 MED ORDER — SUMATRIPTAN SUCCINATE 100 MG PO TABS: 100.0000 mg | ORAL_TABLET | Freq: Every day | ORAL | 2 refills | Status: AC | PRN

## 2016-11-23 MED ORDER — CETIRIZINE HCL 10 MG PO TABS: 10.0000 mg | ORAL_TABLET | Freq: Every day | ORAL | 2 refills | Status: DC | PRN

## 2016-11-28 DIAGNOSIS — M461 Sacroiliitis, not elsewhere classified: Secondary | ICD-10-CM | POA: Insufficient documentation

## 2016-11-30 ENCOUNTER — Ambulatory Visit: Payer: Medicare Other | Admitting: Family Medicine

## 2016-11-30 DIAGNOSIS — M21169 Varus deformity, not elsewhere classified, unspecified knee: Secondary | ICD-10-CM | POA: Insufficient documentation

## 2016-12-08 ENCOUNTER — Ambulatory Visit: Payer: Medicare Other | Admitting: Family Medicine

## 2017-03-01 ENCOUNTER — Ambulatory Visit (INDEPENDENT_AMBULATORY_CARE_PROVIDER_SITE_OTHER): Payer: Medicare Other | Admitting: Family Medicine

## 2017-03-01 ENCOUNTER — Encounter: Payer: Self-pay | Admitting: Family Medicine

## 2017-03-01 VITALS — BP 132/82 | HR 98 | Resp 16 | Ht 63.0 in | Wt 169.0 lb

## 2017-03-01 DIAGNOSIS — J302 Other seasonal allergic rhinitis: Secondary | ICD-10-CM | POA: Diagnosis not present

## 2017-03-01 DIAGNOSIS — E559 Vitamin D deficiency, unspecified: Secondary | ICD-10-CM | POA: Diagnosis not present

## 2017-03-01 DIAGNOSIS — E538 Deficiency of other specified B group vitamins: Secondary | ICD-10-CM

## 2017-03-01 DIAGNOSIS — I10 Essential (primary) hypertension: Secondary | ICD-10-CM | POA: Diagnosis not present

## 2017-03-01 DIAGNOSIS — Z23 Encounter for immunization: Secondary | ICD-10-CM | POA: Diagnosis not present

## 2017-03-01 DIAGNOSIS — F329 Major depressive disorder, single episode, unspecified: Secondary | ICD-10-CM | POA: Diagnosis not present

## 2017-03-01 DIAGNOSIS — F32A Depression, unspecified: Secondary | ICD-10-CM

## 2017-03-01 DIAGNOSIS — G43109 Migraine with aura, not intractable, without status migrainosus: Secondary | ICD-10-CM

## 2017-03-01 MED ORDER — CETIRIZINE HCL 10 MG PO TABS: 10.0000 mg | ORAL_TABLET | Freq: Every day | ORAL | 3 refills | Status: DC | PRN

## 2017-03-01 MED ORDER — ZOSTER VAC RECOMB ADJUVANTED 50 MCG/0.5ML IM SUSR: 0.5000 mL | Freq: Once | INTRAMUSCULAR | 1 refills | Status: AC

## 2017-03-01 NOTE — Progress Notes (Signed)
Date:  03/01/2017   Name:  Carol [name]   DOB:  1945-08-12   MRN:  [id number]  PCP:  Schuyler AmorPlonk, Cierrah Dace, MD    Chief Complaint: Hypertension (refill zyrtec )   History of Present Illness:  This is a 71 y.o. female seen for six month f/u. Lexapro increased last visit, depressive sxs improved. No recent migraines. Had lumbar fusion, off Vicodin and baclofen. On vit D and B12 supps, Possible zoster in MUC in June rx'd with Valtrex, had old zoster imm. AR well controlled on Zyrtec/Flonase.  Review of Systems:  Review of Systems  Constitutional: Negative for chills and fever.  Respiratory: Negative for cough and shortness of breath.   Cardiovascular: Negative for chest pain and leg swelling.  Genitourinary: Negative for difficulty urinating.  Neurological: Negative for syncope and light-headedness.    Patient Active Problem List   Diagnosis Date Noted  . Acquired genu varum 11/30/2016  . Depression 08/25/2016  . Vitamin D deficiency 07/20/2016  . B12 deficiency 07/20/2016  . Gait instability 07/19/2016  . Hypertension 07/19/2016  . Hyperlipidemia 07/19/2016  . Osteopenia of spine 07/19/2016  . History of lumbar fusion 07/19/2016  . Hx of fusion of cervical spine 07/19/2016  . Migraine headache 07/19/2016  . Anxiety disorder 07/19/2016  . Allergic rhinitis 07/19/2016  . Constipation 07/19/2016  . Lung nodules 07/19/2016  . History of positive PPD 07/19/2016  . Patent pressure equalization (PE) tube on right side 07/19/2016  . Chronic venous insufficiency 05/17/2016  . Degenerative spondylolisthesis 03/21/2016  . De Quervain's disease (radial styloid tenosynovitis) 03/07/2015  . Ganglion cyst of dorsum of right wrist 03/07/2015  . Status post left partial knee replacement 04/07/2014  . Fibromyalgia 02/26/2013  . OA (osteoarthritis) 11/21/2012    Prior to Admission medications   Medication Sig Start Date End Date Taking? Authorizing Provider  cetirizine (ZYRTEC) 10 MG tablet  Take 1 tablet (10 mg total) by mouth daily as needed for allergies. 03/01/17  Yes Deijah Spikes, Chrissie NoaWilliam, MD  chlorthalidone (HYGROTON) 25 MG tablet Take 1 tablet (25 mg total) by mouth daily before breakfast. 09/25/16  Yes Hildagard Sobecki, Chrissie NoaWilliam, MD  Cholecalciferol (VITAMIN D) 2000 units CAPS Take 1 capsule (2,000 Units total) by mouth daily. 07/20/16  Yes Teirra Carapia, Chrissie NoaWilliam, MD  clonazePAM (KLONOPIN) 0.5 MG tablet Take 0.5 mg by mouth daily as needed for anxiety.   Yes [provider]  diclofenac sodium (VOLTAREN) 1 % GEL Apply 1 application topically 4 (four) times daily. Dr Kathi LudwigSyed   Yes [provider]  fluticasone Aleda Grana(FLONASE) 50 MCG/ACT nasal spray Place 2 sprays into both nostrils daily.   Yes [provider]  rizatriptan (MAXALT) 10 MG tablet Take 10 mg by mouth as needed. May repeat in 2 hours if needed   Yes [provider]  SUMAtriptan (IMITREX) 100 MG tablet Take 1 tablet (100 mg total) by mouth daily as needed for migraine. May repeat in 2 hours if headache persists or recurs. 11/23/16  Yes Blia Totman, Chrissie NoaWilliam, MD  vitamin B-12 (CYANOCOBALAMIN) 1000 MCG tablet Take 1 tablet (1,000 mcg total) by mouth daily. 07/20/16  Yes Keagen Heinlen, Chrissie NoaWilliam, MD  Zoster Vaccine Adjuvanted [facility](SHINGRIX) injection Inject 0.5 mLs into the muscle once for 1 dose. 03/01/17 03/01/17  Schuyler AmorPlonk, Arneda Sappington, MD    Allergies  Allergen Reactions  . Gentamicin Rash, Itching and Other (See Comments)  . Hydralazine Nausea And Vomiting, Nausea Only and Other (See Comments)  . Ibandronic Acid Other (See Comments)    cracked teeth and joint  pain cracked teeth and joint pain  . Penicillins Hives and Other (See Comments)    Has patient had a PCN reaction causing immediate rash, facial/tongue/throat swelling, SOB or lightheadedness with hypotension: No Has patient had a PCN reaction causing severe rash involving mucus membranes or skin necrosis: Yes Has patient had a PCN reaction that required hospitalization No Has patient had a  PCN reaction occurring within the last 10 years: No If all of the above answers are "NO", then may proceed with Cephalosporin use.     Past Surgical History:  Procedure Laterality Date  . ABDOMINAL HYSTERECTOMY    . BACK SURGERY     x3 previous cervical fusion & then removed hardware   . BREAST SURGERY Right    benign-   . HAND TENDON SURGERY Right 2016  . JOINT REPLACEMENT Right    also had a partial on the L   . LUMBAR FUSION    . MYRINGOTOMY WITH TUBE PLACEMENT Right   . TONSILLECTOMY    . TUBAL LIGATION     had a 2nd. surgery for adhesions    Social History   Tobacco Use  . Smoking status: Former Smoker    Types: Cigarettes    Last attempt to quit: 03/15/1976    Years since quitting: 40.9  . Smokeless tobacco: Never Used  Substance Use Topics  . Alcohol use: No  . Drug use: Yes    Frequency: 1.0 times per week    Types: Marijuana    Comment: occasionally     Family History  Problem Relation Age of Onset  . Aneurysm Mother   . Lung cancer Brother     Medication list has been reviewed and updated.  Physical Examination: BP 132/82   Pulse 98   Resp 16   Ht 5\' 3"  (1.6 m)   Wt 169 lb (76.7 kg)   SpO2 98%   BMI 29.94 kg/m   Physical Exam  Constitutional: She appears well-developed and well-nourished.  Cardiovascular: Normal rate, regular rhythm and normal heart sounds.  Pulmonary/Chest: Effort normal and breath sounds normal.  Musculoskeletal: She exhibits no edema.  Neurological: She is alert.  Skin: Skin is warm and dry.  Psychiatric: She has a normal mood and affect. Her behavior is normal.  Nursing note and vitals reviewed.   Assessment and Plan:  1. Essential hypertension Well controlled on chlorthialidone  2. Seasonal allergic rhinitis, unspecified trigger Well controlled on Zyrtec/Flonase, refill Zyrtec  3. Depression, unspecified depression type Improved on increased Lexapro  4. Migraine with aura and without status migrainosus, not  intractable Well controlled on prn Imitrex  5. B12 deficiency On supplement - B12  6. Vitamin D deficiency On supplement - Vitamin D (25 hydroxy)  7. Need for zoster vaccination - Zoster Vaccine Adjuvanted [facility](SHINGRIX) injection; Inject 0.5 mLs into the muscle once for 1 dose.  Dispense: 0.5 mL; Refill: 1  Return in about 6 months (around 08/30/2017).  Dionne AnoWilliam M. Kingsley SpittlePlonk, Jr. MD [facility]  03/01/2017

## 2017-03-15 ENCOUNTER — Telehealth: Payer: Self-pay

## 2017-03-15 NOTE — Telephone Encounter (Signed)
Inform I do not prescribe Klonopin due to confusion and fall risks. We can discuss next visit or sooner if needed. Also looks like she did not get blood work ordered last visit, can we reschedule?

## 2017-03-15 NOTE — Telephone Encounter (Addendum)
Wants refill on Clonazepam Advised she will have to pic up if decided on and that we usually do not write this. Did not see this mentioned in note and no results for labs ordered.

## 2017-03-16 NOTE — Telephone Encounter (Signed)
Thanks

## 2017-03-16 NOTE — Telephone Encounter (Signed)
She will go to lab today and also now aware of the meds. She will discuss later at follow up.

## 2017-03-21 ENCOUNTER — Telehealth: Payer: Self-pay

## 2017-03-21 NOTE — Telephone Encounter (Signed)
Left VM explaining AGAIN that we do have a lab down stairs not associated with [facility] since she did have bill there. I also went into the reasons we need labs done in providing the best quality care for her and we use lab results to determine if meds can be discontinued or not as well as diagnostic measures used in labs. I asked that she call us to let us know if she really is going or not.

## 2017-03-21 NOTE — Telephone Encounter (Signed)
Patient called to say she will go to lab 03/26/2017

## 2017-04-26 ENCOUNTER — Other Ambulatory Visit: Payer: Self-pay

## 2017-04-26 ENCOUNTER — Other Ambulatory Visit
Admission: RE | Admit: 2017-04-26 | Discharge: 2017-04-26 | Disposition: A | Discharge status: Home / Self Care | Payer: Medicare Other | Source: Ambulatory Visit | Attending: Family Medicine | Admitting: Family Medicine

## 2017-04-26 DIAGNOSIS — E538 Deficiency of other specified B group vitamins: Secondary | ICD-10-CM

## 2017-04-26 DIAGNOSIS — E559 Vitamin D deficiency, unspecified: Secondary | ICD-10-CM | POA: Diagnosis present

## 2017-04-27 LAB — VITAMIN D 25 HYDROXY (VIT D DEFICIENCY, FRACTURES): Vit D, 25-Hydroxy: 56.6 ng/mL (ref 30.0–100.0)

## 2017-04-27 LAB — VITAMIN B12: Vitamin B-12: 656 pg/mL (ref 180–914)

## 2017-05-11 ENCOUNTER — Ambulatory Visit
Admission: EM | Admit: 2017-05-11 | Discharge: 2017-05-11 | Disposition: A | Discharge status: Home / Self Care | Payer: Medicare Other | Attending: Family Medicine | Admitting: Family Medicine

## 2017-05-11 ENCOUNTER — Encounter: Payer: Self-pay | Admitting: Emergency Medicine

## 2017-05-11 ENCOUNTER — Other Ambulatory Visit: Payer: Self-pay

## 2017-05-11 DIAGNOSIS — S61412A Laceration without foreign body of left hand, initial encounter: Secondary | ICD-10-CM | POA: Diagnosis not present

## 2017-05-11 DIAGNOSIS — W5501XA Bitten by cat, initial encounter: Secondary | ICD-10-CM | POA: Diagnosis not present

## 2017-05-11 DIAGNOSIS — W5503XA Scratched by cat, initial encounter: Secondary | ICD-10-CM

## 2017-05-11 DIAGNOSIS — Z23 Encounter for immunization: Secondary | ICD-10-CM | POA: Diagnosis not present

## 2017-05-11 DIAGNOSIS — S61552A Open bite of left wrist, initial encounter: Secondary | ICD-10-CM | POA: Diagnosis not present

## 2017-05-11 MED ORDER — TETANUS-DIPHTH-ACELL PERTUSSIS 5-2.5-18.5 LF-MCG/0.5 IM SUSP
0.5000 mL | Freq: Once | INTRAMUSCULAR | Status: AC
  Administered 2017-05-11: 0.5 mL via INTRAMUSCULAR

## 2017-05-11 MED ORDER — DOXYCYCLINE HYCLATE 100 MG PO CAPS: 100.0000 mg | ORAL_CAPSULE | Freq: Two times a day (BID) | ORAL | 0 refills | Status: AC

## 2017-05-11 MED ORDER — METRONIDAZOLE 500 MG PO TABS: 500.0000 mg | ORAL_TABLET | Freq: Three times a day (TID) | ORAL | 0 refills | Status: AC

## 2017-05-11 NOTE — ED Triage Notes (Signed)
Patient in today c/o cat bite left hand. This is the patient's cat and patient states rabies vaccine is UTD. Last tetanus >5 years ago.

## 2017-05-11 NOTE — Discharge Instructions (Signed)
Routine care.  Watch out for infection.  Antibiotics as prescribed.  Take care  Dr. Adriana Simasook

## 2017-05-11 NOTE — ED Provider Notes (Signed)
[facility]    CSN: [id number] Arrival date & time: 05/11/17  [id number]  History   Chief Complaint Chief Complaint  Patient presents with  . Animal Bite    HPI  72 year old female presents with a cat bite.  Patient states that she was breaking up a fight between her dog and her cat this morning.  She states that she was bitten and scratched by her cat.  She suffered a laceration of her left wrist.  She states that her animals are up-to-date on the vaccines.  She is unsure of her last tetanus.  No bleeding at this time.  Reports that her pain is currently 3/10 in severity.  No known exacerbating relieving factors.  No other associated symptoms.  No other complaints at this time.  Past Medical History:  Diagnosis Date  . Anxiety   . Arthritis   . Depression   . Headache    migraine - last one 2016  . Hypertension    reason that she was given Hygroton & she remarks that her BP has been better she has been taking it.   Marland Kitchen. Positive skin test for tuberculosis    told that she is a carrier,     Patient Active Problem List   Diagnosis Date Noted  . Acquired genu varum 11/30/2016  . Depression 08/25/2016  . Vitamin D deficiency 07/20/2016  . B12 deficiency 07/20/2016  . Gait instability 07/19/2016  . Hypertension 07/19/2016  . Hyperlipidemia 07/19/2016  . Osteopenia of spine 07/19/2016  . History of lumbar fusion 07/19/2016  . Hx of fusion of cervical spine 07/19/2016  . Migraine headache 07/19/2016  . Anxiety disorder 07/19/2016  . Allergic rhinitis 07/19/2016  . Constipation 07/19/2016  . Lung nodules 07/19/2016  . History of positive PPD 07/19/2016  . Patent pressure equalization (PE) tube on right side 07/19/2016  . Chronic venous insufficiency 05/17/2016  . Degenerative spondylolisthesis 03/21/2016  . De Quervain's disease (radial styloid tenosynovitis) 03/07/2015  . Ganglion cyst of dorsum of right wrist 03/07/2015  . Status post left partial knee replacement  04/07/2014  . Fibromyalgia 02/26/2013  . OA (osteoarthritis) 11/21/2012    Past Surgical History:  Procedure Laterality Date  . ABDOMINAL HYSTERECTOMY    . BACK SURGERY     x3 previous cervical fusion & then removed hardware   . BREAST SURGERY Right    benign-   . HAND TENDON SURGERY Right 2016  . JOINT REPLACEMENT Right    also had a partial on the L   . LUMBAR FUSION    . MYRINGOTOMY WITH TUBE PLACEMENT Right   . TONSILLECTOMY    . TUBAL LIGATION     had a 2nd. surgery for adhesions    OB History    No data available     Home Medications    Prior to Admission medications   Medication Sig Start Date End Date Taking? Authorizing Provider  cetirizine (ZYRTEC) 10 MG tablet Take 1 tablet (10 mg total) by mouth daily as needed for allergies. 03/01/17  Yes Plonk, Chrissie NoaWilliam, MD  chlorthalidone (HYGROTON) 25 MG tablet Take 1 tablet (25 mg total) by mouth daily before breakfast. 09/25/16  Yes Plonk, Chrissie NoaWilliam, MD  Cholecalciferol (VITAMIN D) 2000 units CAPS Take 1 capsule (2,000 Units total) by mouth daily. 07/20/16  Yes Plonk, Chrissie NoaWilliam, MD  clonazePAM (KLONOPIN) 0.5 MG tablet Take 0.5 mg by mouth daily as needed for anxiety.   Yes [provider]  fluticasone (FLONASE) 50 MCG/ACT nasal  spray Place 2 sprays into both nostrils daily.   Yes [provider]  vitamin B-12 (CYANOCOBALAMIN) 1000 MCG tablet Take 1 tablet (1,000 mcg total) by mouth daily. 07/20/16  Yes Plonk, Chrissie Noa, MD  diclofenac sodium (VOLTAREN) 1 % GEL Apply 1 application topically 4 (four) times daily. Dr Kathi Ludwig    [provider]  doxycycline (VIBRAMYCIN) 100 MG capsule Take 1 capsule (100 mg total) by mouth 2 (two) times daily for 3 days. 05/11/17 05/14/17  Tommie Sams, DO  metroNIDAZOLE (FLAGYL) 500 MG tablet Take 1 tablet (500 mg total) by mouth 3 (three) times daily for 3 days. 05/11/17 05/14/17  Tommie Sams, DO  SUMAtriptan (IMITREX) 100 MG tablet Take 1 tablet (100 mg total) by mouth daily as needed  for migraine. May repeat in 2 hours if headache persists or recurs. 11/23/16   Schuyler Amor, MD    Family History Family History  Problem Relation Age of Onset  . Aneurysm Mother   . Lung cancer Brother     Social History Social History   Tobacco Use  . Smoking status: Former Smoker    Types: Cigarettes    Last attempt to quit: 03/15/1976    Years since quitting: 41.1  . Smokeless tobacco: Never Used  Substance Use Topics  . Alcohol use: No  . Drug use: Yes    Frequency: 1.0 times per week    Types: Marijuana    Comment: occasionally      Allergies   Gentamicin; Hydralazine; Ibandronic acid; and Penicillins   Review of Systems Review of Systems  Constitutional: Negative.   Skin: Positive for wound.   Physical Exam Triage Vital Signs ED Triage Vitals  Enc Vitals Group     BP 05/11/17 0917 113/65     Pulse Rate 05/11/17 0917 79     Resp 05/11/17 0917 16     Temp 05/11/17 0917 97.7 F (36.5 C)     Temp Source 05/11/17 0917 Oral     SpO2 05/11/17 0917 99 %     Weight 05/11/17 0918 168 lb (76.2 kg)     Height 05/11/17 0918 5\' 3"  (1.6 m)     Head Circumference --      Peak Flow --      Pain Score 05/11/17 0917 3     Pain Loc --      Pain Edu? --      Excl. in GC? --    Updated Vital Signs BP 113/65 (BP Location: Right Arm)   Pulse 79   Temp 97.7 F (36.5 C) (Oral)   Resp 16   Ht 5\' 3"  (1.6 m)   Wt 168 lb (76.2 kg)   SpO2 99%   BMI 29.76 kg/m   Physical Exam  Constitutional: She is oriented to person, place, and time. She appears well-developed. No distress.  Cardiovascular: Normal rate and regular rhythm.  No murmur heard. Pulmonary/Chest: Effort normal and breath sounds normal. She has no wheezes. She has no rales.  Neurological: She is alert and oriented to person, place, and time.  Skin:  Left wrist (palmar/ventral aspect) -1.5 cm linear laceration noted.  No surrounding erythema.  No bleeding.  Psychiatric: She has a normal mood and affect.  Her behavior is normal.  Nursing note and vitals reviewed.  UC Treatments / Results  Labs (all labs ordered are listed, but only abnormal results are displayed) Labs Reviewed - No data to display  EKG  EKG Interpretation None  Radiology No results found.  Procedures Procedures (including critical care time)  Medications Ordered in UC Medications  Tdap (BOOSTRIX) injection 0.5 mL (0.5 mLs Intramuscular Given 05/11/17 0926)     Initial Impression / Assessment and Plan / UC Course  I have reviewed the triage vital signs and the nursing notes.  Pertinent labs & imaging results that were available during my care of the patient were reviewed by me and considered in my medical decision making (see chart for details).     72 year old female presents with a wound secondary to cat bite/scratch.  Wound not closed as this is not recommended due to the nature of her wound.  Prophylactic antibiotics - doxycycline and Flagyl given penicillin allergy.  Wound to heal by secondary intention.  Supportive care.  Final Clinical Impressions(s) / UC Diagnoses   Final diagnoses:  Cat bite, initial encounter    ED Discharge Orders        Ordered    doxycycline (VIBRAMYCIN) 100 MG capsule  2 times daily     05/11/17 1012    metroNIDAZOLE (FLAGYL) 500 MG tablet  3 times daily     05/11/17 1012     Controlled Substance Prescriptions [place] Controlled Substance Registry consulted? Not Applicable   Tommie Sams, DO 05/11/17 1039

## 2017-05-11 NOTE — ED Triage Notes (Signed)
Animal Control notified

## 2017-05-12 ENCOUNTER — Emergency Department: Payer: Medicare Other

## 2017-05-12 ENCOUNTER — Encounter: Payer: Self-pay | Admitting: Physician Assistant

## 2017-05-12 ENCOUNTER — Emergency Department
Admission: EM | Admit: 2017-05-12 | Discharge: 2017-05-12 | Disposition: A | Discharge status: Home / Self Care | Payer: Medicare Other | Attending: Emergency Medicine | Admitting: Emergency Medicine

## 2017-05-12 ENCOUNTER — Other Ambulatory Visit: Payer: Self-pay

## 2017-05-12 DIAGNOSIS — T148XXA Other injury of unspecified body region, initial encounter: Secondary | ICD-10-CM

## 2017-05-12 DIAGNOSIS — S61432D Puncture wound without foreign body of left hand, subsequent encounter: Secondary | ICD-10-CM | POA: Insufficient documentation

## 2017-05-12 DIAGNOSIS — I10 Essential (primary) hypertension: Secondary | ICD-10-CM | POA: Diagnosis not present

## 2017-05-12 DIAGNOSIS — W5501XD Bitten by cat, subsequent encounter: Secondary | ICD-10-CM | POA: Insufficient documentation

## 2017-05-12 DIAGNOSIS — Z79899 Other long term (current) drug therapy: Secondary | ICD-10-CM | POA: Insufficient documentation

## 2017-05-12 DIAGNOSIS — Z9660 Presence of unspecified orthopedic joint implant: Secondary | ICD-10-CM | POA: Insufficient documentation

## 2017-05-12 DIAGNOSIS — Z87891 Personal history of nicotine dependence: Secondary | ICD-10-CM | POA: Insufficient documentation

## 2017-05-12 LAB — CBC WITH DIFFERENTIAL/PLATELET
Basophils Absolute: 0 10*3/uL (ref 0–0.1)
Basophils Relative: 1 %
EOS ABS: 0.1 10*3/uL (ref 0–0.7)
Eosinophils Relative: 1 %
HEMATOCRIT: 39.8 % (ref 35.0–47.0)
Hemoglobin: 14 g/dL (ref 12.0–16.0)
LYMPHS ABS: 1.7 10*3/uL (ref 1.0–3.6)
LYMPHS PCT: 24 %
MCH: 30.9 pg (ref 26.0–34.0)
MCHC: 35.2 g/dL (ref 32.0–36.0)
MCV: 87.9 fL (ref 80.0–100.0)
Monocytes Absolute: 0.7 10*3/uL (ref 0.2–0.9)
Monocytes Relative: 10 %
NEUTROS ABS: 4.6 10*3/uL (ref 1.4–6.5)
NEUTROS PCT: 64 %
Platelets: 214 10*3/uL (ref 150–440)
RBC: 4.52 MIL/uL (ref 3.80–5.20)
RDW: 14.5 % (ref 11.5–14.5)
WBC: 7.1 10*3/uL (ref 3.6–11.0)

## 2017-05-12 MED ORDER — CLINDAMYCIN PHOSPHATE 600 MG/50ML IV SOLN
600.0000 mg | Freq: Once | INTRAVENOUS | Status: AC
  Administered 2017-05-12: 600 mg via INTRAVENOUS
  Filled 2017-05-12: qty 50

## 2017-05-12 MED ORDER — SULFAMETHOXAZOLE-TRIMETHOPRIM 800-160 MG PO TABS: 1.0000 | ORAL_TABLET | Freq: Two times a day (BID) | ORAL | 0 refills | Status: DC

## 2017-05-12 NOTE — Discharge Instructions (Signed)
Discontinue the doxycycline.  You do not need this medication.  You are being given a new medication called Septra.  This is twice a day.  Continue to take the Flagyl.  Return to the emergency department if the area is about the same or not better tomorrow.  If it is improving you may follow-up with your regular doctor in 3-4 days for recheck.

## 2017-05-12 NOTE — ED Triage Notes (Signed)
Patient reports she was bitten by her cat on Thursday and that area is looking worse.  [facility] and is taking medications as prescribed.

## 2017-05-12 NOTE — ED Notes (Signed)
Pt bitten on left hand by her cat yesterday; seen at urgent care; now with redness and swelling to area;

## 2017-05-12 NOTE — ED Notes (Signed)
Pt c/o of cat bite on her left hand that has had increase swelling and redness since yesterday after being seen at Urgent Care.

## 2017-05-12 NOTE — ED Provider Notes (Signed)
[facility] Emergency Department Provider Note  ____________________________________________   First MD Initiated Contact with Patient 05/12/17 1938     (approximate)  I have reviewed the triage vital signs and the nursing notes.   HISTORY  Chief Complaint Animal Bite    HPI Carol [name] is a 72 y.o. female complains of increased redness and swelling at the site of her Bite.  She was bit by her cat on Thursday.  She was seen at [facility] in urgent care.  They prescribed her doxycycline and Flagyl.  She denies any fever or chills.  Past Medical History:  Diagnosis Date  . Anxiety   . Arthritis   . Depression   . Headache    migraine - last one 2016  . Hypertension    reason that she was given Hygroton & she remarks that her BP has been better she has been taking it.   Marland Kitchen. Positive skin test for tuberculosis    told that she is a carrier,     Patient Active Problem List   Diagnosis Date Noted  . Acquired genu varum 11/30/2016  . Depression 08/25/2016  . Vitamin D deficiency 07/20/2016  . B12 deficiency 07/20/2016  . Gait instability 07/19/2016  . Hypertension 07/19/2016  . Hyperlipidemia 07/19/2016  . Osteopenia of spine 07/19/2016  . History of lumbar fusion 07/19/2016  . Hx of fusion of cervical spine 07/19/2016  . Migraine headache 07/19/2016  . Anxiety disorder 07/19/2016  . Allergic rhinitis 07/19/2016  . Constipation 07/19/2016  . Lung nodules 07/19/2016  . History of positive PPD 07/19/2016  . Patent pressure equalization (PE) tube on right side 07/19/2016  . Chronic venous insufficiency 05/17/2016  . Degenerative spondylolisthesis 03/21/2016  . De Quervain's disease (radial styloid tenosynovitis) 03/07/2015  . Ganglion cyst of dorsum of right wrist 03/07/2015  . Status post left partial knee replacement 04/07/2014  . Fibromyalgia 02/26/2013  . OA (osteoarthritis) 11/21/2012    Past Surgical History:  Procedure Laterality Date  .  ABDOMINAL HYSTERECTOMY    . BACK SURGERY     x3 previous cervical fusion & then removed hardware   . BREAST SURGERY Right    benign-   . HAND TENDON SURGERY Right 2016  . JOINT REPLACEMENT Right    also had a partial on the L   . LUMBAR FUSION    . MYRINGOTOMY WITH TUBE PLACEMENT Right   . TONSILLECTOMY    . TUBAL LIGATION     had a 2nd. surgery for adhesions    Prior to Admission medications   Medication Sig Start Date End Date Taking? Authorizing Provider  cetirizine (ZYRTEC) 10 MG tablet Take 1 tablet (10 mg total) by mouth daily as needed for allergies. 03/01/17   Plonk, Chrissie NoaWilliam, MD  chlorthalidone (HYGROTON) 25 MG tablet Take 1 tablet (25 mg total) by mouth daily before breakfast. 09/25/16   Plonk, Chrissie NoaWilliam, MD  Cholecalciferol (VITAMIN D) 2000 units CAPS Take 1 capsule (2,000 Units total) by mouth daily. 07/20/16   Plonk, Chrissie NoaWilliam, MD  clonazePAM (KLONOPIN) 0.5 MG tablet Take 0.5 mg by mouth daily as needed for anxiety.    [provider]  diclofenac sodium (VOLTAREN) 1 % GEL Apply 1 application topically 4 (four) times daily. Dr Kathi LudwigSyed    [provider]  doxycycline (VIBRAMYCIN) 100 MG capsule Take 1 capsule (100 mg total) by mouth 2 (two) times daily for 3 days. 05/11/17 05/14/17  Tommie Samsook, Jayce G, DO  fluticasone (FLONASE) 50 MCG/ACT  nasal spray Place 2 sprays into both nostrils daily.    [provider]  metroNIDAZOLE (FLAGYL) 500 MG tablet Take 1 tablet (500 mg total) by mouth 3 (three) times daily for 3 days. 05/11/17 05/14/17  Tommie Sams, DO  sulfamethoxazole-trimethoprim (BACTRIM DS,SEPTRA DS) 800-160 MG tablet Take 1 tablet by mouth 2 (two) times daily. 05/12/17   Sherrie Mustache Roselyn Bering, PA-C  SUMAtriptan (IMITREX) 100 MG tablet Take 1 tablet (100 mg total) by mouth daily as needed for migraine. May repeat in 2 hours if headache persists or recurs. 11/23/16   Plonk, Chrissie Noa, MD  vitamin B-12 (CYANOCOBALAMIN) 1000 MCG tablet Take 1 tablet (1,000 mcg total) by mouth  daily. 07/20/16   Plonk, Chrissie Noa, MD    Allergies Gentamicin; Hydralazine; Ibandronic acid; and Penicillins  Family History  Problem Relation Age of Onset  . Aneurysm Mother   . Lung cancer Brother     Social History Social History   Tobacco Use  . Smoking status: Former Smoker    Types: Cigarettes    Last attempt to quit: 03/15/1976    Years since quitting: 41.1  . Smokeless tobacco: Never Used  Substance Use Topics  . Alcohol use: No  . Drug use: Yes    Frequency: 1.0 times per week    Types: Marijuana    Comment: occasionally     Review of Systems  Constitutional: No fever/chills Eyes: No visual changes. ENT: No sore throat. Respiratory: Denies cough Genitourinary: Negative for dysuria. Musculoskeletal: Negative for back pain. Skin: Negative for rash.  Positive for increased redness and swelling had a cat bite area    ____________________________________________   PHYSICAL EXAM:  VITAL SIGNS: ED Triage Vitals [05/12/17 1929]  Enc Vitals Group     BP 124/75     Pulse Rate (!) 102     Resp 18     Temp 98.8 F (37.1 C)     Temp Source Oral     SpO2 98 %     Weight      Height      Head Circumference      Peak Flow      Pain Score      Pain Loc      Pain Edu?      Excl. in GC?     Constitutional: Alert and oriented. Well appearing and in no acute distress. Eyes: Conjunctivae are normal.  Head: Atraumatic. Nose: No congestion/rhinnorhea. Mouth/Throat: Mucous membranes are moist.   Cardiovascular: Normal rate, regular rhythm. Respiratory: Normal respiratory effort.  No retractions GU: deferred Musculoskeletal: FROM all extremities, warm and well perfused Neurologic:  Normal speech and language.  Skin:  Skin is warm, dry , the area surrounding the puncture wounds and laceration from her cat have become red and swollen, tender to palpation.  Neurovascularly she is intact.  There is no obvious drainage noted  psychiatric: Mood and affect are  normal. Speech and behavior are normal.  ____________________________________________   LABS (all labs ordered are listed, but only abnormal results are displayed)  Labs Reviewed  CBC WITH DIFFERENTIAL/PLATELET   ____________________________________________   ____________________________________________  RADIOLOGY    ____________________________________________   PROCEDURES  Procedure(s) performed: No  Procedures    ____________________________________________   INITIAL IMPRESSION / ASSESSMENT AND PLAN / ED COURSE  Pertinent labs & imaging results that were available during my care of the patient were reviewed by me and considered in my medical decision making (see chart for details).  Patient 72 year old female presenting  to the emergency department because of concerns of her Bite becoming more infected.  She was seen at med in urgent care on Thursday 2 days ago and given doxycycline and Flagyl.  She is allergic to the penicillins  On physical exam the area surrounding the laceration and puncture wounds has become red swollen and tender.  Patient is afebrile  Clindamycin 600 mg IV and CBC were ordered.  The area was marked with a marker.    ----------------------------------------- 9:11 PM on 05/12/2017 -----------------------------------------  The CBC is normal.  On reassessment of the patient after the clindamycin the redness is already decreased some in the left hand.  She was told to discontinue the doxycycline.  She was given a prescription for Septra DS and she is to continue her Flagyl.  She is to have a recheck tomorrow if the area is not improving.  Instructed her to return as soon as possible if she develops a fever.  Explained to her that many patients end up being admitted for IV antibiotics due to cats bites being so dirty with bacteria.  The patient states she understands.  She will return to the emergency department for recheck tomorrow.  If she is  extremely better she can wait and have her recheck with her regular doctor.  She states she understands.  She was discharged in stable condition  As part of my medical decision making, I reviewed the following data within the electronic MEDICAL RECORD NUMBER Nursing notes reviewed and incorporated, Labs reviewed CBC is normal, Old chart reviewed, Radiograph reviewed x-ray of the hand and wrist is negative for foreign body or fracture, Notes from prior ED visits and [place] Controlled Substance Database  ____________________________________________   FINAL CLINICAL IMPRESSION(S) / ED DIAGNOSES  Final diagnoses:  Cat bite, subsequent encounter      NEW MEDICATIONS STARTED DURING THIS VISIT:  Discharge Medication List as of 05/12/2017  8:31 PM    START taking these medications   Details  sulfamethoxazole-trimethoprim (BACTRIM DS,SEPTRA DS) 800-160 MG tablet Take 1 tablet by mouth 2 (two) times daily., Starting Sat 05/12/2017, Print         Note:  This document was prepared using Dragon voice recognition software and may include unintentional dictation errors.    Faythe Ghee, PA-C 05/12/17 2113    Dionne Bucy, MD 05/12/17 [phone]

## 2017-05-21 ENCOUNTER — Inpatient Hospital Stay: Payer: Medicare Other | Admitting: Family Medicine

## 2017-05-25 ENCOUNTER — Ambulatory Visit (INDEPENDENT_AMBULATORY_CARE_PROVIDER_SITE_OTHER): Payer: Medicare Other | Admitting: Family Medicine

## 2017-05-25 ENCOUNTER — Encounter: Payer: Self-pay | Admitting: Family Medicine

## 2017-05-25 VITALS — BP 110/68 | HR 75 | Resp 16 | Ht 63.0 in | Wt 175.0 lb

## 2017-05-25 DIAGNOSIS — E538 Deficiency of other specified B group vitamins: Secondary | ICD-10-CM

## 2017-05-25 DIAGNOSIS — F411 Generalized anxiety disorder: Secondary | ICD-10-CM | POA: Diagnosis not present

## 2017-05-25 DIAGNOSIS — J302 Other seasonal allergic rhinitis: Secondary | ICD-10-CM | POA: Diagnosis not present

## 2017-05-25 DIAGNOSIS — E669 Obesity, unspecified: Secondary | ICD-10-CM

## 2017-05-25 DIAGNOSIS — I10 Essential (primary) hypertension: Secondary | ICD-10-CM | POA: Diagnosis not present

## 2017-05-25 DIAGNOSIS — G43109 Migraine with aura, not intractable, without status migrainosus: Secondary | ICD-10-CM

## 2017-05-25 DIAGNOSIS — E559 Vitamin D deficiency, unspecified: Secondary | ICD-10-CM

## 2017-05-25 DIAGNOSIS — W5501XD Bitten by cat, subsequent encounter: Secondary | ICD-10-CM

## 2017-05-25 NOTE — Patient Instructions (Signed)
Decrease OTC vitamin D3 to 1000 units daily with next refill.

## 2017-05-25 NOTE — Progress Notes (Signed)
Date:  05/25/2017   Name:  Carol [name]   DOB:  [birth year]   MRN:  [id number]  PCP:  Schuyler Amor, MD    Chief Complaint: Animal Bite (Cat bite 05/12/2017 saw [facility]. Reported to Animal Control but it was her own cat. )   History of Present Illness:  This is a 71 y.o. female seen for three month f/u. Seen ED 05/11/17 for cat bite, given tetanus booster and placed on doxy/Flagyl, seen again next day, given IV clindamycin and doxy switched to Bactrim. Hand still sore and stiff but much improved. HTN on chlothalidone, AR marginally controlled on Zyrtec/Flonase, to see allergist soon. No recent migraines or anxiety attacks. On B12 and vit D supplements.  Review of Systems:  Review of Systems  Constitutional: Negative for chills and fever.  Respiratory: Negative for cough and shortness of breath.   Cardiovascular: Negative for chest pain and leg swelling.  Genitourinary: Negative for difficulty urinating.  Neurological: Negative for syncope and light-headedness.    Patient Active Problem List   Diagnosis Date Noted  . Obesity (BMI 30.0-34.9) 05/25/2017  . Acquired genu varum 11/30/2016  . Depression 08/25/2016  . Vitamin D deficiency 07/20/2016  . B12 deficiency 07/20/2016  . Gait instability 07/19/2016  . Hypertension 07/19/2016  . Hyperlipidemia 07/19/2016  . Osteopenia of spine 07/19/2016  . History of lumbar fusion 07/19/2016  . Hx of fusion of cervical spine 07/19/2016  . Migraine headache 07/19/2016  . Anxiety disorder 07/19/2016  . Allergic rhinitis 07/19/2016  . Constipation 07/19/2016  . Lung nodules 07/19/2016  . History of positive PPD 07/19/2016  . Patent pressure equalization (PE) tube on right side 07/19/2016  . Chronic venous insufficiency 05/17/2016  . Degenerative spondylolisthesis 03/21/2016  . De Quervain's disease (radial styloid tenosynovitis) 03/07/2015  . Ganglion cyst of dorsum of right wrist 03/07/2015  . Status post left partial knee replacement  04/07/2014  . Fibromyalgia 02/26/2013  . OA (osteoarthritis) 11/21/2012    Prior to Admission medications   Medication Sig Start Date End Date Taking? Authorizing Provider  cetirizine (ZYRTEC) 10 MG tablet Take 1 tablet (10 mg total) by mouth daily as needed for allergies. 03/01/17  Yes Jarreau Callanan, Chrissie Noa, MD  chlorthalidone (HYGROTON) 25 MG tablet Take 1 tablet (25 mg total) by mouth daily before breakfast. 09/25/16  Yes Irasema Chalk, Chrissie Noa, MD  cholecalciferol (VITAMIN D) 1000 units tablet Take 1,000 Units by mouth daily.   Yes [provider]  clonazePAM (KLONOPIN) 0.5 MG tablet Take 0.5 mg by mouth daily as needed for anxiety.   Yes [provider]  diclofenac sodium (VOLTAREN) 1 % GEL Apply 1 application topically 4 (four) times daily. Dr Kathi Ludwig   Yes [provider]  fluticasone Aleda Grana) 50 MCG/ACT nasal spray Place 2 sprays into both nostrils daily.   Yes [provider]  SUMAtriptan (IMITREX) 100 MG tablet Take 1 tablet (100 mg total) by mouth daily as needed for migraine. May repeat in 2 hours if headache persists or recurs. 11/23/16  Yes Kveon Casanas, Chrissie Noa, MD  vitamin B-12 (CYANOCOBALAMIN) 1000 MCG tablet Take 1 tablet (1,000 mcg total) by mouth daily. 07/20/16  Yes [place] Moss, Chrissie Noa, MD    Allergies  Allergen Reactions  . Gentamicin Rash, Itching and Other (See Comments)  . Hydralazine Nausea And Vomiting, Nausea Only and Other (See Comments)  . Ibandronic Acid Other (See Comments)    cracked teeth and joint pain cracked teeth and joint pain  . Penicillins Hives and Other (See Comments)  Has patient had a PCN reaction causing immediate rash, facial/tongue/throat swelling, SOB or lightheadedness with hypotension: No Has patient had a PCN reaction causing severe rash involving mucus membranes or skin necrosis: Yes Has patient had a PCN reaction that required hospitalization No Has patient had a PCN reaction occurring within the last 10 years: No If all of the  above answers are "NO", then may proceed with Cephalosporin use.     Past Surgical History:  Procedure Laterality Date  . ABDOMINAL HYSTERECTOMY    . BACK SURGERY     x3 previous cervical fusion & then removed hardware   . BREAST SURGERY Right    benign-   . HAND TENDON SURGERY Right 2016  . JOINT REPLACEMENT Right    also had a partial on the L   . LUMBAR FUSION    . MYRINGOTOMY WITH TUBE PLACEMENT Right   . TONSILLECTOMY    . TUBAL LIGATION     had a 2nd. surgery for adhesions    Social History   Tobacco Use  . Smoking status: Former Smoker    Types: Cigarettes    Last attempt to quit: 03/15/1976    Years since quitting: 41.2  . Smokeless tobacco: Never Used  Substance Use Topics  . Alcohol use: No  . Drug use: Yes    Frequency: 1.0 times per week    Types: Marijuana    Comment: occasionally     Family History  Problem Relation Age of Onset  . Aneurysm Mother   . Lung cancer Brother     Medication list has been reviewed and updated.  Physical Examination: BP 110/68   Pulse 75   Resp 16   Ht 5\' 3"  (1.6 m)   Wt 175 lb (79.4 kg)   SpO2 99%   BMI 31.00 kg/m   Physical Exam  Constitutional: She appears well-developed and well-nourished.  Cardiovascular: Normal rate, regular rhythm and normal heart sounds.  Pulmonary/Chest: Effort normal and breath sounds normal.  Musculoskeletal: She exhibits no edema.  Neurological: She is alert.  Skin: Skin is warm and dry.  Bites healing well, no sign edema/erythema noted  Psychiatric: She has a normal mood and affect. Her behavior is normal.  Nursing note and vitals reviewed.   Assessment and Plan:  1. Cat bite, subsequent encounter Healing well s/p abxs, call if sxs worsen'/persist  2. Essential hypertension Well controlled on chlorthalidone  3. Seasonal allergic rhinitis, unspecified trigger Marginal control on Zyrtec/Flonase, to see allergist soon  4. Migraine with aura and without status migrainosus,  not intractable Well controlled on prn Imitrex  5. Generalized anxiety disorder Well controlled on prn Klonopin  6. B12 deficiency Well controlled on supplement  7. Vitamin D deficiency Over-controlled, decrease OTC vit D3 to 1000 units daily  8. Obesity (BMI 30.0-34.9) Weight up 6#, exercise/weight loss discussed  Return in about 6 months (around 11/25/2017).  Dionne AnoWilliam M. Kingsley SpittlePlonk, Jr. MD [facility]  05/25/2017

## 2017-06-12 DIAGNOSIS — M79642 Pain in left hand: Secondary | ICD-10-CM | POA: Insufficient documentation

## 2017-09-03 ENCOUNTER — Ambulatory Visit: Payer: Medicare Other | Admitting: Family Medicine

## 2017-10-01 ENCOUNTER — Ambulatory Visit (INDEPENDENT_AMBULATORY_CARE_PROVIDER_SITE_OTHER): Payer: Medicare Other | Admitting: Vascular Surgery

## 2017-10-01 ENCOUNTER — Encounter (INDEPENDENT_AMBULATORY_CARE_PROVIDER_SITE_OTHER): Payer: Self-pay | Admitting: Vascular Surgery

## 2017-10-01 VITALS — BP 137/75 | HR 76 | Resp 17 | Ht 63.5 in | Wt 175.0 lb

## 2017-10-01 DIAGNOSIS — I872 Venous insufficiency (chronic) (peripheral): Secondary | ICD-10-CM

## 2017-10-01 DIAGNOSIS — M79605 Pain in left leg: Secondary | ICD-10-CM | POA: Diagnosis not present

## 2017-10-01 DIAGNOSIS — E785 Hyperlipidemia, unspecified: Secondary | ICD-10-CM | POA: Diagnosis not present

## 2017-10-01 DIAGNOSIS — I83813 Varicose veins of bilateral lower extremities with pain: Secondary | ICD-10-CM

## 2017-10-01 DIAGNOSIS — M159 Polyosteoarthritis, unspecified: Secondary | ICD-10-CM

## 2017-10-01 NOTE — Progress Notes (Signed)
MRN : [id number]  Carol [name] is a 72 y.o. ([birth year]) female who presents with chief complaint of  Chief Complaint  Patient presents with  . Follow-up    left leg swelling and pain  .  History of Present Illness: The patient returns for followup evaluation regarding persistent painful varicose veins. The patient continues to have pain in the lower extremities with dependency. The pain is lessened with elevation. Graduated compression stockings, Class I (20-30 mmHg), have been worn but the stockings do not eliminate the leg pain. Over-the-counter analgesics do not improve the symptoms. The degree of discomfort continues to interfere with daily activities. The patient notes the pain in the legs is causing problems with daily exercise, at the workplace and even with household activities and maintenance such as standing in the kitchen preparing meals and doing dishes.     Current Meds  Medication Sig  . azelastine (ASTELIN) 0.1 % nasal spray SPRAY ONCE IN EACH NOSTRIL DAILY  . cetirizine (ZYRTEC) 10 MG tablet Take 1 tablet (10 mg total) by mouth daily as needed for allergies.  . chlorthalidone (HYGROTON) 25 MG tablet Take 1 tablet (25 mg total) by mouth daily before breakfast.  . cholecalciferol (VITAMIN D) 1000 units tablet Take 1,000 Units by mouth daily.  . clindamycin (CLEOCIN) 150 MG capsule TAKE FOUR TABS PRIOR TO DENTAL APPOINMENT  . clonazePAM (KLONOPIN) 0.5 MG tablet Take 0.5 mg by mouth daily as needed for anxiety.  . diclofenac sodium (VOLTAREN) 1 % GEL Apply 1 application topically 4 (four) times daily. Dr Kathi Ludwig  . escitalopram (LEXAPRO) 20 MG tablet Take by mouth.  . fluticasone (FLONASE) 50 MCG/ACT nasal spray Place 2 sprays into both nostrils daily.  . Inulin (FIBER CHOICE PO) Take 3 tablets by mouth daily.  . rizatriptan (MAXALT) 10 MG tablet Take 1 tablet by mouth single dose as directed [for migraine, may repeat in 2 hours if still with migraine] as needed  .  SUMAtriptan (IMITREX) 100 MG tablet Take 1 tablet (100 mg total) by mouth daily as needed for migraine. May repeat in 2 hours if headache persists or recurs.    Past Medical History:  Diagnosis Date  . Anxiety   . Arthritis   . Depression   . Headache    migraine - last one 2016  . Hypertension    reason that she was given Hygroton & she remarks that her BP has been better she has been taking it.   Marland Kitchen Positive skin test for tuberculosis    told that she is a carrier,     Past Surgical History:  Procedure Laterality Date  . ABDOMINAL HYSTERECTOMY    . BACK SURGERY     x3 previous cervical fusion & then removed hardware   . BREAST SURGERY Right    benign-   . HAND TENDON SURGERY Right 2016  . JOINT REPLACEMENT Right    also had a partial on the L   . LUMBAR FUSION    . MYRINGOTOMY WITH TUBE PLACEMENT Right   . TONSILLECTOMY    . TUBAL LIGATION     had a 2nd. surgery for adhesions    Social History Social History   Tobacco Use  . Smoking status: Former Smoker    Types: Cigarettes    Last attempt to quit: 03/15/1976    Years since quitting: 41.5  . Smokeless tobacco: Never Used  Substance Use Topics  . Alcohol use: No  . Drug use: Yes  Frequency: 1.0 times per week    Types: Marijuana    Comment: occasionally     Family History Family History  Problem Relation Age of Onset  . Aneurysm Mother   . Lung cancer Brother     Allergies  Allergen Reactions  . Gentamicin Rash, Itching and Other (See Comments)  . Hydralazine Nausea And Vomiting, Nausea Only and Other (See Comments)  . Ibandronic Acid Other (See Comments)    cracked teeth and joint pain cracked teeth and joint pain  . Penicillins Hives and Other (See Comments)    Has patient had a PCN reaction causing immediate rash, facial/tongue/throat swelling, SOB or lightheadedness with hypotension: No Has patient had a PCN reaction causing severe rash involving mucus membranes or skin necrosis: Yes Has  patient had a PCN reaction that required hospitalization No Has patient had a PCN reaction occurring within the last 10 years: No If all of the above answers are "NO", then may proceed with Cephalosporin use.      REVIEW OF SYSTEMS (Negative unless checked)  Constitutional: [] Weight loss  [] Fever  [] Chills Cardiac: [] Chest pain   [] Chest pressure   [] Palpitations   [] Shortness of breath when laying flat   [] Shortness of breath with exertion. Vascular:  [] Pain in legs with walking   [x] Pain in legs with standing  [] History of DVT   [] Phlebitis   [x] Swelling in legs   [x] Varicose veins   [] Non-healing ulcers Pulmonary:   [] Uses home oxygen   [] Productive cough   [] Hemoptysis   [] Wheeze  [] COPD   [] Asthma Neurologic:  [] Dizziness   [] Seizures   [] History of stroke   [] History of TIA  [] Aphasia   [] Vissual changes   [] Weakness or numbness in arm   [] Weakness or numbness in leg Musculoskeletal:   [] Joint swelling   [] Joint pain   [] Low back pain Hematologic:  [] Easy bruising  [] Easy bleeding   [] Hypercoagulable state   [] Anemic Gastrointestinal:  [] Diarrhea   [] Vomiting  [] Gastroesophageal reflux/heartburn   [] Difficulty swallowing. Genitourinary:  [] Chronic kidney disease   [] Difficult urination  [] Frequent urination   [] Blood in urine Skin:  [] Rashes   [] Ulcers  Psychological:  [] History of anxiety   []  History of major depression.  Physical Examination  Vitals:   10/01/17 1402  BP: 137/75  Pulse: 76  Resp: 17  Weight: 175 lb (79.4 kg)  Height: 5' 3.5" (1.613 m)   Body mass index is 30.51 kg/m. Gen: WD/WN, NAD Head: [place]/AT, No temporalis wasting.  Ear/Nose/Throat: Hearing grossly intact, nares w/o erythema or drainage Eyes: PER, EOMI, sclera nonicteric.  Neck: Supple, no large masses.   Pulmonary:  Good air movement, no audible wheezing bilaterally, no use of accessory muscles.  Cardiac: RRR, no JVD Vascular: Large varicosities present extensively, the are greater than 10 mm  affecting the left leg.  Mild venous stasis changes to the legs bilaterally.  2+ soft pitting edema Vessel Right Left  Radial Palpable Palpable  PT Palpable Palpable  DP Palpable Palpable  Gastrointestinal: Non-distended. No guarding/no peritoneal signs.  Musculoskeletal: M/S 5/5 throughout.  No deformity or atrophy.  Neurologic: CN 2-12 intact. Symmetrical.  Speech is fluent. Motor exam as listed above. Psychiatric: Judgment intact, Mood & affect appropriate for pt's clinical situation. Dermatologic: mild venous rashes no ulcers noted.  No changes consistent with cellulitis. Lymph : No lichenification or skin changes of chronic lymphedema.  CBC Lab Results  Component Value Date   WBC 7.1 05/12/2017   HGB 14.0 05/12/2017  HCT 39.8 05/12/2017   MCV 87.9 05/12/2017   PLT 214 05/12/2017    BMET    Component Value Date/Time   NA 138 07/19/2016 1556   NA 136 09/06/2012 0718   K 3.9 07/19/2016 1556   K 3.4 (L) 09/06/2012 0718   CL 94 (L) 07/19/2016 1556   CL 104 09/06/2012 0718   CO2 26 07/19/2016 1556   CO2 27 09/06/2012 0718   GLUCOSE 96 07/19/2016 1556   GLUCOSE 102 (H) 03/25/2016 1129   GLUCOSE 102 (H) 09/06/2012 0718   BUN 17 07/19/2016 1556   BUN 9 09/06/2012 0718   CREATININE 0.92 07/19/2016 1556   CREATININE 1.01 09/06/2012 0718   CALCIUM 10.3 07/19/2016 1556   CALCIUM 8.9 09/06/2012 0718   GFRNONAA 64 07/19/2016 1556   GFRNONAA 58 (L) 09/06/2012 0718   GFRAA 73 07/19/2016 1556   GFRAA >60 09/06/2012 0718   CrCl cannot be calculated (Patient's most recent lab result is older than the maximum 21 days allowed.).  COAG No results found for: INR, PROTIME  Radiology No results found.    Assessment/Plan 1. Leg pain, left  Recommend:  The patient has large symptomatic varicose veins that are painful and associated with swelling.  I have had a long discussion with the patient regarding  varicose veins and why they cause symptoms.  Patient will continue  wearing graduated compression stockings class 1 on a daily basis, beginning first thing in the morning and removing them in the evening. The patient is reminded specifically not to sleep in the stockings.    The patient  will also begin using over-the-counter analgesics such as Motrin 600 mg po TID to help control the symptoms.    In addition, behavioral modification including elevation during the day will be initiated.    Pending the results of these changes the  patient will be reevaluated with an ultrasound of the venous system.   Further plans will be based on the ultrasound results and whether conservative therapies are successful at eliminating the pain and swelling.    - VAS [place] LOWER EXTREMITY VENOUS REFLUX; Future  2. Varicose veins of both lower extremities with pain  Recommend:  The patient has large symptomatic varicose veins that are painful and associated with swelling.  I have had a long discussion with the patient regarding  varicose veins and why they cause symptoms.  Patient will continue wearing graduated compression stockings class 1 on a daily basis, beginning first thing in the morning and removing them in the evening. The patient is reminded specifically not to sleep in the stockings.    The patient  will also begin using over-the-counter analgesics such as Motrin 600 mg po TID to help control the symptoms.    In addition, behavioral modification including elevation during the day will be initiated.    Pending the results of these changes the  patient will be reevaluated with an ultrasound of the venous system.   Further plans will be based on the ultrasound results and whether conservative therapies are successful at eliminating the pain and swelling.   - VAS [place] LOWER EXTREMITY VENOUS REFLUX; Future  3. Chronic venous insufficiency No surgery or intervention at this point in time.    I have had a long discussion with the patient regarding venous insufficiency  and why it  causes symptoms. I have discussed with the patient the chronic skin changes that accompany venous insufficiency and the long term sequela such as infection and ulceration.  Patient will begin wearing graduated compression stockings class 1 (20-30 mmHg) or compression wraps on a daily basis a prescription was given. The patient will put the stockings on first thing in the morning and removing them in the evening. The patient is instructed specifically not to sleep in the stockings.    In addition, behavioral modification including several periods of elevation of the lower extremities during the day will be continued. I have demonstrated that proper elevation is a position with the ankles at heart level.  The patient is instructed to begin routine exercise, especially walking on a daily basis  Patient should undergo duplex ultrasound of the venous system to ensure that DVT or reflux is not present.  4. Osteoarthritis of multiple joints, unspecified osteoarthritis type Continue NSAID medications as already ordered, these medications have been reviewed and there are no changes at this time.  Continued activity and therapy was stressed.    5. Hyperlipidemia, unspecified hyperlipidemia type Continue statin as ordered and reviewed, no changes at this time   Levora DredgeGregory Mayo Owczarzak, MD  10/01/2017 2:10 PM

## 2017-10-03 ENCOUNTER — Encounter (INDEPENDENT_AMBULATORY_CARE_PROVIDER_SITE_OTHER): Payer: Self-pay | Admitting: Vascular Surgery

## 2017-10-03 DIAGNOSIS — M79605 Pain in left leg: Secondary | ICD-10-CM | POA: Insufficient documentation

## 2017-11-15 ENCOUNTER — Encounter (INDEPENDENT_AMBULATORY_CARE_PROVIDER_SITE_OTHER): Payer: Medicare Other

## 2017-11-15 ENCOUNTER — Ambulatory Visit (INDEPENDENT_AMBULATORY_CARE_PROVIDER_SITE_OTHER): Payer: Medicare Other | Admitting: Nurse Practitioner

## 2017-12-13 ENCOUNTER — Encounter (INDEPENDENT_AMBULATORY_CARE_PROVIDER_SITE_OTHER): Payer: Self-pay | Admitting: Vascular Surgery

## 2017-12-13 ENCOUNTER — Ambulatory Visit (INDEPENDENT_AMBULATORY_CARE_PROVIDER_SITE_OTHER): Payer: Medicare Other

## 2017-12-13 ENCOUNTER — Ambulatory Visit (INDEPENDENT_AMBULATORY_CARE_PROVIDER_SITE_OTHER): Payer: Medicare Other | Admitting: Vascular Surgery

## 2017-12-13 VITALS — BP 137/80 | HR 72 | Resp 16 | Ht 63.0 in | Wt 176.0 lb

## 2017-12-13 DIAGNOSIS — E785 Hyperlipidemia, unspecified: Secondary | ICD-10-CM

## 2017-12-13 DIAGNOSIS — I83813 Varicose veins of bilateral lower extremities with pain: Secondary | ICD-10-CM

## 2017-12-13 DIAGNOSIS — M79605 Pain in left leg: Secondary | ICD-10-CM | POA: Diagnosis not present

## 2017-12-13 DIAGNOSIS — I872 Venous insufficiency (chronic) (peripheral): Secondary | ICD-10-CM | POA: Diagnosis not present

## 2017-12-13 DIAGNOSIS — Z87891 Personal history of nicotine dependence: Secondary | ICD-10-CM

## 2017-12-13 DIAGNOSIS — I10 Essential (primary) hypertension: Secondary | ICD-10-CM | POA: Diagnosis not present

## 2017-12-14 ENCOUNTER — Encounter (INDEPENDENT_AMBULATORY_CARE_PROVIDER_SITE_OTHER): Payer: Self-pay | Admitting: Vascular Surgery

## 2017-12-14 NOTE — Progress Notes (Signed)
MRN : [id number]  Carol [name] is a 72 y.o. ([birth year]) female who presents with chief complaint of  Chief Complaint  Patient presents with  . Follow-up    Venous duplex  .  History of Present Illness: The patient returns to the office for followup status post laser ablation of the left GSV saphenous vein.  The patient notes some improvement in the lower extremity pain but not resolution of the symptoms. The patient notes multiple residual varicosities bilaterally which continued to hurt with dependent positions and remained tender to palpation. The patient's swelling is minimally from preoperative status. The patient continues to wear graduated compression stockings on a daily basis but these are not eliminating the pain and discomfort. The patient continues to use over-the-counter anti-inflammatory medications to treat the pain and related symptoms but this has not given the patient relief. The patient notes the pain in the lower extremities is causing problems with daily exercise, problems at work and even with household activities such as preparing meals and doing dishes.  The patient is otherwise done well and there have been no complications related to the laser procedure or interval changes in the patient's overall   Post laser ultrasound shows successful ablation of the left GSV    Current Meds  Medication Sig  . albuterol (VENTOLIN HFA) 108 (90 Base) MCG/ACT inhaler INHALE 2 PUFFS BY MOUTH EVERY 6 HOURS AS NEEDED FOR WHEEZE  . azelastine (ASTELIN) 0.1 % nasal spray SPRAY ONCE IN EACH NOSTRIL DAILY  . Celecoxib (CELEBREX PO) Celebrex  . cetirizine (ZYRTEC) 10 MG tablet Take 1 tablet (10 mg total) by mouth daily as needed for allergies.  . chlorthalidone (HYGROTON) 25 MG tablet Take 1 tablet (25 mg total) by mouth daily before breakfast.  . cholecalciferol (VITAMIN D) 1000 units tablet Take 1,000 Units by mouth daily.  . clindamycin (CLEOCIN) 150 MG capsule TAKE FOUR TABS PRIOR  TO DENTAL APPOINMENT  . clonazePAM (KLONOPIN) 0.5 MG tablet Take 0.5 mg by mouth daily as needed for anxiety.  . diclofenac sodium (VOLTAREN) 1 % GEL Apply 1 application topically 4 (four) times daily. Dr Kathi Ludwig  . escitalopram (LEXAPRO) 20 MG tablet Take by mouth.  . fluticasone (FLONASE) 50 MCG/ACT nasal spray Place 2 sprays into both nostrils daily.  . Inulin (FIBER CHOICE PO) Take 3 tablets by mouth daily.  Marland Kitchen PARoxetine Mesylate (BRISDELLE PO) [place]  . rizatriptan (MAXALT) 10 MG tablet Take 1 tablet by mouth single dose as directed [for migraine, may repeat in 2 hours if still with migraine] as needed  . rosuvastatin (CRESTOR) 5 MG tablet Take 5 mg by mouth daily.  . SUMAtriptan (IMITREX) 100 MG tablet Take 1 tablet (100 mg total) by mouth daily as needed for migraine. May repeat in 2 hours if headache persists or recurs.  . vitamin B-12 (CYANOCOBALAMIN) 1000 MCG tablet Take 1 tablet (1,000 mcg total) by mouth daily.    Past Medical History:  Diagnosis Date  . Anxiety   . Arthritis   . Depression   . Headache    migraine - last one 2016  . Hypertension    reason that she was given Hygroton & she remarks that her BP has been better she has been taking it.   Marland Kitchen Positive skin test for tuberculosis    told that she is a carrier,     Past Surgical History:  Procedure Laterality Date  . ABDOMINAL HYSTERECTOMY    . BACK SURGERY  x3 previous cervical fusion & then removed hardware   . BREAST SURGERY Right    benign-   . HAND TENDON SURGERY Right 2016  . JOINT REPLACEMENT Right    also had a partial on the L   . LUMBAR FUSION    . MYRINGOTOMY WITH TUBE PLACEMENT Right   . TONSILLECTOMY    . TUBAL LIGATION     had a 2nd. surgery for adhesions    Social History Social History   Tobacco Use  . Smoking status: Former Smoker    Types: Cigarettes    Last attempt to quit: 03/15/1976    Years since quitting: 41.7  . Smokeless tobacco: Never Used  Substance Use Topics  .  Alcohol use: No  . Drug use: Yes    Frequency: 1.0 times per week    Types: Marijuana    Comment: occasionally     Family History Family History  Problem Relation Age of Onset  . Aneurysm Mother   . Lung cancer Brother     Allergies  Allergen Reactions  . Gentamicin Rash, Itching and Other (See Comments)  . Hydralazine Nausea And Vomiting, Nausea Only and Other (See Comments)  . Ibandronic Acid Other (See Comments)    cracked teeth and joint pain cracked teeth and joint pain  . Penicillins Hives and Other (See Comments)    Has patient had a PCN reaction causing immediate rash, facial/tongue/throat swelling, SOB or lightheadedness with hypotension: No Has patient had a PCN reaction causing severe rash involving mucus membranes or skin necrosis: Yes Has patient had a PCN reaction that required hospitalization No Has patient had a PCN reaction occurring within the last 10 years: No If all of the above answers are "NO", then may proceed with Cephalosporin use.      REVIEW OF SYSTEMS (Negative unless checked)  Constitutional: [] Weight loss  [] Fever  [] Chills Cardiac: [] Chest pain   [] Chest pressure   [] Palpitations   [] Shortness of breath when laying flat   [] Shortness of breath with exertion. Vascular:  [] Pain in legs with walking   [x] Pain in legs at rest  [] History of DVT   [] Phlebitis   [x] Swelling in legs   [x] Varicose veins   [] Non-healing ulcers Pulmonary:   [] Uses home oxygen   [] Productive cough   [] Hemoptysis   [] Wheeze  [] COPD   [] Asthma Neurologic:  [] Dizziness   [] Seizures   [] History of stroke   [] History of TIA  [] Aphasia   [] Vissual changes   [] Weakness or numbness in arm   [] Weakness or numbness in leg Musculoskeletal:   [] Joint swelling   [] Joint pain   [] Low back pain Hematologic:  [] Easy bruising  [] Easy bleeding   [] Hypercoagulable state   [] Anemic Gastrointestinal:  [] Diarrhea   [] Vomiting  [] Gastroesophageal reflux/heartburn   [] Difficulty  swallowing. Genitourinary:  [] Chronic kidney disease   [] Difficult urination  [] Frequent urination   [] Blood in urine Skin:  [] Rashes   [] Ulcers  Psychological:  [] History of anxiety   []  History of major depression.  Physical Examination  Vitals:   12/13/17 1536  BP: 137/80  Pulse: 72  Resp: 16  Weight: 176 lb (79.8 kg)  Height: 5\' 3"  (1.6 m)   Body mass index is 31.18 kg/m. Gen: WD/WN, NAD Head: [place]/AT, No temporalis wasting.  Ear/Nose/Throat: Hearing grossly intact, nares w/o erythema or drainage Eyes: PER, EOMI, sclera nonicteric.  Neck: Supple, no large masses.   Pulmonary:  Good air movement, no audible wheezing bilaterally, no use of accessory  muscles.  Cardiac: RRR, no JVD Vascular: Large varicosities present extensively greater than 10 mm left.  Mild venous stasis changes to the legs bilaterally.  2+ soft pitting edema Vessel Right Left  Radial Palpable Palpable  PT Palpable Palpable  DP Palpable Palpable  Gastrointestinal: Non-distended. No guarding/no peritoneal signs.  Musculoskeletal: M/S 5/5 throughout.  No deformity or atrophy.  Neurologic: CN 2-12 intact. Symmetrical.  Speech is fluent. Motor exam as listed above. Psychiatric: Judgment intact, Mood & affect appropriate for pt's clinical situation. Dermatologic: mild venous rashes no ulcers noted.  No changes consistent with cellulitis. Lymph : No lichenification or skin changes of chronic lymphedema.  CBC Lab Results  Component Value Date   WBC 7.1 05/12/2017   HGB 14.0 05/12/2017   HCT 39.8 05/12/2017   MCV 87.9 05/12/2017   PLT 214 05/12/2017    BMET    Component Value Date/Time   NA 138 07/19/2016 1556   NA 136 09/06/2012 0718   K 3.9 07/19/2016 1556   K 3.4 (L) 09/06/2012 0718   CL 94 (L) 07/19/2016 1556   CL 104 09/06/2012 0718   CO2 26 07/19/2016 1556   CO2 27 09/06/2012 0718   GLUCOSE 96 07/19/2016 1556   GLUCOSE 102 (H) 03/25/2016 1129   GLUCOSE 102 (H) 09/06/2012 0718   BUN 17  07/19/2016 1556   BUN 9 09/06/2012 0718   CREATININE 0.92 07/19/2016 1556   CREATININE 1.01 09/06/2012 0718   CALCIUM 10.3 07/19/2016 1556   CALCIUM 8.9 09/06/2012 0718   GFRNONAA 64 07/19/2016 1556   GFRNONAA 58 (L) 09/06/2012 0718   GFRAA 73 07/19/2016 1556   GFRAA >60 09/06/2012 0718   CrCl cannot be calculated (Patient's most recent lab result is older than the maximum 21 days allowed.).  COAG No results found for: INR, PROTIME  Radiology No results found.   Assessment/Plan 1. Varicose veins of both lower extremities with pain Recommend:  The patient has had successful ablation of the previously incompetent saphenous venous system but still has persistent symptoms of pain and swelling that are having a negative impact on daily life and daily activities.  Patient should undergo injection sclerotherapy to treat the residual large painful left leg varicosities.  The risks, benefits and alternative therapies were reviewed in detail with the patient.  All questions were answered.  The patient agrees to proceed with sclerotherapy at their convenience.  The patient will continue wearing the graduated compression stockings and using the over-the-counter pain medications to treat her symptoms.     2. Chronic venous insufficiency No surgery or intervention at this point in time.    I have had a long discussion with the patient regarding venous insufficiency and why it  causes symptoms. I have discussed with the patient the chronic skin changes that accompany venous insufficiency and the long term sequela such as infection and ulceration.  Patient will begin wearing graduated compression stockings class 1 (20-30 mmHg) or compression wraps on a daily basis a prescription was given. The patient will put the stockings on first thing in the morning and removing them in the evening. The patient is instructed specifically not to sleep in the stockings.    In addition, behavioral modification  including several periods of elevation of the lower extremities during the day will be continued. I have demonstrated that proper elevation is a position with the ankles at heart level.  The patient is instructed to begin routine exercise, especially walking on a daily basis  3.  Essential hypertension Continue antihypertensive medications as already ordered, these medications have been reviewed and there are no changes at this time.   4. Hyperlipidemia, unspecified hyperlipidemia type Continue statin as ordered and reviewed, no changes at this time   Levora Dredge, MD  12/14/2017 7:51 AM

## 2017-12-19 IMAGING — RF DG C-ARM 61-120 MIN
1 series · 3 of 3 positions shown · non-contrast
Comparison: Preoperative MRI 02/04/2016

CLINICAL DATA: L2-L5 PLIF.

EXAM:
DG C-ARM 61-120 MIN; LUMBAR SPINE - 2-3 VIEW

[Series 1: run · 3 of 3 slices shown]
[im 1/3]
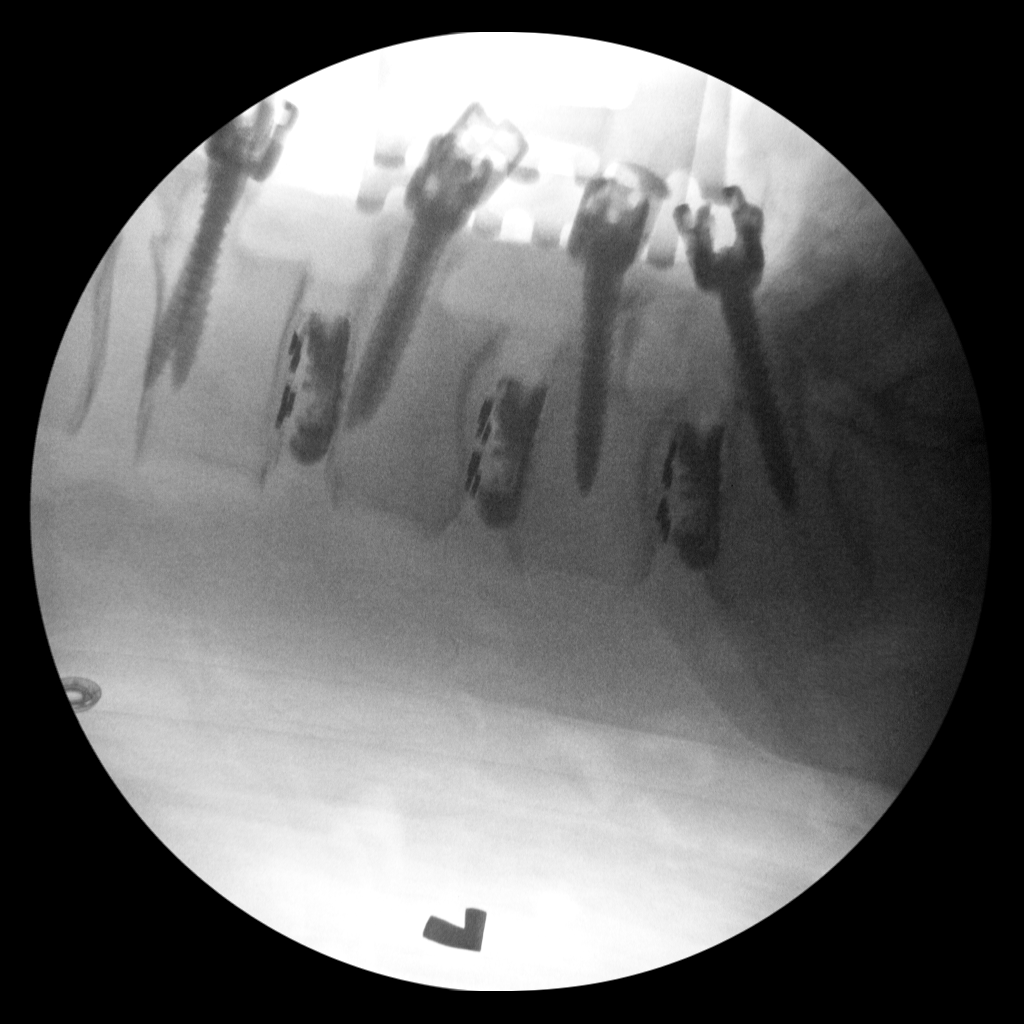
[im 2/3]
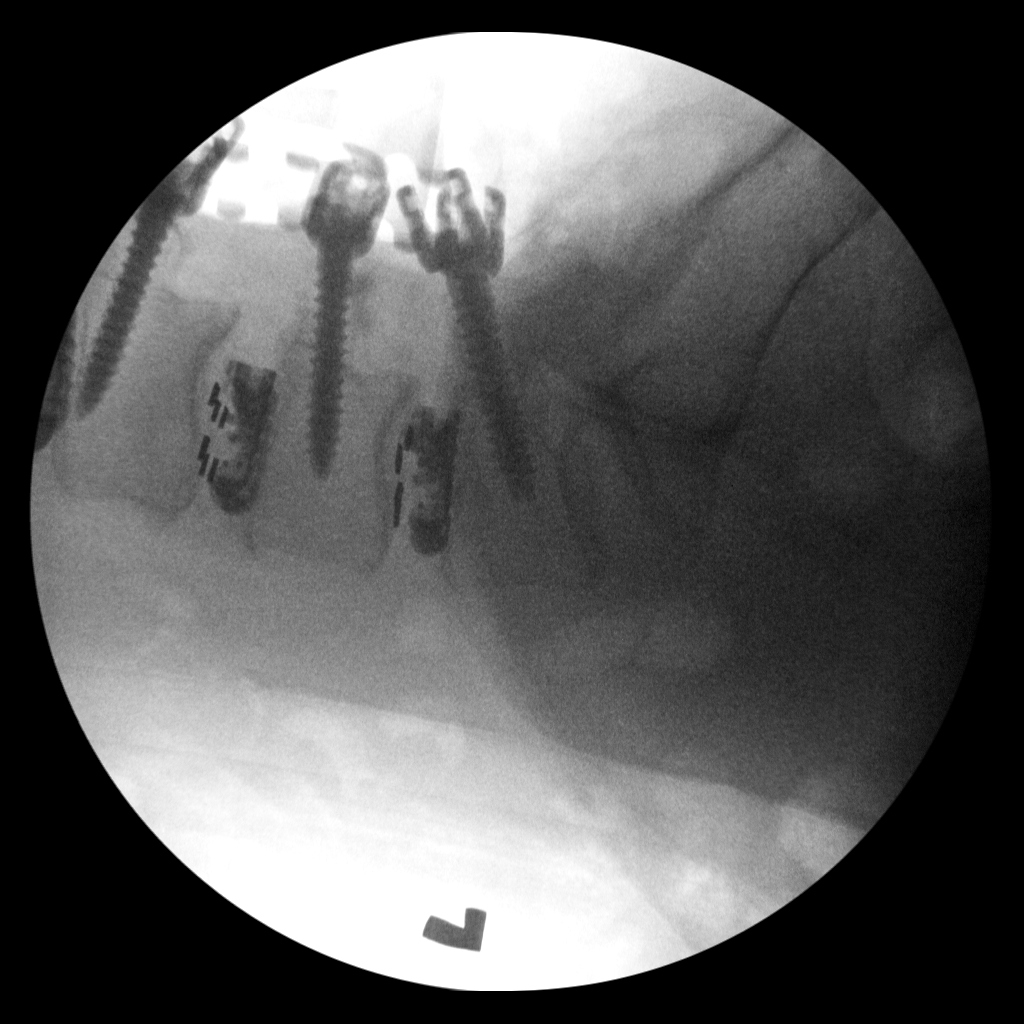
[im 3/3]
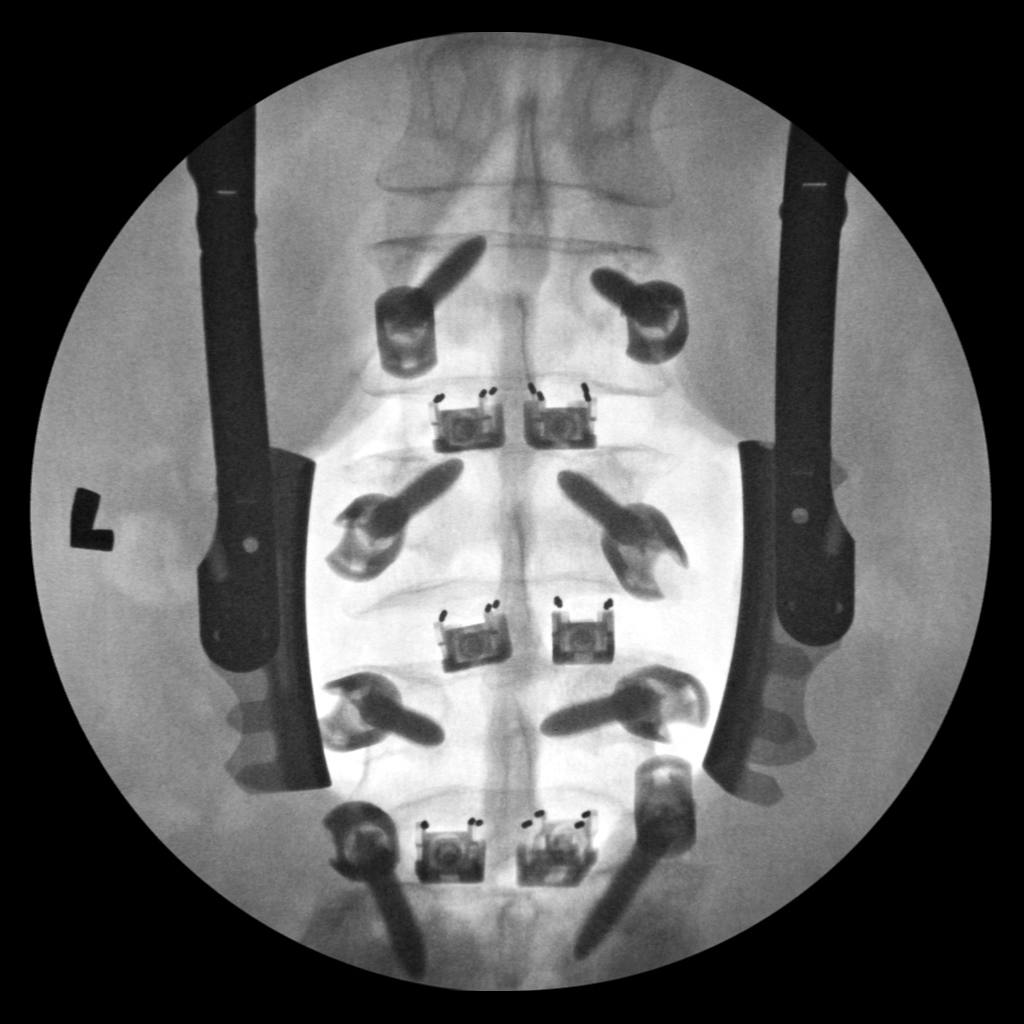

[3 of 3 positions shown; findings below may reference images not displayed]

FINDINGS: Spinal numbering as on preoperative study. Discectomy and pedicle
screws placed from L2-L5. The L2 pedicle screws reach but do not
visibly penetrate the superior endplate. Located intervertebral
grafts. No detected fracture. Mild anterolisthesis at L5-S1 also
seen on preoperative study.
IMPRESSION: L2-L5 discectomy and fusion.  No unexpected finding.

## 2017-12-23 IMAGING — DX DG CHEST 2V
2 series · 2 of 2 positions shown · non-contrast
Comparison: None.

CLINICAL DATA: Postop fever.

EXAM:
CHEST  2 VIEW

[w chest pa]
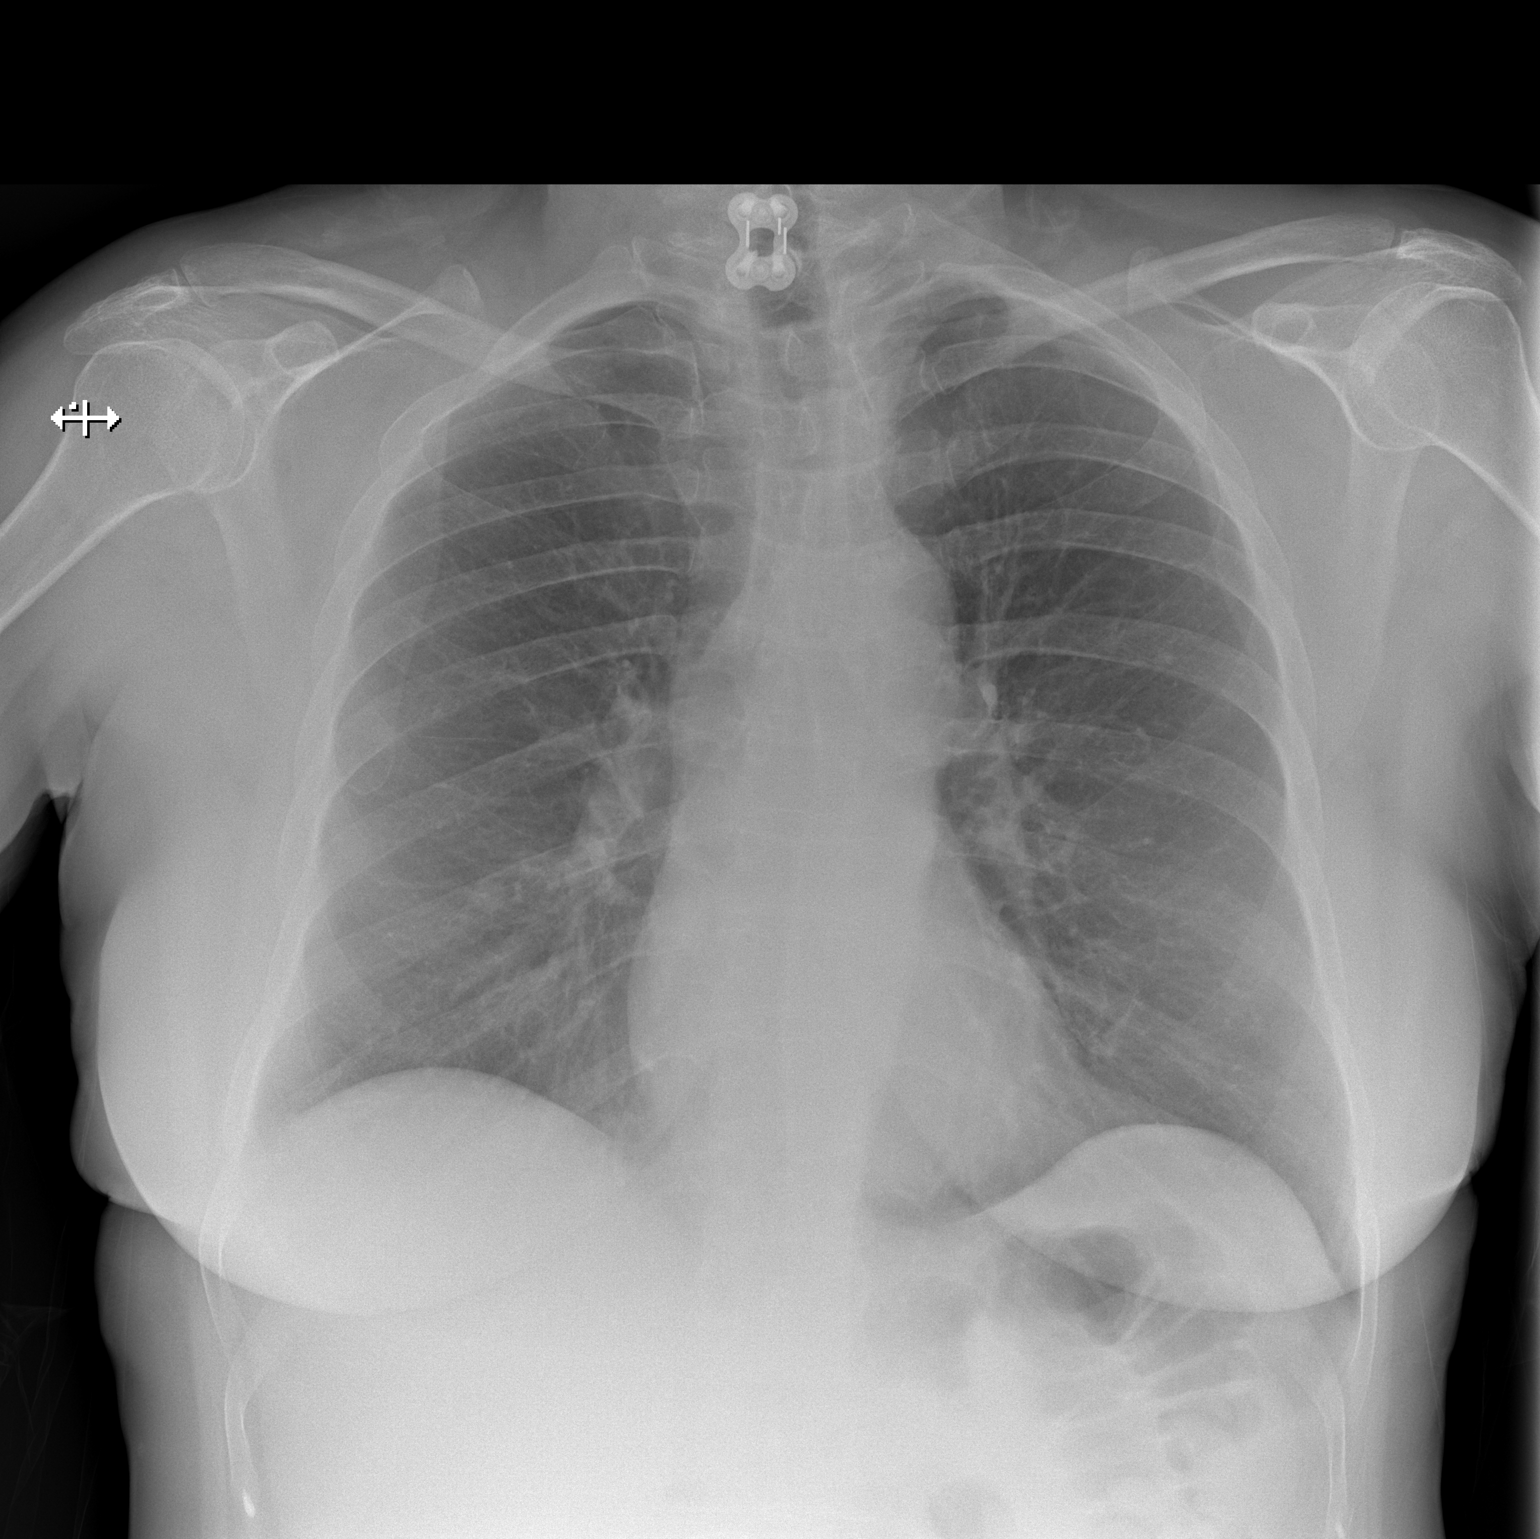

[w chest lat]
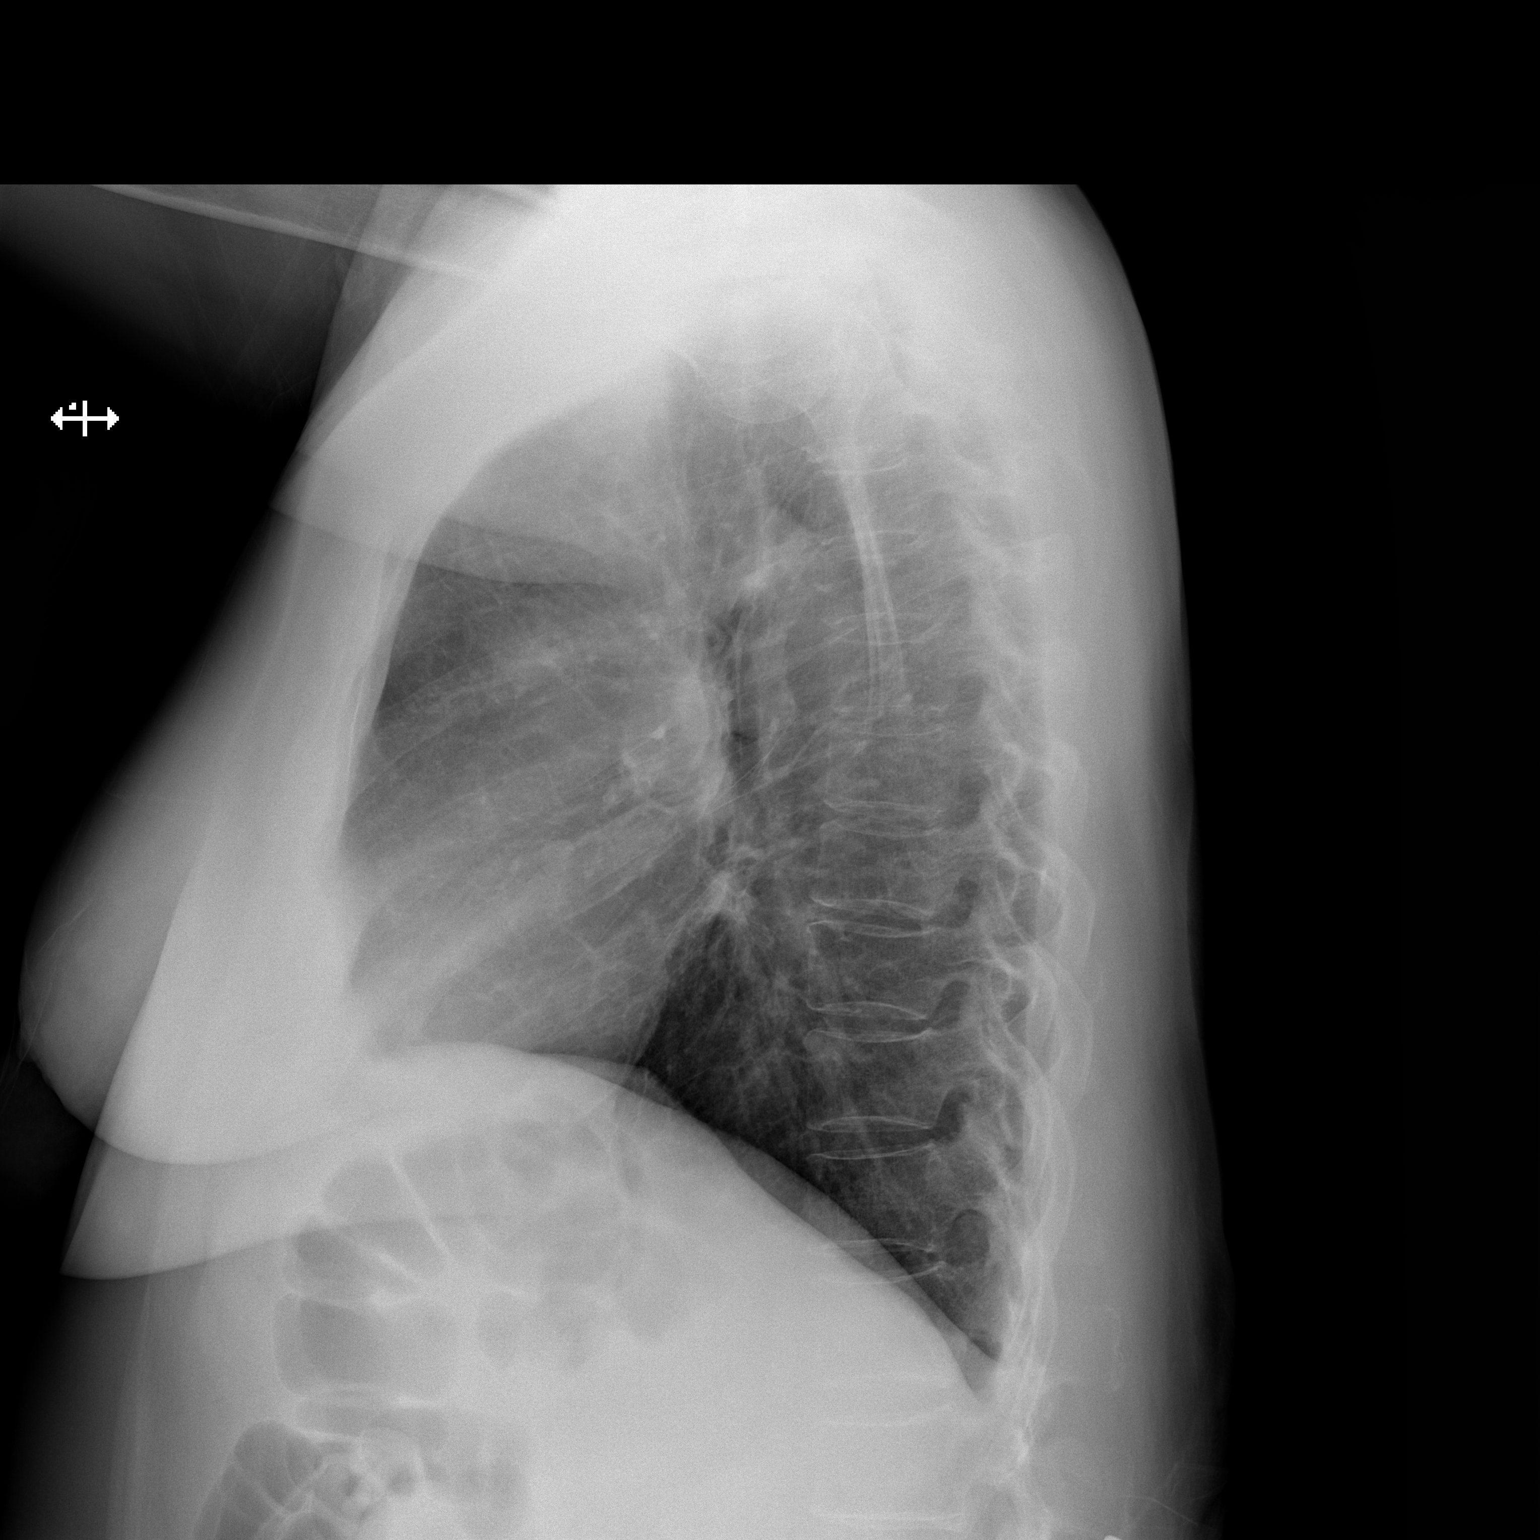

[2 of 2 positions shown; findings below may reference images not displayed]

FINDINGS: The heart size and mediastinal contours are within normal limits.
Both lungs are clear. The visualized skeletal structures are
unremarkable.
IMPRESSION: No active cardiopulmonary disease.

## 2018-01-01 ENCOUNTER — Ambulatory Visit (INDEPENDENT_AMBULATORY_CARE_PROVIDER_SITE_OTHER): Payer: Medicare Other | Admitting: Vascular Surgery

## 2018-01-01 ENCOUNTER — Encounter (INDEPENDENT_AMBULATORY_CARE_PROVIDER_SITE_OTHER): Payer: Self-pay | Admitting: Vascular Surgery

## 2018-01-01 VITALS — BP 127/76 | HR 82 | Resp 17 | Ht 64.0 in | Wt 174.0 lb

## 2018-01-01 DIAGNOSIS — I83813 Varicose veins of bilateral lower extremities with pain: Secondary | ICD-10-CM

## 2018-01-01 NOTE — Progress Notes (Signed)
Varicose veins of bilateral  lower extremity with inflammation (454.1  I83.10) Current Plans   Indication: Patient presents with symptomatic varicose veins of the bilateral  lower extremity.   Procedure: Sclerotherapy using hypertonic saline mixed with 1% Lidocaine was performed on the bilateral lower extremity. Compression wraps were placed. The patient tolerated the procedure well. 

## 2018-01-22 ENCOUNTER — Ambulatory Visit (INDEPENDENT_AMBULATORY_CARE_PROVIDER_SITE_OTHER): Payer: Medicare Other | Admitting: Vascular Surgery

## 2018-01-30 ENCOUNTER — Ambulatory Visit (INDEPENDENT_AMBULATORY_CARE_PROVIDER_SITE_OTHER): Payer: Medicare Other | Admitting: Vascular Surgery

## 2018-02-12 ENCOUNTER — Ambulatory Visit (INDEPENDENT_AMBULATORY_CARE_PROVIDER_SITE_OTHER): Payer: Medicare Other | Admitting: Vascular Surgery

## 2018-02-13 ENCOUNTER — Ambulatory Visit (INDEPENDENT_AMBULATORY_CARE_PROVIDER_SITE_OTHER): Payer: Medicare Other | Admitting: Vascular Surgery

## 2018-02-18 DIAGNOSIS — M189 Osteoarthritis of first carpometacarpal joint, unspecified: Secondary | ICD-10-CM | POA: Insufficient documentation

## 2018-03-06 ENCOUNTER — Encounter (INDEPENDENT_AMBULATORY_CARE_PROVIDER_SITE_OTHER): Payer: Self-pay | Admitting: Vascular Surgery

## 2018-03-06 ENCOUNTER — Ambulatory Visit (INDEPENDENT_AMBULATORY_CARE_PROVIDER_SITE_OTHER): Payer: Medicare Other | Admitting: Vascular Surgery

## 2018-03-06 VITALS — BP 130/73 | HR 95 | Resp 17 | Ht 63.0 in | Wt 176.0 lb

## 2018-03-06 DIAGNOSIS — I83813 Varicose veins of bilateral lower extremities with pain: Secondary | ICD-10-CM

## 2018-03-06 DIAGNOSIS — I872 Venous insufficiency (chronic) (peripheral): Secondary | ICD-10-CM

## 2018-03-06 DIAGNOSIS — I8312 Varicose veins of left lower extremity with inflammation: Secondary | ICD-10-CM | POA: Diagnosis not present

## 2018-03-06 NOTE — Progress Notes (Signed)
Varicose veins of left lower extremity with inflammation (454.1  I83.10) Current Plans   Indication: Patient presents with symptomatic varicose veins of the left lower extremity.   Procedure: Sclerotherapy using hypertonic saline mixed with 1% Lidocaine was performed on the left lower extremity. Compression wraps were placed. The patient tolerated the procedure well. 

## 2018-03-21 DIAGNOSIS — M189 Osteoarthritis of first carpometacarpal joint, unspecified: Secondary | ICD-10-CM | POA: Diagnosis not present

## 2018-04-03 ENCOUNTER — Ambulatory Visit (INDEPENDENT_AMBULATORY_CARE_PROVIDER_SITE_OTHER): Payer: PPO | Admitting: Vascular Surgery

## 2018-05-02 DIAGNOSIS — M189 Osteoarthritis of first carpometacarpal joint, unspecified: Secondary | ICD-10-CM | POA: Diagnosis not present

## 2018-06-13 ENCOUNTER — Encounter: Payer: Self-pay | Admitting: *Deleted

## 2018-07-10 DIAGNOSIS — G5601 Carpal tunnel syndrome, right upper limb: Secondary | ICD-10-CM | POA: Diagnosis not present

## 2018-07-17 DIAGNOSIS — G5601 Carpal tunnel syndrome, right upper limb: Secondary | ICD-10-CM | POA: Diagnosis not present

## 2018-07-18 DIAGNOSIS — F419 Anxiety disorder, unspecified: Secondary | ICD-10-CM | POA: Diagnosis not present

## 2018-07-18 DIAGNOSIS — N390 Urinary tract infection, site not specified: Secondary | ICD-10-CM | POA: Diagnosis not present

## 2018-07-18 DIAGNOSIS — R3915 Urgency of urination: Secondary | ICD-10-CM | POA: Diagnosis not present

## 2018-07-18 DIAGNOSIS — Z79899 Other long term (current) drug therapy: Secondary | ICD-10-CM | POA: Diagnosis not present

## 2018-07-18 DIAGNOSIS — I10 Essential (primary) hypertension: Secondary | ICD-10-CM | POA: Diagnosis not present

## 2018-07-18 DIAGNOSIS — E782 Mixed hyperlipidemia: Secondary | ICD-10-CM | POA: Diagnosis not present

## 2018-07-18 DIAGNOSIS — F329 Major depressive disorder, single episode, unspecified: Secondary | ICD-10-CM | POA: Diagnosis not present

## 2018-07-18 DIAGNOSIS — J449 Chronic obstructive pulmonary disease, unspecified: Secondary | ICD-10-CM | POA: Diagnosis not present

## 2018-07-18 DIAGNOSIS — Z0000 Encounter for general adult medical examination without abnormal findings: Secondary | ICD-10-CM | POA: Diagnosis not present

## 2018-07-19 DIAGNOSIS — M189 Osteoarthritis of first carpometacarpal joint, unspecified: Secondary | ICD-10-CM | POA: Diagnosis not present

## 2018-07-26 DIAGNOSIS — S63501A Unspecified sprain of right wrist, initial encounter: Secondary | ICD-10-CM | POA: Diagnosis not present

## 2018-07-26 DIAGNOSIS — S8002XA Contusion of left knee, initial encounter: Secondary | ICD-10-CM | POA: Diagnosis not present

## 2018-08-06 ENCOUNTER — Other Ambulatory Visit: Payer: Self-pay | Admitting: *Deleted

## 2018-08-06 ENCOUNTER — Encounter: Payer: Self-pay | Admitting: *Deleted

## 2018-08-06 NOTE — Patient Outreach (Signed)
[facility]) Care Management  08/06/2018  Carol [name] 07-Apr-1945 [id number]  Follow up from HRA screening phone call attempts placed to patient on 05/20/2018, 05/22/2018, and 06/13/2018- all calls were unsuccessful and patient was mailed unsuccessful letter on 06/13/2018 without patient call-back to date.  Patient to be followed in [facility] HRA engaged program.   Plan:  Will re-attempt call to patient within 2 months of initial unsuccessful attempts previously made  Caryl Pina, RN, BSN, [employer] Coordinator [facility] Care Management  [phone]

## 2018-08-08 ENCOUNTER — Other Ambulatory Visit: Payer: Self-pay | Admitting: Student

## 2018-08-08 ENCOUNTER — Other Ambulatory Visit (HOSPITAL_COMMUNITY): Payer: Self-pay | Admitting: Student

## 2018-08-08 DIAGNOSIS — M5412 Radiculopathy, cervical region: Secondary | ICD-10-CM

## 2018-08-13 ENCOUNTER — Other Ambulatory Visit: Payer: Self-pay | Admitting: *Deleted

## 2018-08-13 NOTE — Patient Outreach (Addendum)
[facility]) Care Management [facility] CM HTA- HRA screening outreach- insurance referral 08/13/2018  AMBERLI SALT 1945/10/26 [id number]  Follow up from successful outreach to patient today for HTA HRA screening phone call. HIPAA/ identity verified.  Patient denies ongoing chronic conditions and reports independent in care needs for ADL and IADL; denies issues/ concerns with medications.  Patient reports that she would like information around Advanced Directive planning- basics of Advanced Directive planning were discussed with patient today.  Plan:  Patient to be followed in HRA engaged program. Patient will receive a welcome letter and educational material with Advanced Directive planning packet   Will place order for [facility] CSW/ BSW for follow up around Advanced Directives  Caryl Pina, RN, BSN, CCRN Alumnus [facility] Coordinator [facility] Care Management  [phone]

## 2018-08-14 DIAGNOSIS — Z6831 Body mass index (BMI) 31.0-31.9, adult: Secondary | ICD-10-CM | POA: Diagnosis not present

## 2018-08-14 DIAGNOSIS — M461 Sacroiliitis, not elsewhere classified: Secondary | ICD-10-CM | POA: Diagnosis not present

## 2018-08-14 DIAGNOSIS — R030 Elevated blood-pressure reading, without diagnosis of hypertension: Secondary | ICD-10-CM | POA: Diagnosis not present

## 2018-08-21 DIAGNOSIS — K529 Noninfective gastroenteritis and colitis, unspecified: Secondary | ICD-10-CM | POA: Diagnosis not present

## 2018-08-21 DIAGNOSIS — R399 Unspecified symptoms and signs involving the genitourinary system: Secondary | ICD-10-CM | POA: Diagnosis not present

## 2018-08-22 ENCOUNTER — Ambulatory Visit: Payer: PPO

## 2018-08-23 ENCOUNTER — Other Ambulatory Visit: Payer: Self-pay

## 2018-08-23 NOTE — Patient Outreach (Signed)
[facility]) Care Management  08/23/2018  Carol [name] 10-09-1945 [id number]   Successful outreach to patient to ensure receipt of and review Advance Directives documentation that was sent on 08/13/18.  Patient confirmed receipt but said that she has been fighting a stomach virus and has not reviewed information.  She requested a call back next week.  Malachy Chamber, BSW Social Worker [phone]

## 2018-08-29 ENCOUNTER — Other Ambulatory Visit: Payer: Self-pay

## 2018-08-29 NOTE — Patient Outreach (Signed)
[facility]) Care Management  08/29/2018  RONEISHA STERN [birth year] [id number]   Successful outreach to patient to review Advance Directives, however, she requested to call BSW back because she had just woken up.  BSW will follow up with her again next week if no return call.  Ronn Melena, BSW Social Worker [phone]

## 2018-09-02 ENCOUNTER — Ambulatory Visit: Payer: Self-pay

## 2018-09-02 ENCOUNTER — Other Ambulatory Visit: Payer: Self-pay

## 2018-09-02 ENCOUNTER — Ambulatory Visit
Admission: RE | Admit: 2018-09-02 | Discharge: 2018-09-02 | Disposition: A | Discharge status: Home / Self Care | Payer: PPO | Source: Ambulatory Visit | Attending: Student | Admitting: Student

## 2018-09-02 DIAGNOSIS — M4802 Spinal stenosis, cervical region: Secondary | ICD-10-CM | POA: Diagnosis not present

## 2018-09-02 DIAGNOSIS — M2578 Osteophyte, vertebrae: Secondary | ICD-10-CM | POA: Diagnosis not present

## 2018-09-02 DIAGNOSIS — M8938 Hypertrophy of bone, other site: Secondary | ICD-10-CM | POA: Diagnosis not present

## 2018-09-02 DIAGNOSIS — M5412 Radiculopathy, cervical region: Secondary | ICD-10-CM | POA: Diagnosis not present

## 2018-09-02 DIAGNOSIS — M4312 Spondylolisthesis, cervical region: Secondary | ICD-10-CM | POA: Diagnosis not present

## 2018-09-02 LAB — POCT I-STAT CREATININE: Creatinine, Ser: 1.1 mg/dL — ABNORMAL HIGH (ref 0.44–1.00)

## 2018-09-02 MED ORDER — GADOBUTROL 1 MMOL/ML IV SOLN
7.5000 mL | Freq: Once | INTRAVENOUS | Status: AC | PRN
  Administered 2018-09-02: 7 mL via INTRAVENOUS

## 2018-09-03 DIAGNOSIS — M5412 Radiculopathy, cervical region: Secondary | ICD-10-CM | POA: Diagnosis not present

## 2018-09-03 DIAGNOSIS — Z6830 Body mass index (BMI) 30.0-30.9, adult: Secondary | ICD-10-CM | POA: Diagnosis not present

## 2018-09-03 DIAGNOSIS — I10 Essential (primary) hypertension: Secondary | ICD-10-CM | POA: Diagnosis not present

## 2018-09-05 ENCOUNTER — Other Ambulatory Visit: Payer: Self-pay

## 2018-09-05 ENCOUNTER — Ambulatory Visit: Payer: Self-pay

## 2018-09-05 NOTE — Patient Outreach (Signed)
[facility]) Care Management  09/05/2018  Carol [name] [birth year] [id number]   Final attempt to reach patient for review of Advance Directives.  Left voicemail message.  BSW is closing case at this time.  Ronn Melena, BSW Social Worker [phone]

## 2018-09-10 ENCOUNTER — Other Ambulatory Visit: Payer: Self-pay | Admitting: *Deleted

## 2018-09-10 NOTE — Patient Outreach (Signed)
[facility]) Care Management [facility] CM case closure note- patient active with external program 09/10/2018  FLORRIE RAMIRES 06-10-1945 [id number]  [facility] Care Management Case Closure note from previously placed HTA HRA screening phone call  Received confirmation from [place] leadership team that patient is now active with an external program  Plan:  Will close patient case and make patient inactive with [facility] CM  Oneta Rack, RN, BSN, [place] Coordinator [facility] Care Management  [phone]

## 2018-10-03 DIAGNOSIS — G5601 Carpal tunnel syndrome, right upper limb: Secondary | ICD-10-CM | POA: Diagnosis not present

## 2018-10-16 ENCOUNTER — Other Ambulatory Visit: Payer: Self-pay | Admitting: Specialist

## 2018-10-17 ENCOUNTER — Inpatient Hospital Stay: Admission: RE | Admit: 2018-10-17 | Payer: PPO | Source: Ambulatory Visit

## 2018-10-18 ENCOUNTER — Emergency Department
Admission: EM | Admit: 2018-10-18 | Discharge: 2018-10-18 | Disposition: A | Discharge status: Home / Self Care | Payer: PPO | Attending: Student in an Organized Health Care Education/Training Program | Admitting: Student in an Organized Health Care Education/Training Program

## 2018-10-18 ENCOUNTER — Other Ambulatory Visit: Payer: Self-pay

## 2018-10-18 ENCOUNTER — Emergency Department: Payer: PPO

## 2018-10-18 DIAGNOSIS — I10 Essential (primary) hypertension: Secondary | ICD-10-CM | POA: Insufficient documentation

## 2018-10-18 DIAGNOSIS — S4992XA Unspecified injury of left shoulder and upper arm, initial encounter: Secondary | ICD-10-CM | POA: Diagnosis present

## 2018-10-18 DIAGNOSIS — Y999 Unspecified external cause status: Secondary | ICD-10-CM | POA: Diagnosis not present

## 2018-10-18 DIAGNOSIS — Y9301 Activity, walking, marching and hiking: Secondary | ICD-10-CM | POA: Diagnosis not present

## 2018-10-18 DIAGNOSIS — R51 Headache: Secondary | ICD-10-CM | POA: Insufficient documentation

## 2018-10-18 DIAGNOSIS — R52 Pain, unspecified: Secondary | ICD-10-CM | POA: Diagnosis not present

## 2018-10-18 DIAGNOSIS — S0990XA Unspecified injury of head, initial encounter: Secondary | ICD-10-CM | POA: Diagnosis not present

## 2018-10-18 DIAGNOSIS — W010XXA Fall on same level from slipping, tripping and stumbling without subsequent striking against object, initial encounter: Secondary | ICD-10-CM | POA: Diagnosis not present

## 2018-10-18 DIAGNOSIS — S42212A Unspecified displaced fracture of surgical neck of left humerus, initial encounter for closed fracture: Secondary | ICD-10-CM

## 2018-10-18 DIAGNOSIS — Y92010 Kitchen of single-family (private) house as the place of occurrence of the external cause: Secondary | ICD-10-CM | POA: Insufficient documentation

## 2018-10-18 DIAGNOSIS — Z87891 Personal history of nicotine dependence: Secondary | ICD-10-CM | POA: Diagnosis not present

## 2018-10-18 DIAGNOSIS — W19XXXA Unspecified fall, initial encounter: Secondary | ICD-10-CM | POA: Diagnosis not present

## 2018-10-18 DIAGNOSIS — S42222A 2-part displaced fracture of surgical neck of left humerus, initial encounter for closed fracture: Secondary | ICD-10-CM | POA: Diagnosis not present

## 2018-10-18 MED ORDER — MORPHINE SULFATE (PF) 4 MG/ML IV SOLN
4.0000 mg | Freq: Once | INTRAVENOUS | Status: AC
  Administered 2018-10-18: 16:00:00 4 mg via INTRAVENOUS
  Filled 2018-10-18: qty 1

## 2018-10-18 MED ORDER — OXYCODONE HCL 5 MG PO TABS: 5.0000 mg | ORAL_TABLET | Freq: Three times a day (TID) | ORAL | 0 refills | Status: DC | PRN

## 2018-10-18 MED ORDER — ONDANSETRON HCL 4 MG/2ML IJ SOLN
4.0000 mg | Freq: Once | INTRAMUSCULAR | Status: AC
  Administered 2018-10-18: 4 mg via INTRAVENOUS
  Filled 2018-10-18: qty 2

## 2018-10-18 NOTE — ED Notes (Signed)
First Nurse Note: Pt to ED via [facility]- Pt tripped and fell, denies LOC, Pt is c/o left arm pain- pt was given 75 mcg fentanyl by EMS. Pt has 20 G IV in Right AC. Vital signs WNL.

## 2018-10-18 NOTE — Discharge Instructions (Signed)
Please call Dr. Ammie Ferrier office first thing Monday morning to request an appointment.  Return to the emergency department for any symptom of concern if you are unable to see primary care or the orthopedic specialist.

## 2018-10-18 NOTE — ED Triage Notes (Signed)
Pt tripped over dishwasher door onto left side of body. C/o severe left shoulder pain, bilateral knee pain and left side of face pain. Denies LOC. 20G to R AC by EMS, PTA. Pt alert and oriented X4, active, cooperative, pt in NAD. RR even and unlabored, color WNL.

## 2018-10-18 NOTE — ED Provider Notes (Signed)
[facility] Emergency Department Provider Note ____________________________________________  Time seen: Approximately 3:50 PM  I have reviewed the triage vital signs and the nursing notes.   HISTORY  Chief Complaint Fall and Shoulder Injury    HPI Carol [name] is a 73 y.o. female who presents to the emergency department for evaluation and treatment of left shoulder pain after mechanical, non-syncopal fall at home earlier today.  Patient states she tripped over the dishwasher door and landed on her left side.  She denies loss of consciousness.  She states that both knees are a little sore but she is able to stand and walk without any issues. In route via EMS, she was given 75 mcg of fentanyl with some relief.  Past Medical History:  Diagnosis Date  . Anxiety   . Arthritis   . Depression   . Headache    migraine - last one 2016  . Hypertension    reason that she was given [place] & she remarks that her BP has been better she has been taking it.   Marland Kitchen Positive skin test for tuberculosis    told that she is a carrier,     Patient Active Problem List   Diagnosis Date Noted  . Leg pain, left 10/03/2017  . Obesity (BMI 30.0-34.9) 05/25/2017  . Acquired genu varum 11/30/2016  . Depression 08/25/2016  . Vitamin D deficiency 07/20/2016  . B12 deficiency 07/20/2016  . Gait instability 07/19/2016  . Hypertension 07/19/2016  . Hyperlipidemia 07/19/2016  . Osteopenia of spine 07/19/2016  . History of lumbar fusion 07/19/2016  . Hx of fusion of cervical spine 07/19/2016  . Migraine headache 07/19/2016  . Anxiety disorder 07/19/2016  . Allergic rhinitis 07/19/2016  . Constipation 07/19/2016  . Lung nodules 07/19/2016  . History of positive PPD 07/19/2016  . Patent pressure equalization (PE) tube on right side 07/19/2016  . Varicose veins of both lower extremities with pain 05/17/2016  . Chronic venous insufficiency 05/17/2016  . Degenerative  spondylolisthesis 03/21/2016  . De Quervain's disease (radial styloid tenosynovitis) 03/07/2015  . Ganglion cyst of dorsum of right wrist 03/07/2015  . Status post left partial knee replacement 04/07/2014  . Fibromyalgia 02/26/2013  . OA (osteoarthritis) 11/21/2012    Past Surgical History:  Procedure Laterality Date  . ABDOMINAL HYSTERECTOMY    . BACK SURGERY     x3 previous cervical fusion & then removed hardware   . BREAST SURGERY Right    benign-   . HAND TENDON SURGERY Right 2016  . JOINT REPLACEMENT Right    also had a partial on the L   . LUMBAR FUSION    . MYRINGOTOMY WITH TUBE PLACEMENT Right   . TONSILLECTOMY    . TUBAL LIGATION     had a 2nd. surgery for adhesions    Prior to Admission medications   Medication Sig Start Date End Date Taking? Authorizing Provider  cetirizine (ZYRTEC) 10 MG tablet Take 1 tablet (10 mg total) by mouth daily as needed for allergies. Patient taking differently: Take 10 mg by mouth daily.  03/01/17   Plonk, Gwyndolyn Saxon, MD  chlorthalidone (HYGROTON) 25 MG tablet Take 1 tablet (25 mg total) by mouth daily before breakfast. 09/25/16   Plonk, Gwyndolyn Saxon, MD  clindamycin (CLEOCIN) 150 MG capsule TAKE FOUR TABS PRIOR TO DENTAL APPOINMENT 03/07/16   [provider]  diazepam (VALIUM) 5 MG tablet Take 5 mg by mouth daily as needed for anxiety.    [provider]  escitalopram (LEXAPRO) 20 MG tablet Take 20 mg by mouth daily.  08/24/17   [provider]  fluticasone (FLONASE) 50 MCG/ACT nasal spray Place 2 sprays into both nostrils daily as needed for allergies.     [provider]  oxyCODONE (ROXICODONE) 5 MG immediate release tablet Take 1 tablet (5 mg total) by mouth every 8 (eight) hours as needed. 10/18/18 10/18/19  Montford Barg, Rulon Eisenmengerari B, FNP  rizatriptan (MAXALT) 10 MG tablet Take 10 mg by mouth as needed for migraine.     [provider]  rosuvastatin (CRESTOR) 5 MG tablet Take 5 mg by mouth daily. 11/19/17   [provider]  SUMAtriptan (IMITREX) 100 MG tablet Take 1 tablet (100 mg total) by mouth daily as needed for migraine. May repeat in 2 hours if headache persists or recurs. 11/23/16   Plonk, Chrissie NoaWilliam, MD    Allergies Gentamicin, Hydralazine, Ibandronic acid, and Penicillins  Family History  Problem Relation Age of Onset  . Aneurysm Mother   . Lung cancer Brother     Social History Social History   Tobacco Use  . Smoking status: Former Smoker    Types: Cigarettes    Quit date: 03/15/1976    Years since quitting: 42.6  . Smokeless tobacco: Never Used  Substance Use Topics  . Alcohol use: No  . Drug use: Yes    Frequency: 1.0 times per week    Types: Marijuana    Comment: occasionally     Review of Systems Constitutional: Negative for fever. Cardiovascular: Negative for chest pain. Respiratory: Negative for shortness of breath. Musculoskeletal: Positive for left shoulder pain. Skin: Negative for open wounds or lesions over the left shoulder. Neurological: Negative for decrease in sensation.  Awake, alert, oriented x4.  ____________________________________________   PHYSICAL EXAM:  VITAL SIGNS: ED Triage Vitals  Enc Vitals Group     BP 10/18/18 1357 (!) 129/41     Pulse Rate 10/18/18 1357 76     Resp 10/18/18 1357 18     Temp 10/18/18 1357 97.9 F (36.6 C)     Temp Source 10/18/18 1357 Oral     SpO2 10/18/18 1357 96 %     Weight 10/18/18 1358 177 lb (80.3 kg)     Height 10/18/18 1358 5\' 3"  (1.6 m)     Head Circumference --      Peak Flow --      Pain Score 10/18/18 1358 6     Pain Loc --      Pain Edu? --      Excl. in GC? --     Constitutional: Alert and oriented. Well appearing and in no acute distress. Eyes: Conjunctivae are clear without discharge or drainage Head: Atraumatic Neck: Supple.  No focal midline tenderness. Respiratory: No cough. Respirations are even and unlabored. Musculoskeletal: Tenderness is noted over the left shoulder and proximal  humerus.  No tenderness over the left elbow and patient demonstrates range of motion without complaint of pain.  No pain or tenderness in the left wrist or hand. Neurologic: Sensation is intact in the left upper extremity. Motor function is intact.  Skin: No open wounds or lesions noted over the left upper extremity. Psychiatric: Affect and behavior are appropriate.  ____________________________________________   LABS (all labs ordered are listed, but only abnormal results are displayed)  Labs Reviewed - No data to display ____________________________________________  RADIOLOGY  CT scan of the head is negative for acute findings per radiology.  Image of the left  shoulder shows a fracture of the proximal left humerus that is comminuted with some displacement of the left humeral neck.  No dislocation of the joint. ____________________________________________   PROCEDURES  Procedures  ____________________________________________   INITIAL IMPRESSION / ASSESSMENT AND PLAN / ED COURSE  Carol [name] is a 73 y.o. who presents to the emergency department for treatment and evaluation after mechanical, non-syncopal fall at home prior to arrival.  CT scan of the head is reassuring.  X-ray of the left shoulder confirms a humeral neck fracture.  While here, she was given morphine and Zofran and then placed in a sling for mobilization.  She is an established patient with Dr. Hyacinth MeekerMiller and would like to see him for follow-up of this injury.  She was advised that she will need to call him first thing Monday morning to request an appointment.   Patient was advised to return to the emergency department for any symptoms of concern if she is unable to see her primary care provider or the orthopedic specialist.   Medications  morphine 4 MG/ML injection 4 mg (has no administration in time range)  ondansetron (ZOFRAN) injection 4 mg (has no administration in time range)    Pertinent labs & imaging  results that were available during my care of the patient were reviewed by me and considered in my medical decision making (see chart for details).  _________________________________________   FINAL CLINICAL IMPRESSION(S) / ED DIAGNOSES  Final diagnoses:  Closed displaced fracture of surgical neck of left humerus, unspecified fracture morphology, initial encounter    ED Discharge Orders         Ordered    oxyCODONE (ROXICODONE) 5 MG immediate release tablet  Every 8 hours PRN     10/18/18 1559           If controlled substance prescribed during this visit, 12 month history viewed on the [facility] prior to issuing an initial prescription for Schedule II or III opiod.   Chinita Pesterriplett, Daniyal Tabor B, FNP 10/18/18 1559    Willy Eddyobinson, Patrick, MD 10/18/18 [id number]

## 2018-10-21 ENCOUNTER — Ambulatory Visit: Admission: RE | Admit: 2018-10-21 | Payer: PPO | Source: Home / Self Care | Admitting: Specialist

## 2018-10-21 ENCOUNTER — Encounter: Admission: RE | Payer: Self-pay | Source: Home / Self Care

## 2018-10-21 DIAGNOSIS — S42212A Unspecified displaced fracture of surgical neck of left humerus, initial encounter for closed fracture: Secondary | ICD-10-CM | POA: Diagnosis not present

## 2018-10-21 DIAGNOSIS — M25561 Pain in right knee: Secondary | ICD-10-CM | POA: Diagnosis not present

## 2018-10-21 DIAGNOSIS — S42209A Unspecified fracture of upper end of unspecified humerus, initial encounter for closed fracture: Secondary | ICD-10-CM | POA: Insufficient documentation

## 2018-10-21 DIAGNOSIS — M25562 Pain in left knee: Secondary | ICD-10-CM | POA: Diagnosis not present

## 2018-10-21 DIAGNOSIS — M25569 Pain in unspecified knee: Secondary | ICD-10-CM | POA: Diagnosis not present

## 2018-10-21 SURGERY — CARPAL TUNNEL RELEASE
Anesthesia: General | Site: Wrist | Laterality: Right

## 2018-11-04 DIAGNOSIS — S42202A Unspecified fracture of upper end of left humerus, initial encounter for closed fracture: Secondary | ICD-10-CM | POA: Diagnosis not present

## 2018-11-09 ENCOUNTER — Ambulatory Visit (INDEPENDENT_AMBULATORY_CARE_PROVIDER_SITE_OTHER): Payer: PPO

## 2018-11-09 ENCOUNTER — Other Ambulatory Visit: Payer: Self-pay

## 2018-11-09 ENCOUNTER — Ambulatory Visit
Admission: EM | Admit: 2018-11-09 | Discharge: 2018-11-09 | Disposition: A | Discharge status: Home / Self Care | Payer: PPO

## 2018-11-09 DIAGNOSIS — M25532 Pain in left wrist: Secondary | ICD-10-CM

## 2018-11-09 DIAGNOSIS — M79622 Pain in left upper arm: Secondary | ICD-10-CM

## 2018-11-09 DIAGNOSIS — S6992XA Unspecified injury of left wrist, hand and finger(s), initial encounter: Secondary | ICD-10-CM | POA: Diagnosis not present

## 2018-11-09 DIAGNOSIS — W010XXS Fall on same level from slipping, tripping and stumbling without subsequent striking against object, sequela: Secondary | ICD-10-CM | POA: Diagnosis not present

## 2018-11-09 DIAGNOSIS — M7989 Other specified soft tissue disorders: Secondary | ICD-10-CM | POA: Diagnosis not present

## 2018-11-09 DIAGNOSIS — Z8781 Personal history of (healed) traumatic fracture: Secondary | ICD-10-CM

## 2018-11-09 NOTE — Discharge Instructions (Addendum)
It was very nice seeing you today in clinic. Thank you for entrusting me with your care.   Continue your pain medications as already prescribed. May add anti-inflammatory medication (ibuprofen) as needed for pain. ICE and ELEVATE extremity. Wear immobilizer as prescribed by orthopedics.   Make arrangements to follow up with Dr. Sabra Heck on Monday. If your symptoms/condition worsens, please seek follow up care either here or in the ER. Please remember, our [place] providers are "right here with you" when you need [place].   Again, it was my pleasure to take care of you today. Thank you for choosing our clinic. I hope that you start to feel better quickly.   Honor Loh, MSN, APRN, FNP-C, CEN Advanced Practice Provider [facility]

## 2018-11-09 NOTE — ED Triage Notes (Signed)
Pt fell 8/3 and tripped over her diswasher and was told by [facility] she broke her left arm in two places near her shoulder. Was given a sling by [facility] and has been wearing it "but it gets hot" was seen by them on Monday and was told to do some exercise of the left arm and now having pain and swelling in her left hand and right ankle. Unsure what is going on, pt seems aggravated about this. Has a sling on from the house on her left arm but was used for her hand. Pt was given norco for pain and states it only lasts a few hours and then the pain returns. States her arm was not swollen when she went to [facility] on Monday. [facility]

## 2018-11-10 NOTE — ED Provider Notes (Signed)
[place], [place]   Name: Carol NimsJanet S Wymer DOB: [birth year] MRN: [id number] CSN: [id number] PCP: Myrene BuddyGauger, Sarah Kathryn, NP  Arrival date and time:  11/09/18 1202  Chief Complaint:  Fall   NOTE: Prior to seeing the patient today, I have reviewed the triage nursing documentation and vital signs. Clinical staff has updated patient's PMH/PSHx, current medication list, and drug allergies/intolerances to ensure comprehensive history available to assist in medical decision making.   History:   HPI: Carol [name] is a 73 y.o. female who presents today with complaints of pain and swelling in her LEFT wrist following a mechanical fall that occurred on 10/21/2018. Patient notes that she tripped over the door to her dishwasher, which in turn caused her to fall onto her LEFT arm. Patient presented to the emergency department at [facility] where radiographs revealed a comminuted displaced fracture of the LEFT humeral neck. Patient was placed in a sling and referred to orthopedics. She was given a supply of Roxicodone for PRN use at home. She was seen in consult by Dr. Deeann SaintHoward Miller at [facility] in [place]; last visit was on Monday (11/04/2018). Sling was discontinued and patient was placed in an immobilizer (sling and swathe). Pain control was changed to Norco, however patient states, "it only lasts a few hours".   Patient presents today with complains of pain and swelling in her LEFT wrist and bruising to her BILATERAL knees. She states, "I don't know what is going on. They didn't even xray my wrist and I fell on it". Patient has been seen in the ED and by orthopedics and has not complained of pain in her wrist until today. Patient noting that the symptoms in her wrist started after she started doing the exercises prescribed by orthopedics. Patient states, "I hang my arm and make circles. It hurts so bad to do it". Patient presents to clinic today with traditional sling in place citing that it is easier to  wear, and not as hot, as compared to the sling and swathe provided by orthopedics.  Past Medical History:  Diagnosis Date  . Anxiety   . Arthritis   . Depression   . Headache    migraine - last one 2016  . Hypertension    reason that she was given [place] & she remarks that her BP has been better she has been taking it.   Marland Kitchen. Positive skin test for tuberculosis    told that she is a carrier,     Past Surgical History:  Procedure Laterality Date  . ABDOMINAL HYSTERECTOMY    . BACK SURGERY     x3 previous cervical fusion & then removed hardware   . BREAST SURGERY Right    benign-   . HAND TENDON SURGERY Right 2016  . JOINT REPLACEMENT Right    also had a partial on the L   . LUMBAR FUSION    . MYRINGOTOMY WITH TUBE PLACEMENT Right   . TONSILLECTOMY    . TUBAL LIGATION     had a 2nd. surgery for adhesions    Family History  Problem Relation Age of Onset  . Aneurysm Mother   . Lung cancer Brother     Social History   Tobacco Use  . Smoking status: Former Smoker    Types: Cigarettes    Quit date: 03/15/1976    Years since quitting: 42.6  . Smokeless tobacco: Never Used  Substance Use Topics  . Alcohol use: No  . Drug use: Yes  Frequency: 1.0 times per week    Types: Marijuana    Comment: occasionally     Patient Active Problem List   Diagnosis Date Noted  . Leg pain, left 10/03/2017  . Obesity (BMI 30.0-34.9) 05/25/2017  . Acquired genu varum 11/30/2016  . Depression 08/25/2016  . Vitamin D deficiency 07/20/2016  . B12 deficiency 07/20/2016  . Gait instability 07/19/2016  . Hypertension 07/19/2016  . Hyperlipidemia 07/19/2016  . Osteopenia of spine 07/19/2016  . History of lumbar fusion 07/19/2016  . Hx of fusion of cervical spine 07/19/2016  . Migraine headache 07/19/2016  . Anxiety disorder 07/19/2016  . Allergic rhinitis 07/19/2016  . Constipation 07/19/2016  . Lung nodules 07/19/2016  . History of positive PPD 07/19/2016  . Patent pressure  equalization (PE) tube on right side 07/19/2016  . Varicose veins of both lower extremities with pain 05/17/2016  . Chronic venous insufficiency 05/17/2016  . Degenerative spondylolisthesis 03/21/2016  . De Quervain's disease (radial styloid tenosynovitis) 03/07/2015  . Ganglion cyst of dorsum of right wrist 03/07/2015  . Status post left partial knee replacement 04/07/2014  . Fibromyalgia 02/26/2013  . OA (osteoarthritis) 11/21/2012    Home Medications:    Current Meds  Medication Sig  . cetirizine (ZYRTEC) 10 MG tablet Take 1 tablet (10 mg total) by mouth daily as needed for allergies. (Patient taking differently: Take 10 mg by mouth daily. )  . chlorthalidone (HYGROTON) 25 MG tablet Take 1 tablet (25 mg total) by mouth daily before breakfast.  . diazepam (VALIUM) 5 MG tablet Take 5 mg by mouth daily as needed for anxiety.  Marland Kitchen. escitalopram (LEXAPRO) 20 MG tablet Take 20 mg by mouth daily.   . fluticasone (FLONASE) 50 MCG/ACT nasal spray Place 2 sprays into both nostrils daily as needed for allergies.   Marland Kitchen. HYDROcodone-acetaminophen (NORCO/VICODIN) 5-325 MG tablet Take 1 tablet by mouth every 6 (six) hours as needed for moderate pain.  Marland Kitchen. oxyCODONE (ROXICODONE) 5 MG immediate release tablet Take 1 tablet (5 mg total) by mouth every 8 (eight) hours as needed.  . rizatriptan (MAXALT) 10 MG tablet Take 10 mg by mouth as needed for migraine.   . SUMAtriptan (IMITREX) 100 MG tablet Take 1 tablet (100 mg total) by mouth daily as needed for migraine. May repeat in 2 hours if headache persists or recurs.    Allergies:   Gentamicin, Hydralazine, Ibandronic acid, and Penicillins  Review of Systems (ROS): Review of Systems  Constitutional: Negative for chills and fever.  Respiratory: Negative for cough and shortness of breath.   Cardiovascular: Negative for chest pain and palpitations.  Musculoskeletal:       LUE pain and swelling; known humerus fracture  Skin: Negative for color change, pallor  and rash.  Neurological: Negative for dizziness, syncope, weakness, numbness and headaches.  Psychiatric/Behavioral: The patient is nervous/anxious.   All other systems reviewed and are negative.    Vital Signs: Today's Vitals   11/09/18 1213 11/09/18 1218 11/09/18 1319  BP: (!) 146/67    Pulse: 78    Resp: 18    Temp: 97.9 F (36.6 C)    TempSrc: Oral    SpO2: 99%    Weight:  178 lb (80.7 kg)   PainSc:  8  0-No pain    Physical Exam: Physical Exam  Constitutional: She is oriented to person, place, and time and well-developed, well-nourished, and in no distress.  HENT:  Head: Normocephalic and atraumatic.  Mouth/Throat: Mucous membranes are normal.  Eyes: Pupils  are equal, round, and reactive to light. EOM are normal.  Cardiovascular: Normal rate, regular rhythm, normal heart sounds and intact distal pulses. Exam reveals no gallop and no friction rub.  No murmur heard. Pulmonary/Chest: Effort normal and breath sounds normal. No respiratory distress. She has no wheezes. She has no rales.  Musculoskeletal:     Left shoulder: She exhibits decreased range of motion, bony tenderness, swelling and pain (known proximal humerous fracture). She exhibits normal pulse.     Left wrist: She exhibits tenderness and swelling. She exhibits normal range of motion and no deformity.     Right knee: She exhibits ecchymosis. She exhibits normal range of motion, no swelling, no deformity and no erythema. No tenderness found.     Left knee: She exhibits ecchymosis. She exhibits normal range of motion, no swelling, no deformity and no erythema. No tenderness found.     Left hand: She exhibits swelling. She exhibits normal range of motion, no tenderness, no bony tenderness, normal two-point discrimination, normal capillary refill and no deformity. Normal sensation noted. Normal strength noted.     Comments: Simple extremity sling in place; not being worn correctly. (+) PMS noted in distal LUE. Extremity  warm and dry. Capillary refill normal.   Neurological: She is alert and oriented to person, place, and time. She has normal sensation and normal reflexes. Gait normal.  No distal paraesthesias in LUE.  Skin: Skin is warm and dry. No rash noted.  Psychiatric: Memory, affect and judgment normal. Her mood appears anxious.  Nursing note and vitals reviewed.   Urgent Care Treatments / Results:   LABS: PLEASE NOTE: all labs that were ordered this encounter are listed, however only abnormal results are displayed. Labs Reviewed - No data to display  EKG: -None  RADIOLOGY: Dg Wrist Complete Left  Result Date: 11/09/2018 CLINICAL DATA:  Pain and swelling after falling. Pain. LEFT wrist injury 10/21/2018 EXAM: LEFT WRIST - COMPLETE 3+ VIEW COMPARISON:  None FINDINGS: Mild degenerative changes at the first carpometacarpal joint. No acute fracture or subluxation. No radiopaque foreign body or soft tissue gas. IMPRESSION: No evidence for acute abnormality. Electronically Signed   By: Norva PavlovElizabeth  Brown M.D.   On: 11/09/2018 13:07   PROCEDURES: Procedures  MEDICATIONS RECEIVED THIS VISIT: Medications - No data to display  PERTINENT CLINICAL COURSE NOTES/UPDATES:   Initial Impression / Assessment and Plan / Urgent Care Course:  Pertinent labs & imaging results that were available during my care of the patient were personally reviewed by me and considered in my medical decision making (see lab/imaging section of note for values and interpretations).  Carol [name] is a 73 y.o. female who [facility] today with complaints of LEFT wrist pain and swelling following remote fall.   Patient is well appearing overall in clinic today. She does not appear to be in any acute distress. Presenting symptoms (see HPI) and exam as documented above. Diagnostic radiographs of the LEFT wrist are negative for acute fracture or dislocation. There is no concern for neurovascular compromise as her  extremity is warm, dry, and her pulses are strong distal to her known humeral fracture. Discussed that symptoms are likely directly related to her upper arm fracture. She presents in a simple sling, but has been prescribed an immobilizer. She is not wearing the sling properly and the extremity is basically observed in a dependant position. She wears the immobilizer at home "sometimes", but notes the sling alone is "easier to wear and  not as hot". Patient encouraged to following instructions as provided by orthopedic specialist. She was advised to continue exercises and to make efforts to move fingers/wrist often to help with swelling and resulting stiffness. Recommended ice to shoulder and wrist, as well as elevation to the extent that she can tolerate. Patient going extended times between her pain medications. She was advised to take as prescribed to help control her pain. May add IBU for severe pain and increased swelling not relieved by the prescribed opioid medication. Patient encouraged to call Dr. Ammie Ferrier office ([facility]) on Monday to discuss further.   I have reviewed the follow up and strict return precautions for any new or worsening symptoms. Patient is aware of symptoms that would be deemed urgent/emergent, and would thus require further evaluation either here or in the emergency department. At the time of discharge, she verbalized understanding and consent with the discharge plan as it was reviewed with her. All questions were fielded by provider and/or clinic staff prior to patient discharge.    Final Clinical Impressions / Urgent Care Diagnoses:   Final diagnoses:  Wrist pain, acute, left  Left upper arm pain  History of humerus fracture  Fall from slip, trip, or stumble, sequela    New Prescriptions:  [place] Controlled Substance Registry consulted? Not Applicable  No orders of the defined types were placed in this encounter.   Recommended Follow up Care:  Patient encouraged  to follow up with the following provider within the specified time frame, or sooner as dictated by the severity of her symptoms. As always, she was instructed that for any urgent/emergent care needs, she should seek care either here or in the emergency department for more immediate evaluation.  Follow-up Information    Call  Earnestine Leys, MD.   Specialty: Orthopedic Surgery Why: Call on Monday to discuss pain and swelling and need for further evaluation/management. Contact information: [place] [address] [phone]         NOTE: This note was prepared using Dragon dictation software along with smaller phrase technology. Despite my best ability to proofread, there is the potential that transcriptional errors may still occur from this process, and are completely unintentional.    Karen Kitchens, NP 11/10/18 1337

## 2018-11-11 DIAGNOSIS — S8011XA Contusion of right lower leg, initial encounter: Secondary | ICD-10-CM | POA: Diagnosis not present

## 2018-11-11 DIAGNOSIS — S8002XA Contusion of left knee, initial encounter: Secondary | ICD-10-CM | POA: Diagnosis not present

## 2018-11-11 DIAGNOSIS — Z96651 Presence of right artificial knee joint: Secondary | ICD-10-CM | POA: Diagnosis not present

## 2018-11-11 DIAGNOSIS — M25562 Pain in left knee: Secondary | ICD-10-CM | POA: Diagnosis not present

## 2018-11-11 DIAGNOSIS — S8001XA Contusion of right knee, initial encounter: Secondary | ICD-10-CM | POA: Diagnosis not present

## 2018-11-11 DIAGNOSIS — G8929 Other chronic pain: Secondary | ICD-10-CM | POA: Diagnosis not present

## 2018-11-11 DIAGNOSIS — Z96652 Presence of left artificial knee joint: Secondary | ICD-10-CM | POA: Diagnosis not present

## 2018-11-11 DIAGNOSIS — S8012XA Contusion of left lower leg, initial encounter: Secondary | ICD-10-CM | POA: Diagnosis not present

## 2018-11-11 DIAGNOSIS — W010XXA Fall on same level from slipping, tripping and stumbling without subsequent striking against object, initial encounter: Secondary | ICD-10-CM | POA: Diagnosis not present

## 2018-11-14 DIAGNOSIS — Z6832 Body mass index (BMI) 32.0-32.9, adult: Secondary | ICD-10-CM | POA: Diagnosis not present

## 2018-11-14 DIAGNOSIS — I10 Essential (primary) hypertension: Secondary | ICD-10-CM | POA: Diagnosis not present

## 2018-11-14 DIAGNOSIS — M5412 Radiculopathy, cervical region: Secondary | ICD-10-CM | POA: Diagnosis not present

## 2018-12-05 DIAGNOSIS — S42202A Unspecified fracture of upper end of left humerus, initial encounter for closed fracture: Secondary | ICD-10-CM | POA: Diagnosis not present

## 2018-12-11 DIAGNOSIS — M25512 Pain in left shoulder: Secondary | ICD-10-CM | POA: Diagnosis not present

## 2018-12-11 DIAGNOSIS — M25612 Stiffness of left shoulder, not elsewhere classified: Secondary | ICD-10-CM | POA: Diagnosis not present

## 2018-12-25 DIAGNOSIS — M461 Sacroiliitis, not elsewhere classified: Secondary | ICD-10-CM | POA: Diagnosis not present

## 2018-12-25 DIAGNOSIS — I10 Essential (primary) hypertension: Secondary | ICD-10-CM | POA: Diagnosis not present

## 2018-12-25 DIAGNOSIS — Z6830 Body mass index (BMI) 30.0-30.9, adult: Secondary | ICD-10-CM | POA: Diagnosis not present

## 2018-12-30 DIAGNOSIS — M25532 Pain in left wrist: Secondary | ICD-10-CM | POA: Diagnosis not present

## 2018-12-30 DIAGNOSIS — S82832A Other fracture of upper and lower end of left fibula, initial encounter for closed fracture: Secondary | ICD-10-CM | POA: Diagnosis not present

## 2019-01-09 DIAGNOSIS — S82402A Unspecified fracture of shaft of left fibula, initial encounter for closed fracture: Secondary | ICD-10-CM | POA: Diagnosis not present

## 2019-01-09 DIAGNOSIS — S82832A Other fracture of upper and lower end of left fibula, initial encounter for closed fracture: Secondary | ICD-10-CM | POA: Diagnosis not present

## 2019-01-12 DIAGNOSIS — S82409A Unspecified fracture of shaft of unspecified fibula, initial encounter for closed fracture: Secondary | ICD-10-CM | POA: Insufficient documentation

## 2019-01-17 DIAGNOSIS — F329 Major depressive disorder, single episode, unspecified: Secondary | ICD-10-CM | POA: Diagnosis not present

## 2019-01-17 DIAGNOSIS — T07XXXA Unspecified multiple injuries, initial encounter: Secondary | ICD-10-CM | POA: Diagnosis not present

## 2019-01-17 DIAGNOSIS — E669 Obesity, unspecified: Secondary | ICD-10-CM | POA: Diagnosis not present

## 2019-01-17 DIAGNOSIS — I10 Essential (primary) hypertension: Secondary | ICD-10-CM | POA: Diagnosis not present

## 2019-01-17 DIAGNOSIS — F419 Anxiety disorder, unspecified: Secondary | ICD-10-CM | POA: Diagnosis not present

## 2019-01-17 DIAGNOSIS — J449 Chronic obstructive pulmonary disease, unspecified: Secondary | ICD-10-CM | POA: Diagnosis not present

## 2019-01-17 DIAGNOSIS — Z780 Asymptomatic menopausal state: Secondary | ICD-10-CM | POA: Diagnosis not present

## 2019-01-17 DIAGNOSIS — E876 Hypokalemia: Secondary | ICD-10-CM | POA: Diagnosis not present

## 2019-01-17 DIAGNOSIS — E782 Mixed hyperlipidemia: Secondary | ICD-10-CM | POA: Diagnosis not present

## 2019-01-17 DIAGNOSIS — E871 Hypo-osmolality and hyponatremia: Secondary | ICD-10-CM | POA: Diagnosis not present

## 2019-01-17 DIAGNOSIS — Z79899 Other long term (current) drug therapy: Secondary | ICD-10-CM | POA: Diagnosis not present

## 2019-01-27 ENCOUNTER — Ambulatory Visit: Payer: PPO | Admitting: *Deleted

## 2019-02-03 DIAGNOSIS — E876 Hypokalemia: Secondary | ICD-10-CM | POA: Diagnosis not present

## 2019-02-03 DIAGNOSIS — E871 Hypo-osmolality and hyponatremia: Secondary | ICD-10-CM | POA: Diagnosis not present

## 2019-02-04 DIAGNOSIS — M8588 Other specified disorders of bone density and structure, other site: Secondary | ICD-10-CM | POA: Diagnosis not present

## 2019-02-06 DIAGNOSIS — S42202A Unspecified fracture of upper end of left humerus, initial encounter for closed fracture: Secondary | ICD-10-CM | POA: Diagnosis not present

## 2019-02-06 DIAGNOSIS — S82402A Unspecified fracture of shaft of left fibula, initial encounter for closed fracture: Secondary | ICD-10-CM | POA: Diagnosis not present

## 2019-03-06 DIAGNOSIS — S82402A Unspecified fracture of shaft of left fibula, initial encounter for closed fracture: Secondary | ICD-10-CM | POA: Diagnosis not present

## 2019-03-21 HISTORY — PX: CHOLECYSTECTOMY: SHX55

## 2019-06-09 DIAGNOSIS — R05 Cough: Secondary | ICD-10-CM | POA: Diagnosis not present

## 2019-06-09 DIAGNOSIS — J302 Other seasonal allergic rhinitis: Secondary | ICD-10-CM | POA: Diagnosis not present

## 2019-06-09 DIAGNOSIS — F329 Major depressive disorder, single episode, unspecified: Secondary | ICD-10-CM | POA: Diagnosis not present

## 2019-06-09 DIAGNOSIS — F419 Anxiety disorder, unspecified: Secondary | ICD-10-CM | POA: Diagnosis not present

## 2019-06-17 DIAGNOSIS — G8929 Other chronic pain: Secondary | ICD-10-CM | POA: Diagnosis not present

## 2019-06-17 DIAGNOSIS — M545 Low back pain: Secondary | ICD-10-CM | POA: Diagnosis not present

## 2019-07-08 DIAGNOSIS — F329 Major depressive disorder, single episode, unspecified: Secondary | ICD-10-CM | POA: Diagnosis not present

## 2019-07-08 DIAGNOSIS — F419 Anxiety disorder, unspecified: Secondary | ICD-10-CM | POA: Diagnosis not present

## 2019-07-08 DIAGNOSIS — Z79899 Other long term (current) drug therapy: Secondary | ICD-10-CM | POA: Diagnosis not present

## 2019-07-15 DIAGNOSIS — M461 Sacroiliitis, not elsewhere classified: Secondary | ICD-10-CM | POA: Diagnosis not present

## 2019-07-15 DIAGNOSIS — I10 Essential (primary) hypertension: Secondary | ICD-10-CM | POA: Diagnosis not present

## 2019-07-15 DIAGNOSIS — Z6832 Body mass index (BMI) 32.0-32.9, adult: Secondary | ICD-10-CM | POA: Diagnosis not present

## 2019-07-17 DIAGNOSIS — S82832A Other fracture of upper and lower end of left fibula, initial encounter for closed fracture: Secondary | ICD-10-CM | POA: Diagnosis not present

## 2019-07-21 ENCOUNTER — Other Ambulatory Visit: Payer: Self-pay | Admitting: Nurse Practitioner

## 2019-07-21 DIAGNOSIS — Z79899 Other long term (current) drug therapy: Secondary | ICD-10-CM | POA: Diagnosis not present

## 2019-07-21 DIAGNOSIS — Z1231 Encounter for screening mammogram for malignant neoplasm of breast: Secondary | ICD-10-CM | POA: Diagnosis not present

## 2019-07-21 DIAGNOSIS — Z0000 Encounter for general adult medical examination without abnormal findings: Secondary | ICD-10-CM | POA: Diagnosis not present

## 2019-07-21 DIAGNOSIS — E669 Obesity, unspecified: Secondary | ICD-10-CM | POA: Diagnosis not present

## 2019-07-21 DIAGNOSIS — F419 Anxiety disorder, unspecified: Secondary | ICD-10-CM | POA: Diagnosis not present

## 2019-07-21 DIAGNOSIS — I10 Essential (primary) hypertension: Secondary | ICD-10-CM | POA: Diagnosis not present

## 2019-07-21 DIAGNOSIS — M8580 Other specified disorders of bone density and structure, unspecified site: Secondary | ICD-10-CM | POA: Diagnosis not present

## 2019-07-21 DIAGNOSIS — F329 Major depressive disorder, single episode, unspecified: Secondary | ICD-10-CM | POA: Diagnosis not present

## 2019-07-21 DIAGNOSIS — M8949 Other hypertrophic osteoarthropathy, multiple sites: Secondary | ICD-10-CM | POA: Diagnosis not present

## 2019-07-21 DIAGNOSIS — E782 Mixed hyperlipidemia: Secondary | ICD-10-CM | POA: Diagnosis not present

## 2019-07-21 DIAGNOSIS — E559 Vitamin D deficiency, unspecified: Secondary | ICD-10-CM | POA: Diagnosis not present

## 2019-07-21 DIAGNOSIS — J449 Chronic obstructive pulmonary disease, unspecified: Secondary | ICD-10-CM | POA: Diagnosis not present

## 2019-07-25 DIAGNOSIS — M810 Age-related osteoporosis without current pathological fracture: Secondary | ICD-10-CM | POA: Diagnosis not present

## 2019-07-28 ENCOUNTER — Ambulatory Visit: Payer: PPO | Admitting: Pain Medicine

## 2019-08-11 ENCOUNTER — Other Ambulatory Visit: Payer: Self-pay | Admitting: Orthopedic Surgery

## 2019-08-11 DIAGNOSIS — M25572 Pain in left ankle and joints of left foot: Secondary | ICD-10-CM | POA: Diagnosis not present

## 2019-08-11 DIAGNOSIS — G8929 Other chronic pain: Secondary | ICD-10-CM

## 2019-08-11 DIAGNOSIS — M19172 Post-traumatic osteoarthritis, left ankle and foot: Secondary | ICD-10-CM | POA: Diagnosis not present

## 2019-08-22 DIAGNOSIS — I10 Essential (primary) hypertension: Secondary | ICD-10-CM | POA: Diagnosis not present

## 2019-08-22 DIAGNOSIS — Z79899 Other long term (current) drug therapy: Secondary | ICD-10-CM | POA: Diagnosis not present

## 2019-08-22 DIAGNOSIS — F329 Major depressive disorder, single episode, unspecified: Secondary | ICD-10-CM | POA: Diagnosis not present

## 2019-08-22 DIAGNOSIS — F419 Anxiety disorder, unspecified: Secondary | ICD-10-CM | POA: Diagnosis not present

## 2019-08-25 ENCOUNTER — Encounter: Payer: Self-pay | Admitting: Student in an Organized Health Care Education/Training Program

## 2019-08-25 ENCOUNTER — Ambulatory Visit
Payer: PPO | Attending: Student in an Organized Health Care Education/Training Program | Admitting: Student in an Organized Health Care Education/Training Program

## 2019-08-25 ENCOUNTER — Other Ambulatory Visit: Payer: Self-pay

## 2019-08-25 VITALS — BP 134/64 | HR 80 | Temp 97.9°F | Resp 16 | Ht 63.0 in | Wt 178.0 lb

## 2019-08-25 DIAGNOSIS — G8929 Other chronic pain: Secondary | ICD-10-CM | POA: Diagnosis not present

## 2019-08-25 DIAGNOSIS — M47818 Spondylosis without myelopathy or radiculopathy, sacral and sacrococcygeal region: Secondary | ICD-10-CM | POA: Insufficient documentation

## 2019-08-25 DIAGNOSIS — M47816 Spondylosis without myelopathy or radiculopathy, lumbar region: Secondary | ICD-10-CM | POA: Insufficient documentation

## 2019-08-25 DIAGNOSIS — M533 Sacrococcygeal disorders, not elsewhere classified: Secondary | ICD-10-CM | POA: Insufficient documentation

## 2019-08-25 DIAGNOSIS — G894 Chronic pain syndrome: Secondary | ICD-10-CM | POA: Diagnosis not present

## 2019-08-25 DIAGNOSIS — M5416 Radiculopathy, lumbar region: Secondary | ICD-10-CM | POA: Insufficient documentation

## 2019-08-25 NOTE — Progress Notes (Signed)
Safety precautions to be maintained throughout the outpatient stay will include: orient to surroundings, keep bed in low position, maintain call bell within reach at all times, provide assistance with transfer out of bed and ambulation.  

## 2019-08-25 NOTE — Patient Instructions (Signed)
____________________________________________________________________________________________  General Risks and Possible Complications  Patient Responsibilities: It is important that you read this as it is part of your informed consent. It is our duty to inform you of the risks and possible complications associated with treatments offered to you. It is your responsibility as a patient to read this and to ask questions about anything that is not clear or that you believe was not covered in this document.  Patient's Rights: You have the right to refuse treatment. You also have the right to change your mind, even after initially having agreed to have the treatment done. However, under this last option, if you wait until the last second to change your mind, you may be charged for the materials used up to that point.  Introduction: Medicine is not an exact science. Everything in Medicine, including the lack of treatment(s), carries the potential for danger, harm, or loss (which is by definition: Risk). In Medicine, a complication is a secondary problem, condition, or disease that can aggravate an already existing one. All treatments carry the risk of possible complications. The fact that a side effects or complications occurs, does not imply that the treatment was conducted incorrectly. It must be clearly understood that these can happen even when everything is done following the highest safety standards.  No treatment: You can choose not to proceed with the proposed treatment alternative. The "PRO(s)" would include: avoiding the risk of complications associated with the therapy. The "CON(s)" would include: not getting any of the treatment benefits. These benefits fall under one of three categories: diagnostic; therapeutic; and/or palliative. Diagnostic benefits include: getting information which can ultimately lead to improvement of the disease or symptom(s). Therapeutic benefits are those associated with the  successful treatment of the disease. Finally, palliative benefits are those related to the decrease of the primary symptoms, without necessarily curing the condition (example: decreasing the pain from a flare-up of a chronic condition, such as incurable terminal cancer).  General Risks and Complications: These are associated to most interventional treatments. They can occur alone, or in combination. They fall under one of the following six (6) categories: no benefit or worsening of symptoms; bleeding; infection; nerve damage; allergic reactions; and/or death. 1. No benefits or worsening of symptoms: In Medicine there are no guarantees, only probabilities. No healthcare provider can ever guarantee that a medical treatment will work, they can only state the probability that it may. Furthermore, there is always the possibility that the condition may worsen, either directly, or indirectly, as a consequence of the treatment. 2. Bleeding: This is more common if the patient is taking a blood thinner, either prescription or over the counter (example: Goody Powders, Fish oil, Aspirin, Garlic, etc.), or if suffering a condition associated with impaired coagulation (example: Hemophilia, cirrhosis of the liver, low platelet counts, etc.). However, even if you do not have one on these, it can still happen. If you have any of these conditions, or take one of these drugs, make sure to notify your treating physician. 3. Infection: This is more common in patients with a compromised immune system, either due to disease (example: diabetes, cancer, human immunodeficiency virus [HIV], etc.), or due to medications or treatments (example: therapies used to treat cancer and rheumatological diseases). However, even if you do not have one on these, it can still happen. If you have any of these conditions, or take one of these drugs, make sure to notify your treating physician. 4. Nerve Damage: This is more common when the   treatment is  an invasive one, but it can also happen with the use of medications, such as those used in the treatment of cancer. The damage can occur to small secondary nerves, or to large primary ones, such as those in the spinal cord and brain. This damage may be temporary or permanent and it may lead to impairments that can range from temporary numbness to permanent paralysis and/or brain death. 5. Allergic Reactions: Any time a substance or material comes in contact with our body, there is the possibility of an allergic reaction. These can range from a mild skin rash (contact dermatitis) to a severe systemic reaction (anaphylactic reaction), which can result in death. 6. Death: In general, any medical intervention can result in death, most of the time due to an unforeseen complication. ____________________________________________________________________________________________  ____________________________________________________________________________________________  Preparing for your procedure (without sedation)  Procedure appointments are limited to planned procedures: . No Prescription Refills. . No disability issues will be discussed. . No medication changes will be discussed.  Instructions: . Oral Intake: Do not eat or drink anything for at least 6 hours prior to your procedure. (Exception: Blood Pressure Medication. See below.) . Transportation: Unless otherwise stated by your physician, you may drive yourself after the procedure. . Blood Pressure Medicine: Do not forget to take your blood pressure medicine with a sip of water the morning of the procedure. If your Diastolic (lower reading)is above 100 mmHg, elective cases will be cancelled/rescheduled. . Blood thinners: These will need to be stopped for procedures. Notify our staff if you are taking any blood thinners. Depending on which one you take, there will be specific instructions on how and when to stop it. . Diabetics on insulin: Notify  the staff so that you can be scheduled 1st case in the morning. If your diabetes requires high dose insulin, take only  of your normal insulin dose the morning of the procedure and notify the staff that you have done so. . Preventing infections: Shower with an antibacterial soap the morning of your procedure.  . Build-up your immune system: Take 1000 mg of Vitamin C with every meal (3 times a day) the day prior to your procedure. . Antibiotics: Inform the staff if you have a condition or reason that requires you to take antibiotics before dental procedures. . Pregnancy: If you are pregnant, call and cancel the procedure. . Sickness: If you have a cold, fever, or any active infections, call and cancel the procedure. . Arrival: You must be in the facility at least 30 minutes prior to your scheduled procedure. . Children: Do not bring any children with you. . Dress appropriately: Bring dark clothing that you would not mind if they get stained. . Valuables: Do not bring any jewelry or valuables.  Reasons to call and reschedule or cancel your procedure: (Following these recommendations will minimize the risk of a serious complication.) . Surgeries: Avoid having procedures within 2 weeks of any surgery. (Avoid for 2 weeks before or after any surgery). . Flu Shots: Avoid having procedures within 2 weeks of a flu shots or . (Avoid for 2 weeks before or after immunizations). . Barium: Avoid having a procedure within 7-10 days after having had a radiological study involving the use of radiological contrast. (Myelograms, Barium swallow or enema study). . Heart attacks: Avoid any elective procedures or surgeries for the initial 6 months after a "Myocardial Infarction" (Heart Attack). . Blood thinners: It is imperative that you stop these medications before procedures. Let us   know if you if you take any blood thinner.  . Infection: Avoid procedures during or within two weeks of an infection (including chest  colds or gastrointestinal problems). Symptoms associated with infections include: Localized redness, fever, chills, night sweats or profuse sweating, burning sensation when voiding, cough, congestion, stuffiness, runny nose, sore throat, diarrhea, nausea, vomiting, cold or Flu symptoms, recent or current infections. It is specially important if the infection is over the area that we intend to treat. Marland Kitchen Heart and lung problems: Symptoms that may suggest an active cardiopulmonary problem include: cough, chest pain, breathing difficulties or shortness of breath, dizziness, ankle swelling, uncontrolled high or unusually low blood pressure, and/or palpitations. If you are experiencing any of these symptoms, cancel your procedure and contact your primary care physician for an evaluation.  Remember:  Regular Business hours are:  Monday to Thursday 8:00 AM to 4:00 PM  Provider's Schedule: Delano Metz, MD:  Procedure days: Tuesday and Thursday 7:30 AM to 4:00 PM  Edward Jolly, MD:  Procedure days: Monday and Wednesday 7:30 AM to 4:00 PM ____________________________________________________________________________________________  ____________________________________________________________________________________________   Infection: Avoid procedures during or within two weeks of an  Epidural Steroid Injection Patient Information  Description: The epidural space surrounds the nerves as they exit the spinal cord.  In some patients, the nerves can be compressed and inflamed by a bulging disc or a tight spinal canal (spinal stenosis).  By injecting steroids into the epidural space, we can bring irritated nerves into direct contact with a potentially helpful medication.  These steroids act directly on the irritated nerves and can reduce swelling and inflammation which often leads to decreased pain.  Epidural steroids may be injected anywhere along the spine and from the neck to the low back depending  upon the location of your pain.   After numbing the skin with local anesthetic (like Novocaine), a small needle is passed into the epidural space slowly.  You may experience a sensation of pressure while this is being done.  The entire block usually last less than 10 minutes.  Conditions which may be treated by epidural steroids:   Low back and leg pain  Neck and arm pain  Spinal stenosis  Post-laminectomy syndrome  Herpes zoster (shingles) pain  Pain from compression fractures  Preparation for the injection:  1. Do not eat any solid food or dairy products within 8 hours of your appointment.  2. You may drink clear liquids up to 3 hours before appointment.  Clear liquids include water, black coffee, juice or soda.  No milk or cream please. 3. You may take your regular medication, including pain medications, with a sip of water before your appointment  Diabetics should hold regular insulin (if taken separately) and take 1/2 normal NPH dos the morning of the procedure.  Carry some sugar containing items with you to your appointment. 4. A driver must accompany you and be prepared to drive you home after your procedure.  5. Bring all your current medications with your. 6. An IV may be inserted and sedation may be given at the discretion of the physician.   7. A blood pressure cuff, EKG and other monitors will often be applied during the procedure.  Some patients may need to have extra oxygen administered for a short period. 8. You will be asked to provide medical information, including your allergies, prior to the procedure.  We must know immediately if you are taking blood thinners (like Coumadin/Warfarin)  Or if you are allergic to  IV iodine contrast (dye). We must know if you could possible be pregnant.  Possible side-effects:  Bleeding from needle site  Infection (rare, may require surgery)  Nerve injury (rare)  Numbness & tingling (temporary)  Difficulty urinating (rare,  temporary)  Spinal headache ( a headache worse with upright posture)  Light -headedness (temporary)  Pain at injection site (several days)  Decreased blood pressure (temporary)  Weakness in arm/leg (temporary)  Pressure sensation in back/neck (temporary)  Call if you experience:  Fever/chills associated with headache or increased back/neck pain.  Headache worsened by an upright position.  New onset weakness or numbness of an extremity below the injection site  Hives or difficulty breathing (go to the emergency room)  Inflammation or drainage at the infection site  Severe back/neck pain  Any new symptoms which are concerning to you  Please note:  Although the local anesthetic injected can often make your back or neck feel good for several hours after the injection, the pain will likely return.  It takes 3-7 days for steroids to work in the epidural space.  You may not notice any pain relief for at least that one week.  If effective, we will often do a series of three injections spaced 3-6 weeks apart to maximally decrease your pain.  After the initial series, we generally will wait several months before considering a repeat injection of the same type.  If you have any questions, please call [phone] [facility]

## 2019-08-25 NOTE — Progress Notes (Signed)
Patient: Carol [name]  Service Category: E/M  Provider: Gillis Santa, MD  DOB: Jan 02, 1946  DOS: 08/25/2019  Referring Provider: Watt Climes, PA  MRN: [id number]  Setting: Ambulatory outpatient  PCP: Sallee Lange, NP  Type: New Patient  Specialty: Interventional Pain Management    Location: Office  Delivery: Face-to-face     Primary Reason(s) for Visit: Encounter for initial evaluation of one or more chronic problems (new to examiner) potentially causing chronic pain, and posing a threat to normal musculoskeletal function. (Level of risk: High) CC: Back Pain (lumbar bilateral), Shoulder Pain (left ), Ankle Pain (left s/p fx 10/20), and Hand Pain (bilateral )  HPI  Carol [name] is a 74 y.o. year old, female patient, who comes today to see [place] for the first time for an initial evaluation of her chronic pain. She has Degenerative spondylolisthesis; Varicose veins of both lower extremities with pain; Chronic venous insufficiency; De Quervain's disease (radial styloid tenosynovitis); Fibromyalgia; Ganglion cyst of dorsum of right wrist; OA (osteoarthritis); Status post left partial knee replacement; Gait instability; Hypertension; Hyperlipidemia; Osteopenia of spine; History of lumbar fusion; Hx of fusion of cervical spine; Migraine headache; Anxiety disorder; Allergic rhinitis; Constipation; Lung nodules; History of positive PPD; Patent pressure equalization (PE) tube on right side; Vitamin D deficiency; B12 deficiency; Depression; Acquired genu varum; Obesity (BMI 30.0-34.9); and Leg pain, left on their problem list. Today she comes in for evaluation of her Back Pain (lumbar bilateral), Shoulder Pain (left ), Ankle Pain (left s/p fx 10/20), and Hand Pain (bilateral )  Pain Assessment: Location: Lower, Left, Right Back(see visit info for additional sites.) Radiating: sometimes into hips and legs Onset: More than a month ago Duration: Chronic pain Quality: Discomfort, Burning, Throbbing,  Constant Severity: 4 /10 (subjective, self-reported pain score)  Note: Reported level is compatible with observation.                         When using our objective Pain Scale, levels between 6 and 10/10 are said to belong in an emergency room, as it progressively worsens from a 6/10, described as severely limiting, requiring emergency care not usually available at an outpatient pain management facility. At a 6/10 level, communication becomes difficult and requires great effort. Assistance to reach the emergency department may be required. Facial flushing and profuse sweating along with potentially dangerous increases in heart rate and blood pressure will be evident. Effect on ADL: standing for any length of time is unbearable. Timing: Constant Modifying factors: sitting down, elevating feet..  heating pads, topical analgesic BP: 134/64   HR: 80  Onset and Duration: Date of onset: 46 years Cause of pain: Unknown Severity: Getting worse, NAS-11 at its best: 3/10, NAS-11 now: 6/10 and NAS-11 on the average: 5/10 Timing: Not influenced by the time of the day Aggravating Factors: Bending, Kneeling, Lifiting, Prolonged standing, Walking uphill and Walking downhill Alleviating Factors: Lying down, Resting, Sitting and Using a brace Associated Problems: Sweating and Pain that wakes patient up Quality of Pain: Aching, Annoying, Burning, Constant, Distressing, Throbbing and Uncomfortable Previous Examinations or Tests: Bone scan, CT scan, MRI scan, X-rays, Neurological evaluation, Neurosurgical evaluation and Orthopedic evaluation Previous Treatments: Epidural steroid injections  The patient comes into the clinics today for the first time for a chronic pain management evaluation.    Carol [name] is a very pleasant 74 year old female who presents with multiple pain complaints however her chief complaint that she would like to discuss with me  is her chronic low back pain.  This is been present for many years.   She does have occasional radiation to bilateral lower extremity but this is not frequent.  She is status post L2-L5 lumbar spinal fusion in 2018 by Dr. Trenton Gammon.  She is also status post cervical fusion and his most recent C6-C7 fusion.  She endorses a hot sensation in her lateral thigh that is associated with certain positioning.  She does utilize marijuana for pain relief.  She is being referred here from [place] to consider spinal cord stimulator trial.  She was previously being seen at a pain clinic in [place].  She states that her last spinal injections were in April which provided approximately 10 days of pain relief.  It will be helpful to obtain these records.  Patient denies any bowel or bladder weakness.  She is having significant left ankle pain which is being worked up by orthopedics.  Surgical plan for left ankle is to be determined.  She also has bilateral knee replacements for knee osteoarthritis.  Patient also has a history of osteoporosis and most recently showed osteopenia on her 2020 DEXA exam.  She has tried various opioid analgesics (hydrocodone, oxycodone, tramadol) in the past which were not beneficial.  She is currently not on any opioid analgesics.  Given that she does utilize marijuana on a regular basis, we will focus primarily on interventional pain therapies.  Medication management per PCP.  PMP reviewed   Meds   Current Outpatient Medications:    alendronate (FOSAMAX) 70 MG tablet, Take 70 mg by mouth daily as needed., Disp: , Rfl:    azelastine (ASTELIN) 0.1 % nasal spray, Place 1 spray into the nose as needed., Disp: , Rfl:    cetirizine (ZYRTEC) 10 MG tablet, Take 1 tablet (10 mg total) by mouth daily as needed for allergies. (Patient taking differently: Take 10 mg by mouth daily. ), Disp: 90 tablet, Rfl: 3   chlorthalidone (HYGROTON) 25 MG tablet, Take 1 tablet (25 mg total) by mouth daily before breakfast., Disp: 90 tablet, Rfl: 3   clindamycin (CLEOCIN) 150  MG capsule, TAKE FOUR TABS PRIOR TO DENTAL APPOINMENT, Disp: , Rfl:    clonazePAM (KLONOPIN) 0.5 MG tablet, Take 10 mg by mouth as needed., Disp: , Rfl:    diazepam (VALIUM) 5 MG tablet, Take 5 mg by mouth daily as needed for anxiety., Disp: , Rfl:    diclofenac Sodium (VOLTAREN) 1 % GEL, Apply 1 application topically as needed., Disp: , Rfl:    Docusate Sodium 100 MG/5ML ENEM, Take 1 tablet by mouth daily as needed., Disp: , Rfl:    FLUoxetine (PROZAC) 20 MG capsule, Take 20 mg by mouth daily., Disp: , Rfl:    fluticasone (FLONASE) 50 MCG/ACT nasal spray, Place 2 sprays into both nostrils daily as needed for allergies. , Disp: , Rfl:    ipratropium (ATROVENT) 0.03 % nasal spray, Place 1 spray into the nose as needed., Disp: , Rfl:    meloxicam (MOBIC) 15 MG tablet, Take 15 mg by mouth daily., Disp: , Rfl:    potassium chloride (KLOR-CON) 10 MEQ tablet, Take 10 mEq by mouth in the morning and at bedtime., Disp: , Rfl:    Psyllium-Calcium (FIBER PLUS CALCIUM PO), Take 1 tablet by mouth in the morning and at bedtime., Disp: , Rfl:    pyridOXINE (VITAMIN B-6) 100 MG tablet, Take 100 mg by mouth daily., Disp: , Rfl:    rizatriptan (MAXALT) 10 MG  tablet, Take 10 mg by mouth as needed for migraine. , Disp: , Rfl:    rosuvastatin (CRESTOR) 5 MG tablet, Take 5 mg by mouth daily., Disp: , Rfl: 1   SUMAtriptan (IMITREX) 100 MG tablet, Take 1 tablet (100 mg total) by mouth daily as needed for migraine. May repeat in 2 hours if headache persists or recurs., Disp: 10 tablet, Rfl: 2   traZODone (DESYREL) 50 MG tablet, Take 50 mg by mouth at bedtime., Disp: , Rfl:    Vitamin D, Cholecalciferol, 50 MCG (2000 UT) CAPS, Take 1 tablet by mouth daily., Disp: , Rfl:    escitalopram (LEXAPRO) 20 MG tablet, Take 20 mg by mouth daily. , Disp: , Rfl:    HYDROcodone-acetaminophen (NORCO/VICODIN) 5-325 MG tablet, Take 1 tablet by mouth every 6 (six) hours as needed for moderate pain., Disp: , Rfl:     oxyCODONE (ROXICODONE) 5 MG immediate release tablet, Take 1 tablet (5 mg total) by mouth every 8 (eight) hours as needed. (Patient not taking: Reported on 08/25/2019), Disp: 20 tablet, Rfl: 0  Imaging Review  Cervical Imaging:  Results for orders placed during the hospital encounter of 09/02/18  MR CERVICAL SPINE W WO CONTRAST   Narrative CLINICAL DATA:  74 y/o F; right hand numbness and tingling extending to the right neck and right shoulder with neck pain. Symptoms 5-6 weeks.  EXAM: MRI CERVICAL SPINE WITHOUT AND WITH CONTRAST  TECHNIQUE: Multiplanar and multiecho pulse sequences of the cervical spine, to include the craniocervical junction and cervicothoracic junction, were obtained without and with intravenous contrast.  CONTRAST:  7.5 cc Gadavist  COMPARISON:  11/05/2015 cervical spine MRI.  FINDINGS: Alignment: Straightening of cervical lordosis. C5-6 slight retrolisthesis as well as C7-T1 and T1-2 slight grade 1 anterolisthesis.  Vertebrae: Interval C6-7 anterior discectomy and fusion. Susceptibility artifact from fusion hardware partially obscures the vertebral bodies at those levels. Removal of prior C5-6 anterior cervical fusion hardware. C4-5 solid interbody fusion. No findings of fracture or discitis. No suspicious osseous lesion. After administration of intravenous contrast there is no abnormal enhancement.  Cord: No abnormal cord signal. After administration of intravenous contrast there is no abnormal enhancement.  Posterior Fossa, vertebral arteries, paraspinal tissues: Negative.  Disc levels:  C2-3: No significant disc displacement, foraminal stenosis, or canal stenosis. Mild facet hypertrophy.  C3-4: Disc osteophyte complex with right greater than left uncovertebral and facet hypertrophy. Mild-to-moderate right and mild left neural foraminal stenosis. Mild spinal canal stenosis.  C4-5: Chronic fusion. Stable mild ossific ridging. No  significant foraminal or spinal canal stenosis.  C5-6: Interval removal of anterior fusion hardware. Stable ossific ridging. No significant foraminal or spinal canal stenosis.  C6-7: Prominent posterior endplate marginal osteophytes with ligamentum flavum and facet hypertrophy resulting in mild spinal canal stenosis. There is osteophyte contact on the anterior cord which is mildly flattened. Additionally, there is severe right and moderate left neural foraminal stenosis.  C7-T1: No significant disc displacement, foraminal stenosis, or canal stenosis.  IMPRESSION: 1. No acute osseous abnormality. Interval removal of C5-6 ACDF hardware and new C6-7 anterior fusion from prior cervical MRI. 2. Mild residual spinal canal stenosis at the C6-7 level with osteophyte contact on the anterior cord. 3. Severe right C6-7 neural foraminal stenosis. Multilevel mild and moderate neural foraminal stenosis.   Electronically Signed   By: Kristine Garbe M.D.   On: 09/02/2018 23:50    Results for orders placed during the hospital encounter of 07/11/10  DG Cervical Spine 1 View  Narrative *RADIOLOGY REPORT*  Clinical Data: C5-C6 ACDF with removal of C4-C5 plate.  CERVICAL SPINE - 1 VIEW  Comparison: None.  Findings: Single spot fluoroscopic lateral image of the cervical spine demonstrates anterior fusion at C5-C6 with a plate, screws and intervertebral bone plug.  There is solid interbody fusion at C4-C5 without residual hardware.  The alignment is normal.  IMPRESSION: Intraoperative view following revision of cervical fusion, now extending across the C5-C6 disc space level.  No complications demonstrated.  Original Report Authenticated By: Vivia Ewing, M.D.   Cervical DG 2-3 views:  Results for orders placed during the hospital encounter of 09/01/10  DG Cervical Spine 2-3 Views   Narrative *RADIOLOGY REPORT*  Clinical Data: Next surgery in April,  follow-up  CERVICAL SPINE - 2-3 VIEW  Comparison: C-arm spot films of 07/11/2010  Findings: The cervical vertebrae are straightened in alignment. Anterior fusion at C5-6 appears stable.  The interbody fusion plug is unchanged in position and in height.  No prevertebral soft tissue swelling is seen.  Solid bony fusion is noted at C4-5. Through flexion and extension there is slightly limited range of motion with no malalignment noted.  IMPRESSION:  1.  Straightened alignment.  Anterior fusion at C5-6. 2.  Limited range of motion through flexion and extension.  Original Report Authenticated By: Joretta Bachelor, M.D.   Results for orders placed during the hospital encounter of 10/18/18  DG Shoulder Left   Narrative CLINICAL DATA:  Status post fall with left shoulder pain.  EXAM: LEFT SHOULDER - 2+ VIEW  COMPARISON:  None.  FINDINGS: There is comminuted displaced fracture of the left humeral neck. There is no dislocation.  IMPRESSION: Fracture of proximal left humerus.   Electronically Signed   By: Abelardo Diesel M.D.   On: 10/18/2018 14:35     Lumbar MR wo contrast:  Results for orders placed during the hospital encounter of 09/05/10  MR Lumbar Spine Wo Contrast   Narrative *RADIOLOGY REPORT*  Clinical Data: Low back pain with extremity pain.  Bilateral leg pain  MRI LUMBAR SPINE WITHOUT CONTRAST  Technique:  Multiplanar and multiecho pulse sequences of the lumbar spine were obtained without intravenous contrast.  Comparison: None.  Findings: Normal lumbar alignment.  Negative for fracture or mass lesion.  Hemangiomata are noted L1 and L2.  Conus medullaris is normal and ends at L1.  L1-2:  Mild disc degeneration and spondylosis.  Mild endplate spurring without significant spinal stenosis.  L2-3:  Disc degeneration and spondylosis.  This causes moderate spinal stenosis.  Bilateral facet degeneration.  Lateral recess encroachment bilaterally.  L3-4:  Disc  degeneration with disc bulging and spondylosis. Bilateral facet degeneration and moderate spinal stenosis.  Lateral recess encroachment bilaterally.  L4-5:  Diffuse disc bulging and mild spondylosis.  Bilateral facet degeneration and moderate spinal stenosis.  Moderate foraminal narrowing bilaterally due to spurring.  L5 S1:  Mild disc degeneration.  Right-sided facet degeneration contributes to mild right foraminal encroachment.  IMPRESSION: Moderate spinal stenosis L2-3, L3-4, L4-5 due to disc and facet degeneration.  Original Report Authenticated By: Truett Perna, M.D.    Results for orders placed during the hospital encounter of 03/21/16  DG Lumbar Spine 2-3 Views   Narrative CLINICAL DATA:  L2-L5 PLIF.  EXAM: DG C-ARM 61-120 MIN; LUMBAR SPINE - 2-3 VIEW  COMPARISON:  Preoperative MRI 02/04/2016  FINDINGS: Spinal numbering as on preoperative study. Discectomy and pedicle screws placed from L2-L5. The L2 pedicle screws reach but do not  visibly penetrate the superior endplate. Located intervertebral grafts. No detected fracture. Mild anterolisthesis at L5-S1 also seen on preoperative study.  IMPRESSION: L2-L5 discectomy and fusion.  No unexpected finding.   Electronically Signed   By: Monte Fantasia M.D.   On: 03/21/2016 11:35     Results for orders placed during the hospital encounter of 11/09/18  DG Wrist Complete Left   Narrative CLINICAL DATA:  Pain and swelling after falling. Pain. LEFT wrist injury 10/21/2018  EXAM: LEFT WRIST - COMPLETE 3+ VIEW  COMPARISON:  None  FINDINGS: Mild degenerative changes at the first carpometacarpal joint. No acute fracture or subluxation. No radiopaque foreign body or soft tissue gas.  IMPRESSION: No evidence for acute abnormality.   Electronically Signed   By: Nolon Nations M.D.   On: 11/09/2018 13:07     Complexity Note: Imaging results reviewed. Results shared with Ms. Coralie Common, using Layman's terms.                          ROS  Cardiovascular: Needs antibiotics prior to dental procedures Pulmonary or Respiratory: Exposure to tuberculosis Neurological: No reported neurological signs or symptoms such as seizures, abnormal skin sensations, urinary and/or fecal incontinence, being born with an abnormal open spine and/or a tethered spinal cord Psychological-Psychiatric: No reported psychological or psychiatric signs or symptoms such as difficulty sleeping, anxiety, depression, delusions or hallucinations (schizophrenial), mood swings (bipolar disorders) or suicidal ideations or attempts Gastrointestinal: Irregular, infrequent bowel movements (Constipation) Genitourinary: No reported renal or genitourinary signs or symptoms such as difficulty voiding or producing urine, peeing blood, non-functioning kidney, kidney stones, difficulty emptying the bladder, difficulty controlling the flow of urine, or chronic kidney disease Hematological: No reported hematological signs or symptoms such as prolonged bleeding, low or poor functioning platelets, bruising or bleeding easily, hereditary bleeding problems, low energy levels due to low hemoglobin or being anemic Endocrine: No reported endocrine signs or symptoms such as high or low blood sugar, rapid heart rate due to high thyroid levels, obesity or weight gain due to slow thyroid or thyroid disease Rheumatologic: Joint aches and or swelling due to excess weight (Osteoarthritis) Musculoskeletal: Negative for myasthenia gravis, muscular dystrophy, multiple sclerosis or malignant hyperthermia Work History: Retired  Allergies  Ms. Vinal is allergic to gentamicin; hydralazine; ibandronic acid; and penicillins.  Laboratory Chemistry Profile   Renal Lab Results  Component Value Date   BUN 17 07/19/2016   CREATININE 1.10 (H) 09/02/2018   BCR 18 07/19/2016   GFRAA 73 07/19/2016   GFRNONAA 64 07/19/2016   PROTEINUR NEGATIVE 03/25/2016     Electrolytes Lab  Results  Component Value Date   NA 138 07/19/2016   K 3.9 07/19/2016   CL 94 (L) 07/19/2016   CALCIUM 10.3 07/19/2016   MG 1.6 (L) 09/06/2012     Hepatic Lab Results  Component Value Date   AST 21 07/19/2016   ALT 22 07/19/2016   ALBUMIN 4.9 (H) 07/19/2016   ALKPHOS 100 07/19/2016   LIPASE 153 05/15/2011     ID Lab Results  Component Value Date   STAPHAUREUS NEGATIVE 03/15/2016   MRSAPCR NEGATIVE 03/15/2016     Bone Lab Results  Component Value Date   VD25OH 56.6 04/26/2017     Endocrine Lab Results  Component Value Date   GLUCOSE 96 07/19/2016   GLUCOSEU NEGATIVE 03/25/2016   TSH 2.140 07/19/2016     Neuropathy Lab Results  Component Value Date   VITAMINB12 656 04/26/2017  CNS No results found for: COLORCSF, APPEARCSF, RBCCOUNTCSF, WBCCSF, POLYSCSF, LYMPHSCSF, EOSCSF, PROTEINCSF, GLUCCSF, JCVIRUS, CSFOLI, IGGCSF, LABACHR, ACETBL, LABACHR, ACETBL   Inflammation (CRP: Acute   ESR: Chronic) No results found for: CRP, ESRSEDRATE, LATICACIDVEN   Rheumatology No results found for: RF, ANA, LABURIC, URICUR, LYMEIGGIGMAB, LYMEABIGMQN, HLAB27   Coagulation Lab Results  Component Value Date   PLT 214 05/12/2017     Cardiovascular Lab Results  Component Value Date   CKTOTAL 158 09/05/2012   CKMB 3.1 09/05/2012   TROPONINI < 0.02 09/05/2012   HGB 14.0 05/12/2017   HCT 39.8 05/12/2017     Screening Lab Results  Component Value Date   STAPHAUREUS NEGATIVE 03/15/2016   MRSAPCR NEGATIVE 03/15/2016     Cancer No results found for: CEA, CA125, LABCA2   Allergens No results found for: ALMOND, APPLE, ASPARAGUS, AVOCADO, BANANA, BARLEY, BASIL, BAYLEAF, GREENBEAN, LIMABEAN, WHITEBEAN, BEEFIGE, REDBEET, BLUEBERRY, BROCCOLI, CABBAGE, MELON, CARROT, CASEIN, CASHEWNUT, CAULIFLOWER, CELERY     Note: Lab results reviewed.   [place]  Drug: Ms. Riggsbee  reports current drug use. Frequency: 1.00 time per week. Drug: Marijuana. Alcohol:  reports no history of  alcohol use. Tobacco:  reports that she quit smoking about 43 years ago. Her smoking use included cigarettes. She has never used smokeless tobacco. Medical:  has a past medical history of Anxiety, Arthritis, Depression, Headache, Hypertension, and Positive skin test for tuberculosis. Family: family history includes Aneurysm in her mother; Lung cancer in her brother.  Past Surgical History:  Procedure Laterality Date   ABDOMINAL HYSTERECTOMY     BACK SURGERY     x3 previous cervical fusion & then removed hardware    BREAST SURGERY Right    benign-    HAND TENDON SURGERY Right 2016   JOINT REPLACEMENT Right    also had a partial on the L    LUMBAR FUSION     MYRINGOTOMY WITH TUBE PLACEMENT Right    TONSILLECTOMY     TUBAL LIGATION     had a 2nd. surgery for adhesions   Active Ambulatory Problems    Diagnosis Date Noted   Degenerative spondylolisthesis 03/21/2016   Varicose veins of both lower extremities with pain 05/17/2016   Chronic venous insufficiency 05/17/2016   Tennis Must Quervain's disease (radial styloid tenosynovitis) 03/07/2015   Fibromyalgia 02/26/2013   Ganglion cyst of dorsum of right wrist 03/07/2015   OA (osteoarthritis) 11/21/2012   Status post left partial knee replacement 04/07/2014   Gait instability 07/19/2016   Hypertension 07/19/2016   Hyperlipidemia 07/19/2016   Osteopenia of spine 07/19/2016   History of lumbar fusion 07/19/2016   Hx of fusion of cervical spine 07/19/2016   Migraine headache 07/19/2016   Anxiety disorder 07/19/2016   Allergic rhinitis 07/19/2016   Constipation 07/19/2016   Lung nodules 07/19/2016   History of positive PPD 07/19/2016   Patent pressure equalization (PE) tube on right side 07/19/2016   Vitamin D deficiency 07/20/2016   B12 deficiency 07/20/2016   Depression 08/25/2016   Acquired genu varum 11/30/2016   Obesity (BMI 30.0-34.9) 05/25/2017   Leg pain, left 10/03/2017   Resolved Ambulatory  Problems    Diagnosis Date Noted   Pain in limb 05/17/2016   Low back pain 11/21/2012   Narcotic drug use 11/21/2012   Primary osteoarthritis of first carpometacarpal joint of right hand 03/07/2015   Primary osteoarthritis of left knee 01/15/2014   Right hand pain 02/26/2013   Right sided sciatica 11/21/2012   Status post left unicompartmental knee  replacement 06/18/2014   Past Medical History:  Diagnosis Date   Anxiety    Arthritis    Headache    Positive skin test for tuberculosis    Constitutional Exam  General appearance: Well nourished, well developed, and well hydrated. In no apparent acute distress Vitals:   08/25/19 1356  BP: 134/64  Pulse: 80  Resp: 16  Temp: 97.9 F (36.6 C)  TempSrc: Temporal  SpO2: 97%  Weight: 178 lb (80.7 kg)  Height: '5\' 3"'$  (1.6 m)   BMI Assessment: Estimated body mass index is 31.53 kg/m as calculated from the following:   Height as of this encounter: '5\' 3"'$  (1.6 m).   Weight as of this encounter: 178 lb (80.7 kg).  BMI interpretation table: BMI level Category Range association with higher incidence of chronic pain  <18 kg/m2 Underweight   18.5-24.9 kg/m2 Ideal body weight   25-29.9 kg/m2 Overweight Increased incidence by 20%  30-34.9 kg/m2 Obese (Class I) Increased incidence by 68%  35-39.9 kg/m2 Severe obesity (Class II) Increased incidence by 136%  >40 kg/m2 Extreme obesity (Class III) Increased incidence by 254%   Patient's current BMI Ideal Body weight  Body mass index is 31.53 kg/m. Ideal body weight: 52.4 kg (115 lb 8.3 oz) Adjusted ideal body weight: 63.7 kg (140 lb 8.2 oz)   BMI Readings from Last 4 Encounters:  08/25/19 31.53 kg/m  11/09/18 31.53 kg/m  10/18/18 31.35 kg/m  03/06/18 31.18 kg/m   Wt Readings from Last 4 Encounters:  08/25/19 178 lb (80.7 kg)  11/09/18 178 lb (80.7 kg)  10/18/18 177 lb (80.3 kg)  03/06/18 176 lb (79.8 kg)    Psych/Mental status: Alert, oriented x 3 (person, place, &  time)       Eyes: PERLA Respiratory: No evidence of acute respiratory distress  Cervical Spine Exam  Skin & Axial Inspection: Well healed scar from previous spine surgery detected Alignment: Symmetrical Functional ROM: Pain restricted ROM, bilaterally Stability: No instability detected Muscle Tone/Strength: Functionally intact. No obvious neuro-muscular anomalies detected. Sensory (Neurological): Dermatomal pain pattern   Upper Extremity (UE) Exam    Side: Right upper extremity  Side: Left upper extremity  Skin & Extremity Inspection: Skin color, temperature, and hair growth are WNL. No peripheral edema or cyanosis. No masses, redness, swelling, asymmetry, or associated skin lesions. No contractures.  Skin & Extremity Inspection: Skin color, temperature, and hair growth are WNL. No peripheral edema or cyanosis. No masses, redness, swelling, asymmetry, or associated skin lesions. No contractures.  Functional ROM: Pain restricted ROM for shoulder and elbow  Functional ROM: Restricted ROM for all joints of upper extremity  Muscle Tone/Strength: Functionally intact. No obvious neuro-muscular anomalies detected.  Muscle Tone/Strength: Functionally intact. No obvious neuro-muscular anomalies detected.  Sensory (Neurological): Dermatomal pain pattern          Sensory (Neurological): Dermatomal pain pattern          Palpation: No palpable anomalies              Palpation: No palpable anomalies              Provocative Test(s):  Phalen's test: deferred Tinel's test: deferred Apley's scratch test (touch opposite shoulder):  Action 1 (Across chest): Decreased ROM Action 2 (Overhead): Decreased ROM Action 3 (LB reach): Decreased ROM   Provocative Test(s):  Phalen's test: deferred Tinel's test: deferred Apley's scratch test (touch opposite shoulder):  Action 1 (Across chest): Decreased ROM Action 2 (Overhead): Decreased ROM Action 3 (LB reach):  Decreased ROM     Thoracic Spine Area Exam   Skin & Axial Inspection: No masses, redness, or swelling Alignment: Symmetrical Functional ROM: Unrestricted ROM Stability: No instability detected Muscle Tone/Strength: Functionally intact. No obvious neuro-muscular anomalies detected. Sensory (Neurological): Unimpaired Muscle strength & Tone: No palpable anomalies  Lumbar Exam  Skin & Axial Inspection: Well healed scar from previous spine surgery detected Alignment: Scoliosis detected Functional ROM: Decreased ROM affecting both sides Stability: No instability detected Muscle Tone/Strength: Functionally intact. No obvious neuro-muscular anomalies detected. Sensory (Neurological): Musculoskeletal pain pattern  Provocative Tests: Hyperextension/rotation test: Unable to perform due to fusion restriction. Lumbar quadrant test (Kemp's test): (+) bilateral for foraminal stenosis Lateral bending test: Unable to perform due to fusion restriction. Patrick's Maneuver: (+) for bilateral S-I arthralgia             FABER* test: (+) for bilateral S-I arthralgia             S-I anterior distraction/compression test: deferred today         S-I lateral compression test: deferred today         S-I Thigh-thrust test: deferred today         S-I Gaenslen's test: deferred today         *(Flexion, ABduction and External Rotation)  Gait & Posture Assessment  Ambulation: Limited Gait: Relatively normal for age and body habitus Posture: WNL   Lower Extremity Exam    Side: Right lower extremity  Side: Left lower extremity  Stability: No instability observed          Stability: No instability observed          Skin & Extremity Inspection: Evidence of prior arthroplastic surgery  Skin & Extremity Inspection: Skin color, temperature, and hair growth are WNL. No peripheral edema or cyanosis. No masses, redness, swelling, asymmetry, or associated skin lesions. No contractures.  Functional ROM: Pain restricted ROM for hip and knee joints          Functional  ROM: Pain restricted ROM for hip and knee joints          Muscle Tone/Strength: Functionally intact. No obvious neuro-muscular anomalies detected.  Muscle Tone/Strength: Functionally intact. No obvious neuro-muscular anomalies detected.  Sensory (Neurological): Arthropathic arthralgia        Sensory (Neurological): Unimpaired        DTR: Patellar: deferred today Achilles: deferred today Plantar: deferred today  DTR: Patellar: deferred today Achilles: deferred today Plantar: deferred today  Palpation: No palpable anomalies  Palpation: No palpable anomalies   Assessment  Primary Diagnosis & Pertinent Problem List: The primary encounter diagnosis was Lumbar radiculopathy. Diagnoses of Lumbar facet arthropathy, Lumbar spondylosis, Chronic radicular lumbar pain, SI joint arthritis, Sacroiliac joint pain, and Chronic pain syndrome were also pertinent to this visit.  Visit Diagnosis (New problems to examiner): 1. Lumbar radiculopathy   2. Lumbar facet arthropathy   3. Lumbar spondylosis   4. Chronic radicular lumbar pain   5. SI joint arthritis   6. Sacroiliac joint pain   7. Chronic pain syndrome    Plan of Care (Initial workup plan)  I had extensive discussion with the patient regarding treatment plan.  We discussed spinal cord stimulator trial and the details of it.  I informed the patient that the trial is for 7 days and that she will need to be on antibiotics for 7 days and limit her bending, lifting and twisting.  If trial is successful, she will be referred to neurosurgery for  permanent implant.  Prior to consideration of spinal cord stimulator trial, we will need to evaluate the patient's interlaminar windows to see if they are patent enough for a percutaneous trial.  If they are patent enough, we will need to obtain lumbar MRI and thoracic MRI prior to spinal cord stimulator trial for planning purposes.  We will need to rule out thoracic canal stenosis.  Patient will also need psych  evaluation which is customary for all new patients being considered for SCS trial.  In the interim, will obtain previous interventional records from pain provider in [place].  Given her diffuse pain in multiple lumbar spinal pathology, recommend caudal epidural steroid injection.  Risks and benefits reviewed and patient like to proceed.  Given her frequent and regular marijuana usage, will focus primarily on interventional pain therapies.  Will avoid opioid medications.  She has tried gabapentin and Lyrica in the past which were not very helpful.  Do not recommend Cymbalta in the context of her being on an SSRI.    Procedure Orders     Caudal Epidural Injection   Interventional management options: Ms. Hereford was informed that there is no guarantee that she would be a candidate for interventional therapies. The decision will be based on the results of diagnostic studies, as well as Ms. Entwistle's risk profile.  Procedure(s) under consideration:  Caudal ESI Possible spinal cord stimulator trial, will need lumbar MRI, thoracic MRI, psych eval   Provider-requested follow-up: Return in about 1 week (around 09/01/2019) for Caudal ESI.  Future Appointments  Date Time Provider [place]  08/28/2019  8:00 PM [facility]  09/01/2019 10:45 AM Gillis Santa, MD [facility] None   Time stamp: 45 mins   Note by: Gillis Santa, MD Date: 08/25/2019; Time: 3:29 PM

## 2019-08-26 DIAGNOSIS — Z1231 Encounter for screening mammogram for malignant neoplasm of breast: Secondary | ICD-10-CM | POA: Diagnosis not present

## 2019-08-28 ENCOUNTER — Other Ambulatory Visit: Payer: Self-pay

## 2019-08-28 ENCOUNTER — Ambulatory Visit
Admission: RE | Admit: 2019-08-28 | Discharge: 2019-08-28 | Disposition: A | Discharge status: Home / Self Care | Payer: PPO | Source: Ambulatory Visit | Attending: Orthopedic Surgery | Admitting: Orthopedic Surgery

## 2019-08-28 DIAGNOSIS — M25572 Pain in left ankle and joints of left foot: Secondary | ICD-10-CM | POA: Diagnosis not present

## 2019-08-28 DIAGNOSIS — S82432A Displaced oblique fracture of shaft of left fibula, initial encounter for closed fracture: Secondary | ICD-10-CM | POA: Diagnosis not present

## 2019-08-28 DIAGNOSIS — M19172 Post-traumatic osteoarthritis, left ankle and foot: Secondary | ICD-10-CM | POA: Diagnosis not present

## 2019-08-28 DIAGNOSIS — G8929 Other chronic pain: Secondary | ICD-10-CM | POA: Diagnosis not present

## 2019-08-28 DIAGNOSIS — S82832A Other fracture of upper and lower end of left fibula, initial encounter for closed fracture: Secondary | ICD-10-CM | POA: Diagnosis not present

## 2019-09-01 ENCOUNTER — Other Ambulatory Visit: Payer: Self-pay

## 2019-09-01 ENCOUNTER — Ambulatory Visit
Admission: RE | Admit: 2019-09-01 | Discharge: 2019-09-01 | Disposition: A | Discharge status: Home / Self Care | Payer: PPO | Source: Ambulatory Visit | Attending: Student in an Organized Health Care Education/Training Program | Admitting: Student in an Organized Health Care Education/Training Program

## 2019-09-01 ENCOUNTER — Ambulatory Visit (HOSPITAL_BASED_OUTPATIENT_CLINIC_OR_DEPARTMENT_OTHER): Payer: PPO | Admitting: Student in an Organized Health Care Education/Training Program

## 2019-09-01 ENCOUNTER — Encounter: Payer: Self-pay | Admitting: Student in an Organized Health Care Education/Training Program

## 2019-09-01 VITALS — BP 128/72 | HR 75 | Temp 97.2°F | Resp 16 | Ht 63.0 in | Wt 180.0 lb

## 2019-09-01 DIAGNOSIS — G894 Chronic pain syndrome: Secondary | ICD-10-CM | POA: Insufficient documentation

## 2019-09-01 DIAGNOSIS — M5416 Radiculopathy, lumbar region: Secondary | ICD-10-CM | POA: Insufficient documentation

## 2019-09-01 DIAGNOSIS — G8929 Other chronic pain: Secondary | ICD-10-CM | POA: Insufficient documentation

## 2019-09-01 MED ORDER — SODIUM CHLORIDE (PF) 0.9 % IJ SOLN
INTRAMUSCULAR | Status: AC
  Filled 2019-09-01: qty 10

## 2019-09-01 MED ORDER — ROPIVACAINE HCL 2 MG/ML IJ SOLN
INTRAMUSCULAR | Status: AC
  Filled 2019-09-01: qty 10

## 2019-09-01 MED ORDER — LIDOCAINE HCL 2 % IJ SOLN
INTRAMUSCULAR | Status: AC
  Filled 2019-09-01: qty 20

## 2019-09-01 MED ORDER — ROPIVACAINE HCL 2 MG/ML IJ SOLN
2.0000 mL | Freq: Once | INTRAMUSCULAR | Status: AC
  Administered 2019-09-01: 2 mL via EPIDURAL

## 2019-09-01 MED ORDER — IOHEXOL 180 MG/ML  SOLN
10.0000 mL | Freq: Once | INTRAMUSCULAR | Status: AC
  Administered 2019-09-01: 10 mL via EPIDURAL

## 2019-09-01 MED ORDER — LIDOCAINE HCL 2 % IJ SOLN
20.0000 mL | Freq: Once | INTRAMUSCULAR | Status: AC
  Administered 2019-09-01: 400 mg

## 2019-09-01 MED ORDER — DEXAMETHASONE SODIUM PHOSPHATE 10 MG/ML IJ SOLN
10.0000 mg | Freq: Once | INTRAMUSCULAR | Status: AC
  Administered 2019-09-01: 10 mg

## 2019-09-01 MED ORDER — DEXAMETHASONE SODIUM PHOSPHATE 10 MG/ML IJ SOLN
INTRAMUSCULAR | Status: AC
  Filled 2019-09-01: qty 1

## 2019-09-01 MED ORDER — SODIUM CHLORIDE 0.9% FLUSH
2.0000 mL | Freq: Once | INTRAVENOUS | Status: AC
  Administered 2019-09-01: 2 mL

## 2019-09-01 NOTE — Progress Notes (Signed)
Safety precautions to be maintained throughout the outpatient stay will include: orient to surroundings, keep bed in low position, maintain call bell within reach at all times, provide assistance with transfer out of bed and ambulation.  

## 2019-09-01 NOTE — Progress Notes (Signed)
PROVIDER NOTE: Information contained herein reflects review and annotations entered in association with encounter. Interpretation of such information and data should be left to medically-trained personnel. Information provided to patient can be located elsewhere in the medical record under "Patient Instructions". Document created using STT-dictation technology, any transcriptional errors that may result from process are unintentional.    Patient: Carol [name]  Service Category: Procedure  Provider: Gillis Santa, MD  DOB: 02-16-1946  DOS: 09/01/2019  Location: [facility]  MRN: [id number]  Setting: Ambulatory - outpatient  Referring Provider: Sallee Lange, *  Type: Established Patient  Specialty: Interventional Pain Management  PCP: Sallee Lange, NP   Primary Reason for Visit: Interventional Pain Management Treatment. CC: Back Pain (lumbar bilateral )  Procedure:          Anesthesia, Analgesia, Anxiolysis:  Type: Diagnostic Epidural Steroid Injection #1  Region: Caudal Level: Sacrococcygeal   Laterality: Midline       Type: Local Anesthesia  Local Anesthetic: Lidocaine 1-2%  Position: Prone   Indications: 1. Lumbar radiculopathy   2. Chronic radicular lumbar pain   3. Chronic pain syndrome    Pain Score: Pre-procedure: 10-Worst pain ever/10 Post-procedure: 0-No pain/10   Pre-op Assessment:  Carol [name] is a 74 y.o. (year old), female patient, seen today for interventional treatment. She  has a past surgical history that includes Abdominal hysterectomy; Tonsillectomy; Breast surgery (Right); Tubal ligation; Joint replacement (Right); Myringotomy with tube placement (Right); Hand tendon surgery (Right, 2016); Back surgery; and Lumbar fusion. Carol [name] has a current medication list which includes the following prescription(s): alendronate, azelastine, chlorthalidone, clindamycin, clonazepam, diazepam, diclofenac sodium, docusate sodium, escitalopram,  fluoxetine, fluticasone, ipratropium, meloxicam, potassium chloride, psyllium-calcium, pyridoxine, rizatriptan, rosuvastatin, sumatriptan, trazodone, and vitamin d (cholecalciferol). Her primarily concern today is the Back Pain (lumbar bilateral )  Initial Vital Signs:  Pulse/HCG Rate: 73  Temp: (!) 97.2 F (36.2 C) Resp: 16 BP: 129/72 SpO2: 100 %  BMI: Estimated body mass index is 31.89 kg/m as calculated from the following:   Height as of this encounter: 5\' 3"  (1.6 m).   Weight as of this encounter: 180 lb (81.6 kg).  Risk Assessment: Allergies: Reviewed. She is allergic to gentamicin, hydralazine, ibandronic acid, and penicillins.  Allergy Precautions: None required Coagulopathies: Reviewed. None identified.  Blood-thinner therapy: None at this time Active Infection(s): Reviewed. None identified. Carol [name] is afebrile  Site Confirmation: Carol [name] was asked to confirm the procedure and laterality before marking the site Procedure checklist: Completed Consent: Before the procedure and under the influence of no sedative(s), amnesic(s), or anxiolytics, the patient was informed of the treatment options, risks and possible complications. To fulfill our ethical and legal obligations, as recommended by the American Medical Association's Code of Ethics, I have informed the patient of my clinical impression; the nature and purpose of the treatment or procedure; the risks, benefits, and possible complications of the intervention; the alternatives, including doing nothing; the risk(s) and benefit(s) of the alternative treatment(s) or procedure(s); and the risk(s) and benefit(s) of doing nothing. The patient was provided information about the general risks and possible complications associated with the procedure. These may include, but are not limited to: failure to achieve desired goals, infection, bleeding, organ or nerve damage, allergic reactions, paralysis, and death. In addition, the patient  was informed of those risks and complications associated to Spine-related procedures, such as failure to decrease pain; infection (i.e.: Meningitis, epidural or intraspinal abscess); bleeding (i.e.: epidural hematoma, subarachnoid hemorrhage,  or any other type of intraspinal or peri-dural bleeding); organ or nerve damage (i.e.: Any type of peripheral nerve, nerve root, or spinal cord injury) with subsequent damage to sensory, motor, and/or autonomic systems, resulting in permanent pain, numbness, and/or weakness of one or several areas of the body; allergic reactions; (i.e.: anaphylactic reaction); and/or death. Furthermore, the patient was informed of those risks and complications associated with the medications. These include, but are not limited to: allergic reactions (i.e.: anaphylactic or anaphylactoid reaction(s)); adrenal axis suppression; blood sugar elevation that in diabetics may result in ketoacidosis or comma; water retention that in patients with history of congestive heart failure may result in shortness of breath, pulmonary edema, and decompensation with resultant heart failure; weight gain; swelling or edema; medication-induced neural toxicity; particulate matter embolism and blood vessel occlusion with resultant organ, and/or nervous system infarction; and/or aseptic necrosis of one or more joints. Finally, the patient was informed that Medicine is not an exact science; therefore, there is also the possibility of unforeseen or unpredictable risks and/or possible complications that may result in a catastrophic outcome. The patient indicated having understood very clearly. We have given the patient no guarantees and we have made no promises. Enough time was given to the patient to ask questions, all of which were answered to the patient's satisfaction. Carol [name] has indicated that she wanted to continue with the procedure. Attestation: I, the ordering provider, attest that I have discussed with  the patient the benefits, risks, side-effects, alternatives, likelihood of achieving goals, and potential problems during recovery for the procedure that I have provided informed consent. Date   Time: 09/01/2019 10:38 AM  Pre-Procedure Preparation:  Monitoring: As per clinic protocol. Respiration, ETCO2, SpO2, BP, heart rate and rhythm monitor placed and checked for adequate function Safety Precautions: Patient was assessed for positional comfort and pressure points before starting the procedure. Time-out: I initiated and conducted the "Time-out" before starting the procedure, as per protocol. The patient was asked to participate by confirming the accuracy of the "Time Out" information. Verification of the correct person, site, and procedure were performed and confirmed by me, the nursing staff, and the patient. "Time-out" conducted as per Joint Commission's Universal Protocol (UP.01.01.01). Time: 1128  Description of Procedure:          Target Area: Caudal Epidural Canal. Approach: Midline approach. Area Prepped: Entire Posterior Sacrococcygeal Region DuraPrep (Iodine Povacrylex [0.7% available iodine] and Isopropyl Alcohol, 74% w/w) Safety Precautions: Aspiration looking for blood return was conducted prior to all injections. At no point did we inject any substances, as a needle was being advanced. No attempts were made at seeking any paresthesias. Safe injection practices and needle disposal techniques used. Medications properly checked for expiration dates. SDV (single dose vial) medications used. Description of the Procedure: Protocol guidelines were followed. The patient was placed in position over the fluoroscopy table. The target area was identified and the area prepped in the usual manner. Skin & deeper tissues infiltrated with local anesthetic. Appropriate amount of time allowed to pass for local anesthetics to take effect. The procedure needles were then advanced to the target area. Proper  needle placement secured. Negative aspiration confirmed. Solution injected in intermittent fashion, asking for systemic symptoms every 0.5cc of injectate. The needles were then removed and the area cleansed, making sure to leave some of the prepping solution back to take advantage of its long term bactericidal properties. Vitals:   09/01/19 1059 09/01/19 1122 09/01/19 1132 09/01/19 1141  BP: 129/72 114/82  134/70 128/72  Pulse: 73 86 75 75  Resp: 16 16 15 16   Temp: (!) 97.2 F (36.2 C)     TempSrc: Temporal     SpO2: 100% 100% 98% (!) 89%  Weight: 180 lb (81.6 kg)     Height: 5\' 3"  (1.6 m)       Start Time: 1128 hrs. End Time: 1133 hrs. Materials:  Needle(s) Type: Epidural needle Gauge: 22G Length: 3.5-in Medication(s): Please see orders for medications and dosing details. 6 cc solution made of 3 cc of preservative-free saline, 2 cc of 0.2% ropivacaine, 1 cc of Decadron 10 mg/cc.  Imaging Guidance (Spinal):          Type of Imaging Technique: Fluoroscopy Guidance (Spinal) Indication(s): Assistance in needle guidance and placement for procedures requiring needle placement in or near specific anatomical locations not easily accessible without such assistance. Exposure Time: Please see nurses notes. Contrast: Before injecting any contrast, we confirmed that the patient did not have an allergy to iodine, shellfish, or radiological contrast. Once satisfactory needle placement was completed at the desired level, radiological contrast was injected. Contrast injected under live fluoroscopy. No contrast complications. See chart for type and volume of contrast used. Fluoroscopic Guidance: I was personally present during the use of fluoroscopy. "Tunnel Vision Technique" used to obtain the best possible view of the target area. Parallax error corrected before commencing the procedure. "Direction-depth-direction" technique used to introduce the needle under continuous pulsed fluoroscopy. Once target was  reached, antero-posterior, oblique, and lateral fluoroscopic projection used confirm needle placement in all planes. Images permanently stored in EMR. Interpretation: I personally interpreted the imaging intraoperatively. Adequate needle placement confirmed in multiple planes. Appropriate spread of contrast into desired area was observed. No evidence of afferent or efferent intravascular uptake. No intrathecal or subarachnoid spread observed. Permanent images saved into the patient's record.  Antibiotic Prophylaxis:   Anti-infectives (From admission, onward)   None     Indication(s): None identified  Post-operative Assessment:  Post-procedure Vital Signs:  Pulse/HCG Rate: 75  Temp: (!) 97.2 F (36.2 C) Resp: 16 BP: 128/72 SpO2: (!) 89 %  EBL: None  Complications: No immediate post-treatment complications observed by team, or reported by patient.  Note: The patient tolerated the entire procedure well. A repeat set of vitals were taken after the procedure and the patient was kept under observation following institutional policy, for this type of procedure. Post-procedural neurological assessment was performed, showing return to baseline, prior to discharge. The patient was provided with post-procedure discharge instructions, including a section on how to identify potential problems. Should any problems arise concerning this procedure, the patient was given instructions to immediately contact , at any time, without hesitation. In any case, we plan to contact the patient by telephone for a follow-up status report regarding this interventional procedure.  Comments:  No additional relevant information.  5 out of 5 strength bilateral lower extremity: Plantar flexion, dorsiflexion, knee flexion, knee extension.  Plan of Care  Orders:  Orders Placed This Encounter  Procedures   DG PAIN CLINIC C-ARM 1-60 MIN NO REPORT    Intraoperative interpretation by procedural physician at [facility].    Standing Status:   Standing    Number of Occurrences:   1    Order Specific Question:   Reason for exam:    Answer:   Assistance in needle guidance and placement for procedures requiring needle placement in or near specific anatomical locations not easily accessible without such assistance.  Medications ordered for procedure: Meds ordered this encounter  Medications   iohexol (OMNIPAQUE) 180 MG/ML injection 10 mL    Must be Myelogram-compatible. If not available, you may substitute with a water-soluble, non-ionic, hypoallergenic, myelogram-compatible radiological contrast medium.   lidocaine (XYLOCAINE) 2 % (with pres) injection 400 mg   ropivacaine (PF) 2 mg/mL (0.2%) (NAROPIN) injection 2 mL   sodium chloride flush (NS) 0.9 % injection 2 mL   dexamethasone (DECADRON) injection 10 mg   Medications administered: We administered iohexol, lidocaine, ropivacaine (PF) 2 mg/mL (0.2%), sodium chloride flush, and dexamethasone.  See the medical record for exact dosing, route, and time of administration.  Follow-up plan:   Return in about 4 weeks (around 09/29/2019) for Post Procedure Evaluation, in person.     Status post caudal ESI on 09/01/2019.  Interlaminar window seem appropriate for spinal cord stimulator trial   Recent Visits Date Type Provider Dept  08/25/19 Office Visit Edward Jolly, MD [facility]  Showing recent visits within past 90 days and meeting all other requirements Today's Visits Date Type Provider Dept  09/01/19 Procedure visit Edward Jolly, MD [facility]  Showing today's visits and meeting all other requirements Future Appointments Date Type Provider Dept  10/08/19 Appointment Edward Jolly, MD [facility]  Showing future appointments within next 90 days and meeting all other requirements  Disposition: Discharge home  Discharge (Date   Time): 09/01/2019; 1144 hrs.   Primary Care Physician: Myrene Buddy, NP Location: [facility] Note by: Edward Jolly, MD Date: 09/01/2019; Time: 2:01 PM  Disclaimer:  Medicine is not an exact science. The only guarantee in medicine is that nothing is guaranteed. It is important to note that the decision to proceed with this intervention was based on the information collected from the patient. The Data and conclusions were drawn from the patient's questionnaire, the interview, and the physical examination. Because the information was provided in large part by the patient, it cannot be guaranteed that it has not been purposely or unconsciously manipulated. Every effort has been made to obtain as much relevant data as possible for this evaluation. It is important to note that the conclusions that lead to this procedure are derived in large part from the available data. Always take into account that the treatment will also be dependent on availability of resources and existing treatment guidelines, considered by other Pain Management Practitioners as being common knowledge and practice, at the time of the intervention. For Medico-Legal purposes, it is also important to point out that variation in procedural techniques and pharmacological choices are the acceptable norm. The indications, contraindications, technique, and results of the above procedure should only be interpreted and judged by a Board-Certified Interventional Pain Specialist with extensive familiarity and expertise in the same exact procedure and technique.

## 2019-09-01 NOTE — Patient Instructions (Signed)
____________________________________________________________________________________________  Post-Procedure Discharge Instructions  Instructions:  Apply ice:   Purpose: This will minimize any swelling and discomfort after procedure.   When: Day of procedure, as soon as you get home.  How: Fill a plastic sandwich bag with crushed ice. Cover it with a small towel and apply to injection site.  How long: (15 min on, 15 min off) Apply for 15 minutes then remove x 15 minutes.  Repeat sequence on day of procedure, until you go to bed.  Apply heat:   Purpose: To treat any soreness and discomfort from the procedure.  When: Starting the next day after the procedure.  How: Apply heat to procedure site starting the day following the procedure.  How long: May continue to repeat daily, until discomfort goes away.  Food intake: Start with clear liquids (like water) and advance to regular food, as tolerated.   Physical activities: Keep activities to a minimum for the first 8 hours after the procedure. After that, then as tolerated.  Driving: If you have received any sedation, be responsible and do not drive. You are not allowed to drive for 24 hours after having sedation.  Blood thinner: (Applies only to those taking blood thinners) You may restart your blood thinner 6 hours after your procedure.  Insulin: (Applies only to Diabetic patients taking insulin) As soon as you can eat, you may resume your normal dosing schedule.  Infection prevention: Keep procedure site clean and dry. Shower daily and clean area with soap and water.  Post-procedure Pain Diary: Extremely important that this be done correctly and accurately. Recorded information will be used to determine the next step in treatment. For the purpose of accuracy, follow these rules:  Evaluate only the area treated. Do not report or include pain from an untreated area. For the purpose of this evaluation, ignore all other areas of pain,  except for the treated area.  After your procedure, avoid taking a long nap and attempting to complete the pain diary after you wake up. Instead, set your alarm clock to go off every hour, on the hour, for the initial 8 hours after the procedure. Document the duration of the numbing medicine, and the relief you are getting from it.  Do not go to sleep and attempt to complete it later. It will not be accurate. If you received sedation, it is likely that you were given a medication that may cause amnesia. Because of this, completing the diary at a later time may cause the information to be inaccurate. This information is needed to plan your care.  Follow-up appointment: Keep your post-procedure follow-up evaluation appointment after the procedure (usually 2 weeks for most procedures, 6 weeks for radiofrequencies). DO NOT FORGET to bring you pain diary with you.   Expect: (What should I expect to see with my procedure?)  From numbing medicine (AKA: Local Anesthetics): Numbness or decrease in pain. You may also experience some weakness, which if present, could last for the duration of the local anesthetic.  Onset: Full effect within 15 minutes of injected.  Duration: It will depend on the type of local anesthetic used. On the average, 1 to 8 hours.   From steroids (Applies only if steroids were used): Decrease in swelling or inflammation. Once inflammation is improved, relief of the pain will follow.  Onset of benefits: Depends on the amount of swelling present. The more swelling, the longer it will take for the benefits to be seen. In some cases, up to 10 days.    Duration: Steroids will stay in the system x 2 weeks. Duration of benefits will depend on multiple posibilities including persistent irritating factors.  Side-effects: If present, they may typically last 2 weeks (the duration of the steroids).  Frequent: Cramps (if they occur, drink Gatorade and take over-the-counter Magnesium 450-500 mg  once to twice a day); water retention with temporary weight gain; increases in blood sugar; decreased immune system response; increased appetite.  Occasional: Facial flushing (red, warm cheeks); mood swings; menstrual changes.  Uncommon: Long-term decrease or suppression of natural hormones; bone thinning. (These are more common with higher doses or more frequent use. This is why we prefer that our patients avoid having any injection therapies in other practices.)   Very Rare: Severe mood changes; psychosis; aseptic necrosis.  From procedure: Some discomfort is to be expected once the numbing medicine wears off. This should be minimal if ice and heat are applied as instructed.  Call if: (When should I call?)  You experience numbness and weakness that gets worse with time, as opposed to wearing off.  New onset bowel or bladder incontinence. (Applies only to procedures done in the spine)  Emergency Numbers:  Durning business hours (Monday - Thursday, 8:00 AM - 4:00 PM) (Friday, 9:00 AM - 12:00 Noon): [phone]  After hours: [phone]  NOTE: If you are having a problem and are unable connect with, or to talk to a provider, then go to your nearest urgent care or emergency department. If the problem is serious and urgent, please call 911. ____________________________________________________________________________________________   Epidural Steroid Injection  An epidural steroid injection is a shot of steroid medicine and numbing medicine that is given into the space between the spinal cord and the bones of the back (epidural space). The shot helps relieve pain caused by an irritated or swollen nerve root. The amount of pain relief you get from the injection depends on what is causing the nerve to be swollen and irritated, and how long your pain lasts. You are more likely to benefit from this injection if your pain is strong and comes on suddenly rather than if you have had  long-term (chronic) pain. Tell a health care provider about:  Any allergies you have.  All medicines you are taking, including vitamins, herbs, eye drops, creams, and over-the-counter medicines.  Any problems you or family members have had with anesthetic medicines.  Any blood disorders you have.  Any surgeries you have had.  Any medical conditions you have.  Whether you are pregnant or may be pregnant. What are the risks? Generally, this is a safe procedure. However, problems may occur, including:  Headache.  Bleeding.  Infection.  Allergic reaction to medicines.  Nerve damage. What happens before the procedure? Staying hydrated Follow instructions from your health care provider about hydration, which may include:  Up to 2 hours before the procedure - you may continue to drink clear liquids, such as water, clear fruit juice, black coffee, and plain tea. Eating and drinking restrictions Follow instructions from your health care provider about eating and drinking, which may include:  8 hours before the procedure - stop eating heavy meals or foods, such as meat, fried foods, or fatty foods.  6 hours before the procedure - stop eating light meals or foods, such as toast or cereal.  6 hours before the procedure - stop drinking milk or drinks that contain milk.  2 hours before the procedure - stop drinking clear liquids. Medicines  You may be given   medicines to lower anxiety.  Ask your health care provider about: ? Changing or stopping your regular medicines. This is especially important if you are taking diabetes medicines or blood thinners. ? Taking medicines such as aspirin and ibuprofen. These medicines can thin your blood. Do not take these medicines unless your health care provider tells you to take them. ? Taking over-the-counter medicines, vitamins, herbs, and supplements.  Ask your health care provider what steps will be taken to prevent infection. General  instructions  Plan to have someone take you home from the hospital or clinic.  If you will be going home right after the procedure, plan to have someone with you for 24 hours. What happens during the procedure?  An IV will be inserted into one of your veins.  You will be given one or more of the following: ? A medicine to help you relax (sedative). ? A medicine to numb the area (local anesthetic).  You will be asked to lie on your abdomen or sit.  The injection site will be cleaned.  A needle will be inserted through your skin into the epidural space. This may cause you some discomfort. An X-ray machine will be used to guide the needle as close as possible to the affected nerve.  A steroid medicine and a local anesthetic will be injected into the epidural space.  The needle and IV will be removed.  A bandage (dressing) will be put over the injection site. The procedure may vary among health care providers and hospitals. What can I expect after the procedure? Follow these instructions at home: Injection site care  You may remove the bandage (dressing) after 24 hours.  Check your injection site every day for signs of infection. Check for: ? Redness, swelling, or pain. ? Fluid or blood. ? Warmth. ? Pus or a bad smell. Managing pain, stiffness, and swelling  For 24 hours after the procedure: ? Avoid using heat on the injection site. ? Do not take baths, swim, or use a hot tub until your health care provider approves. Ask your health care provider if you may take a shower. You may only be allowed to take sponge baths.  If directed, put ice on the injection site. To do this: ? Put ice in a plastic bag. ? Place a towel between your skin and the bag. ? Leave the ice on for 20 minutes, 2-3 times a day.  Activity  Do not drive for 24 hours if you were given a sedative during your procedure.  Return to your normal activities as told by your health care provider. Ask your  health care provider what activities are safe for you. General instructions  Your blood pressure, heart rate, breathing rate, and blood oxygen level will be monitored until you leave the hospital or clinic.  Your arm or leg may feel weak or numb for a few hours.  The injection site may feel sore.  Take over-the-counter and prescription medicines only as told by your health care provider.  Drink enough fluid to keep your urine pale yellow.  Keep all follow-up visits as told by your health care provider. This is important. Contact a health care provider if:  You have any of these signs of infection: ? Redness, swelling, or pain around your injection site. ? Fluid or blood coming from your injection site. ? Warmth coming from your injection site. ? Pus or a bad smell coming from your injection site. ? A fever.  You continue to   have pain and soreness around the injection site, even after taking over-the-counter pain medicine.  You have severe, sudden, or lasting nausea or vomiting. Get help right away if:  You have severe pain at the injection site that is not relieved by medicines.  You develop a severe headache or a stiff neck.  You become sensitive to light.  You have any new numbness or weakness in your legs or arms.  You lose control of your bladder or bowel movements.  You have trouble breathing. Summary  An epidural steroid injection is a shot of steroid medicine and numbing medicine that is given into the epidural space.  The shot helps relieve pain caused by an irritated or swollen nerve root.  You are more likely to benefit from this injection if your pain is strong and comes on suddenly rather than if you have had chronic pain. This information is not intended to replace advice given to you by your health care provider. Make sure you discuss any questions you have with your health care provider. Document Revised: 09/16/2018 Document Reviewed: 09/16/2018 Elsevier  Patient Education  2020 [employer]  

## 2019-09-02 ENCOUNTER — Telehealth: Payer: Self-pay | Admitting: *Deleted

## 2019-09-02 NOTE — Telephone Encounter (Signed)
Attempted to call for post procedure follow-up. Message left. 

## 2019-09-09 DIAGNOSIS — S93492D Sprain of other ligament of left ankle, subsequent encounter: Secondary | ICD-10-CM | POA: Diagnosis not present

## 2019-09-09 DIAGNOSIS — S93432D Sprain of tibiofibular ligament of left ankle, subsequent encounter: Secondary | ICD-10-CM | POA: Diagnosis not present

## 2019-09-09 DIAGNOSIS — M25372 Other instability, left ankle: Secondary | ICD-10-CM | POA: Diagnosis not present

## 2019-09-10 ENCOUNTER — Other Ambulatory Visit: Payer: Self-pay | Admitting: Podiatry

## 2019-09-16 ENCOUNTER — Other Ambulatory Visit: Payer: Self-pay

## 2019-09-16 ENCOUNTER — Encounter: Payer: Self-pay | Admitting: Anesthesiology

## 2019-09-16 ENCOUNTER — Encounter: Payer: Self-pay | Admitting: Podiatry

## 2019-09-23 ENCOUNTER — Other Ambulatory Visit: Payer: PPO

## 2019-09-23 NOTE — Anesthesia Preprocedure Evaluation (Deleted)
Anesthesia Evaluation    Airway        Dental  (+) Partial Upper   Pulmonary former smoker,           Cardiovascular hypertension,    HLD   Neuro/Psych PSYCHIATRIC DISORDERS Anxiety Depression    GI/Hepatic   Endo/Other    Renal/GU      Musculoskeletal  (+) Arthritis , Osteoarthritis,  Fibromyalgia -  Abdominal   Peds  Hematology   Anesthesia Other Findings   Reproductive/Obstetrics                             Anesthesia Physical Anesthesia Plan  ASA: II  Anesthesia Plan: General   Post-op Pain Management:    Induction: Intravenous  PONV Risk Score and Plan: 3 and Treatment may vary due to age or medical condition, Ondansetron and Dexamethasone  Airway Management Planned: LMA  Additional Equipment:   Intra-op Plan:   Post-operative Plan: Extubation in OR  Informed Consent: I have reviewed the patients History and Physical, chart, labs and discussed the procedure including the risks, benefits and alternatives for the proposed anesthesia with the patient or authorized representative who has indicated his/her understanding and acceptance.       Plan Discussed with: CRNA and Anesthesiologist  Anesthesia Plan Comments:         Anesthesia Quick Evaluation

## 2019-09-23 NOTE — Discharge Instructions (Signed)
[facility]  POST OPERATIVE INSTRUCTIONS FOR DR. TROXLER, DR. FOWLER, AND DR. [facility]OD[facility]   1. Take your medication as prescribed.  Pain medication should be taken only as needed.  2. Keep the dressing clean, dry and intact.  3. Keep your foot elevated above the heart level for the first 48 hours.  4. Walking to the bathroom and brief periods of walking are acceptable, unless we have instructed you to be non-weight bearing.  5. Always wear your post-op shoe when walking.  Always use your crutches if you are to be non-weight bearing.  6. Do not take a shower. Baths are permissible as long as the foot is kept out of the water.   7. Every hour you are awake:  - Bend your knee 15 times. - Flex foot 15 times - Massage calf 15 times  8. Call [facility] ([phone]) if any of the following problems occur: - You develop a temperature or fever. - The bandage becomes saturated with blood. - Medication does not stop your pain. - Injury of the foot occurs. - Any symptoms of infection including redness, odor, or red streaks running from wound.   General Anesthesia, Adult, Care After This sheet gives you information about how to care for yourself after your procedure. Your health care provider may also give you more specific instructions. If you have problems or questions, contact your health care provider. What can I expect after the procedure? After the procedure, the following side effects are common:  Pain or discomfort at the IV site.  Nausea.  Vomiting.  Sore throat.  Trouble concentrating.  Feeling cold or chills.  Weak or tired.  Sleepiness and fatigue.  Soreness and body aches. These side effects can affect parts of the body that were not involved in surgery. Follow these instructions at home:  For at least 24 hours after the procedure:  Have a responsible adult stay with you. It is  important to have someone help care for you until you are awake and alert.  Rest as needed.  Do not: ? Participate in activities in which you could fall or become injured. ? Drive. ? Use heavy machinery. ? Drink alcohol. ? Take sleeping pills or medicines that cause drowsiness. ? Make important decisions or sign legal documents. ? Take care of children on your own. Eating and drinking  Follow any instructions from your health care provider about eating or drinking restrictions.  When you feel hungry, start by eating small amounts of foods that are soft and easy to digest (bland), such as toast. Gradually return to your regular diet.  Drink enough fluid to keep your urine pale yellow.  If you vomit, rehydrate by drinking water, juice, or clear broth. General instructions  If you have sleep apnea, surgery and certain medicines can increase your risk for breathing problems. Follow instructions from your health care provider about wearing your sleep device: ? Anytime you are sleeping, including during daytime naps. ? While taking prescription pain medicines, sleeping medicines, or medicines that make you drowsy.  Return to your normal activities as told by your health care provider. Ask your health care provider what activities are safe for you.  Take over-the-counter and prescription medicines only as told by your health care provider.  If you smoke, do not smoke without supervision.  Keep all follow-up visits as told by your health care provider. This is important. Contact a health care provider if:    You have nausea or vomiting that does not get better with medicine.  You cannot eat or drink without vomiting.  You have pain that does not get better with medicine.  You are unable to pass urine.  You develop a skin rash.  You have a fever.  You have redness around your IV site that gets worse. Get help right away if:  You have difficulty breathing.  You have chest  pain.  You have blood in your urine or stool, or you vomit blood. Summary  After the procedure, it is common to have a sore throat or nausea. It is also common to feel tired.  Have a responsible adult stay with you for the first 24 hours after general anesthesia. It is important to have someone help care for you until you are awake and alert.  When you feel hungry, start by eating small amounts of foods that are soft and easy to digest (bland), such as toast. Gradually return to your regular diet.  Drink enough fluid to keep your urine pale yellow.  Return to your normal activities as told by your health care provider. Ask your health care provider what activities are safe for you. This information is not intended to replace advice given to you by your health care provider. Make sure you discuss any questions you have with your health care provider. Document Revised: 03/09/2017 Document Reviewed: 10/20/2016 Elsevier Patient Education  2020 [employer]  

## 2019-09-24 ENCOUNTER — Other Ambulatory Visit: Payer: Self-pay

## 2019-09-24 ENCOUNTER — Emergency Department: Payer: PPO

## 2019-09-24 ENCOUNTER — Encounter: Payer: Self-pay | Admitting: *Deleted

## 2019-09-24 ENCOUNTER — Ambulatory Visit: Admission: RE | Admit: 2019-09-24 | Payer: PPO | Source: Home / Self Care | Admitting: Podiatry

## 2019-09-24 DIAGNOSIS — K5900 Constipation, unspecified: Secondary | ICD-10-CM | POA: Insufficient documentation

## 2019-09-24 DIAGNOSIS — K5730 Diverticulosis of large intestine without perforation or abscess without bleeding: Secondary | ICD-10-CM | POA: Diagnosis not present

## 2019-09-24 DIAGNOSIS — Z79899 Other long term (current) drug therapy: Secondary | ICD-10-CM | POA: Insufficient documentation

## 2019-09-24 DIAGNOSIS — K8020 Calculus of gallbladder without cholecystitis without obstruction: Secondary | ICD-10-CM | POA: Insufficient documentation

## 2019-09-24 DIAGNOSIS — I10 Essential (primary) hypertension: Secondary | ICD-10-CM | POA: Diagnosis not present

## 2019-09-24 DIAGNOSIS — R109 Unspecified abdominal pain: Secondary | ICD-10-CM | POA: Diagnosis not present

## 2019-09-24 DIAGNOSIS — E785 Hyperlipidemia, unspecified: Secondary | ICD-10-CM | POA: Diagnosis not present

## 2019-09-24 DIAGNOSIS — K5790 Diverticulosis of intestine, part unspecified, without perforation or abscess without bleeding: Secondary | ICD-10-CM | POA: Insufficient documentation

## 2019-09-24 DIAGNOSIS — Z87891 Personal history of nicotine dependence: Secondary | ICD-10-CM | POA: Insufficient documentation

## 2019-09-24 HISTORY — DX: Presence of dental prosthetic device (complete) (partial): Z97.2

## 2019-09-24 HISTORY — DX: Presence of spectacles and contact lenses: Z97.3

## 2019-09-24 HISTORY — DX: Presence of external hearing-aid: Z97.4

## 2019-09-24 LAB — COMPREHENSIVE METABOLIC PANEL
ALT: 34 U/L (ref 0–44)
AST: 30 U/L (ref 15–41)
Albumin: 4.3 g/dL (ref 3.5–5.0)
Alkaline Phosphatase: 57 U/L (ref 38–126)
Anion gap: 15 (ref 5–15)
BUN: 19 mg/dL (ref 8–23)
CO2: 25 mmol/L (ref 22–32)
Calcium: 9.1 mg/dL (ref 8.9–10.3)
Chloride: 97 mmol/L — ABNORMAL LOW (ref 98–111)
Creatinine, Ser: 0.97 mg/dL (ref 0.44–1.00)
GFR calc Af Amer: >60 mL/min (ref >60)
GFR calc non Af Amer: 58 mL/min — ABNORMAL LOW (ref >60)
Glucose, Bld: 106 mg/dL — ABNORMAL HIGH (ref 70–99)
Potassium: 3.4 mmol/L — ABNORMAL LOW (ref 3.5–5.1)
Sodium: 137 mmol/L (ref 135–145)
Total Bilirubin: 1 mg/dL (ref 0.3–1.2)
Total Protein: 7.7 g/dL (ref 6.5–8.1)

## 2019-09-24 LAB — CBC
HCT: 40.4 % (ref 36.0–46.0)
Hemoglobin: 14.6 g/dL (ref 12.0–15.0)
MCH: 31.8 pg (ref 26.0–34.0)
MCHC: 36.1 g/dL — ABNORMAL HIGH (ref 30.0–36.0)
MCV: 88 fL (ref 80.0–100.0)
Platelets: 232 10*3/uL (ref 150–400)
RBC: 4.59 MIL/uL (ref 3.87–5.11)
RDW: 14 % (ref 11.5–15.5)
WBC: 10.3 10*3/uL (ref 4.0–10.5)
nRBC: 0 % (ref 0.0–0.2)

## 2019-09-24 LAB — LIPASE, BLOOD: Lipase: 31 U/L (ref 11–51)

## 2019-09-24 SURGERY — ARTHROSCOPY, ANKLE
Anesthesia: Choice | Site: Ankle | Laterality: Left

## 2019-09-24 MED ORDER — SODIUM CHLORIDE 0.9% FLUSH: 3.0000 mL | Freq: Once | INTRAVENOUS | Status: DC

## 2019-09-24 NOTE — ED Triage Notes (Signed)
Pt to triage via wheelchair.  Pt reports no BM for 4 days.  Pt taking otc meds without relief.  No enema.  Pt reports vomiting bile 4 days ago.  Pt alert   Speech clear.

## 2019-09-25 ENCOUNTER — Emergency Department: Payer: PPO

## 2019-09-25 ENCOUNTER — Emergency Department
Admission: EM | Admit: 2019-09-25 | Discharge: 2019-09-25 | Disposition: A | Discharge status: Home / Self Care | Payer: PPO | Attending: Emergency Medicine | Admitting: Emergency Medicine

## 2019-09-25 DIAGNOSIS — K5730 Diverticulosis of large intestine without perforation or abscess without bleeding: Secondary | ICD-10-CM | POA: Diagnosis not present

## 2019-09-25 DIAGNOSIS — K5900 Constipation, unspecified: Secondary | ICD-10-CM

## 2019-09-25 DIAGNOSIS — K5790 Diverticulosis of intestine, part unspecified, without perforation or abscess without bleeding: Secondary | ICD-10-CM

## 2019-09-25 DIAGNOSIS — K8020 Calculus of gallbladder without cholecystitis without obstruction: Secondary | ICD-10-CM

## 2019-09-25 MED ORDER — IOHEXOL 300 MG/ML  SOLN
100.0000 mL | Freq: Once | INTRAMUSCULAR | Status: AC | PRN
  Administered 2019-09-25: 100 mL via INTRAVENOUS
  Filled 2019-09-25: qty 100

## 2019-09-25 MED ORDER — LACTULOSE 10 GM/15ML PO SOLN
20.0000 g | Freq: Once | ORAL | Status: AC
  Administered 2019-09-25: 20 g via ORAL
  Filled 2019-09-25: qty 30

## 2019-09-25 MED ORDER — ONDANSETRON HCL 4 MG/2ML IJ SOLN
4.0000 mg | Freq: Once | INTRAMUSCULAR | Status: AC
  Administered 2019-09-25: 4 mg via INTRAVENOUS
  Filled 2019-09-25: qty 2

## 2019-09-25 MED ORDER — MORPHINE SULFATE (PF) 2 MG/ML IV SOLN
2.0000 mg | Freq: Once | INTRAVENOUS | Status: AC
  Administered 2019-09-25: 2 mg via INTRAVENOUS
  Filled 2019-09-25: qty 1

## 2019-09-25 NOTE — ED Provider Notes (Signed)
[facility] Emergency Department Provider Note  ____________________________________________   First MD Initiated Contact with Patient 09/25/19 0023     (approximate)  I have reviewed the triage vital signs and the nursing notes.   HISTORY  Chief Complaint Constipation    HPI Carol [name] is a 74 y.o. female with below list of previous medical conditions presents to the emergency department secondary to a 4-day history of generalized abdominal pain and constipation .  Patient denies any fever.  Patient denies any urinary symptoms.  Patient does admit to nausea with one episode of bilious vomiting 4 days ago.  Patient states that she tried over-the-counter magnesium citrate for constipation without any bowel movement.        Past Medical History:  Diagnosis Date  . Anxiety   . Arthritis   . Depression   . Headache    migraine - last one 2016  . Hypertension    reason that she was given Hygroton & she remarks that her BP has been better she has been taking it.   Marland Kitchen Positive skin test for tuberculosis    told that she is a carrier, (treated age 64 and as adult) Negative x-ray(per pt)  . Wears contact lenses   . Wears dentures    partial upper  . Wears hearing aid in right ear     Patient Active Problem List   Diagnosis Date Noted  . Chronic radicular lumbar pain 09/01/2019  . Chronic pain syndrome 09/01/2019  . Leg pain, left 10/03/2017  . Obesity (BMI 30.0-34.9) 05/25/2017  . Acquired genu varum 11/30/2016  . Depression 08/25/2016  . Vitamin D deficiency 07/20/2016  . B12 deficiency 07/20/2016  . Gait instability 07/19/2016  . Hypertension 07/19/2016  . Hyperlipidemia 07/19/2016  . Osteopenia of spine 07/19/2016  . History of lumbar fusion 07/19/2016  . Hx of fusion of cervical spine 07/19/2016  . Migraine headache 07/19/2016  . Anxiety disorder 07/19/2016  . Allergic rhinitis 07/19/2016  . Constipation 07/19/2016  . Lung nodules  07/19/2016  . History of positive PPD 07/19/2016  . Patent pressure equalization (PE) tube on right side 07/19/2016  . Varicose veins of both lower extremities with pain 05/17/2016  . Chronic venous insufficiency 05/17/2016  . Degenerative spondylolisthesis 03/21/2016  . De Quervain's disease (radial styloid tenosynovitis) 03/07/2015  . Ganglion cyst of dorsum of right wrist 03/07/2015  . Status post left partial knee replacement 04/07/2014  . Fibromyalgia 02/26/2013  . OA (osteoarthritis) 11/21/2012    Past Surgical History:  Procedure Laterality Date  . ABDOMINAL HYSTERECTOMY    . BACK SURGERY     x3 previous cervical fusion & then removed hardware   . BREAST SURGERY Right    benign-   . HAND TENDON SURGERY Right 2016  . JOINT REPLACEMENT Right    also had a partial on the L   . LUMBAR FUSION    . MYRINGOTOMY WITH TUBE PLACEMENT Right   . TONSILLECTOMY    . TUBAL LIGATION     had a 2nd. surgery for adhesions    Prior to Admission medications   Medication Sig Start Date End Date Taking? Authorizing Provider  alendronate (FOSAMAX) 70 MG tablet Take 70 mg by mouth daily as needed. 08/01/19 07/31/20  [provider]  azelastine (ASTELIN) 0.1 % nasal spray Place 1 spray into the nose as needed. 06/04/17   [provider]  Calcium Carbonate-Vitamin D (CALTRATE 600+D PO) Take by mouth daily.  [provider]  cetirizine (ZYRTEC) 10 MG tablet Take 10 mg by mouth daily.    [provider]  chlorthalidone (HYGROTON) 25 MG tablet Take 1 tablet (25 mg total) by mouth daily before breakfast. Patient taking differently: Take 50 mg by mouth daily before breakfast.  09/25/16   Plonk, Chrissie Noa, MD  clindamycin (CLEOCIN) 150 MG capsule TAKE FOUR TABS PRIOR TO DENTAL APPOINMENT 03/07/16   [provider]  clonazePAM (KLONOPIN) 0.5 MG tablet Take 10 mg by mouth as needed. 08/14/19   [provider]  diazepam (VALIUM) 5 MG tablet Take 5 mg by  mouth daily as needed for anxiety.    [provider]  diclofenac Sodium (VOLTAREN) 1 % GEL Apply 1 application topically as needed. 05/09/12   [provider]  Docusate Sodium 100 MG/5ML ENEM Take 1 tablet by mouth daily as needed.    [provider]  FLUoxetine (PROZAC) 20 MG capsule Take 20 mg by mouth daily. 08/23/19   [provider]  fluticasone (FLONASE) 50 MCG/ACT nasal spray Place 2 sprays into both nostrils daily as needed for allergies.     [provider]  ipratropium (ATROVENT) 0.03 % nasal spray Place 1 spray into the nose as needed. 06/09/19 06/08/20  [provider]  meloxicam (MOBIC) 15 MG tablet Take 15 mg by mouth daily. 01/05/19   [provider]  potassium chloride (KLOR-CON) 10 MEQ tablet Take 10 mEq by mouth in the morning and at bedtime. 02/04/19 02/04/20  [provider]  Psyllium-Calcium (FIBER PLUS CALCIUM PO) Take 1 tablet by mouth in the morning and at bedtime.    [provider]  pyridOXINE (VITAMIN B-6) 100 MG tablet Take 100 mg by mouth daily.    [provider]  rizatriptan (MAXALT) 10 MG tablet Take 10 mg by mouth as needed for migraine.  Patient not taking: Reported on 09/16/2019    [provider]  rosuvastatin (CRESTOR) 5 MG tablet Take 5 mg by mouth daily. 11/19/17   [provider]  SUMAtriptan (IMITREX) 100 MG tablet Take 1 tablet (100 mg total) by mouth daily as needed for migraine. May repeat in 2 hours if headache persists or recurs. Patient not taking: Reported on 09/16/2019 11/23/16   Schuyler Amor, MD  traZODone (DESYREL) 50 MG tablet Take 50 mg by mouth at bedtime. 07/01/19 06/30/20  [provider]  Vitamin D, Cholecalciferol, 50 MCG (2000 UT) CAPS Take 1 tablet by mouth daily.    [provider]    Allergies Gentamicin, Hydralazine, Hydrocodone, Ibandronic acid, and Penicillins  Family History  Problem Relation Age of Onset  .  Aneurysm Mother   . Lung cancer Brother     Social History Social History   Tobacco Use  . Smoking status: Former Smoker    Types: Cigarettes    Quit date: 03/15/1976    Years since quitting: 43.5  . Smokeless tobacco: Never Used  Vaping Use  . Vaping Use: Former  Substance Use Topics  . Alcohol use: No  . Drug use: Yes    Frequency: 1.0 times per week    Types: Marijuana    Comment: occasionally     Review of Systems Constitutional: No fever/chills Eyes: No visual changes. ENT: No sore throat. Cardiovascular: Denies chest pain. Respiratory: Denies shortness of breath. Gastrointestinal: Positive for abdominal pain and constipation. Genitourinary: Negative for dysuria. Musculoskeletal: Negative for neck pain.  Negative for back pain. Integumentary: Negative for rash. Neurological: Negative for headaches,  focal weakness or numbness.   ____________________________________________   PHYSICAL EXAM:  VITAL SIGNS: ED Triage Vitals  Enc Vitals Group     BP 09/24/19 2002 (!) 147/78     Pulse Rate 09/24/19 2002 88     Resp 09/24/19 2002 18     Temp 09/24/19 2002 (!) 97.5 F (36.4 C)     Temp Source 09/24/19 2002 Oral     SpO2 09/24/19 2002 99 %     Weight 09/24/19 2002 81.6 kg (180 lb)     Height 09/24/19 2002 1.6 m (5\' 3" )     Head Circumference --      Peak Flow --      Pain Score 09/24/19 2001 8     Pain Loc --      Pain Edu? --      Excl. in GC? --     Constitutional: Alert and oriented.  Eyes: Conjunctivae are normal.  Mouth/Throat: Patient is wearing a mask. Neck: No stridor.  No meningeal signs.   Cardiovascular: Normal rate, regular rhythm. Good peripheral circulation. Grossly normal heart sounds. Respiratory: Normal respiratory effort.  No retractions. Gastrointestinal: Right upper quadrant pain to palpation.  No guarding no rebound Musculoskeletal: No lower extremity tenderness nor edema. No gross deformities of extremities. Neurologic:  Normal  speech and language. No gross focal neurologic deficits are appreciated.  Skin:  Skin is warm, dry and intact. Psychiatric: Mood and affect are normal. Speech and behavior are normal.  ____________________________________________   LABS (all labs ordered are listed, but only abnormal results are displayed)  Labs Reviewed  COMPREHENSIVE METABOLIC PANEL - Abnormal; Notable for the following components:      Result Value   Potassium 3.4 (*)    Chloride 97 (*)    Glucose, Bld 106 (*)    GFR calc non Af Amer 58 (*)    All other components within normal limits  CBC - Abnormal; Notable for the following components:   MCHC 36.1 (*)    All other components within normal limits  LIPASE, BLOOD     RADIOLOGY I, [place] N Evelynne Spiers, personally viewed and evaluated these images (plain radiographs) as part of my medical decision making, as well as reviewing the written report by the radiologist.  ED MD interpretation: No acute cardiopulmonary process noted on chest x-ray per radiologist.   CT abdomen pelvis revealed cholelithiasis without any complicating factor.  Diverticulosis without evidence of diverticulitis.  Official radiology report(s): CT ABDOMEN PELVIS W CONTRAST  Addendum Date: 09/25/2019   ADDENDUM REPORT: 09/25/2019 02:39 ADDENDUM: Impression should also read: Cholelithiasis without complicating factors. Electronically Signed   By: 11/26/2019 M.D.   On: 09/25/2019 02:39   Result Date: 09/25/2019 CLINICAL DATA:  Acute abdominal pain EXAM: CT ABDOMEN AND PELVIS WITH CONTRAST TECHNIQUE: Multidetector CT imaging of the abdomen and pelvis was performed using the standard protocol following bolus administration of intravenous contrast. CONTRAST:  11/26/2019 OMNIPAQUE IOHEXOL 300 MG/ML  SOLN COMPARISON:  09/24/2019 FINDINGS: Lower chest: No acute abnormality. Hepatobiliary: Fatty infiltration of the liver is noted. The gallbladder demonstrates multiple gallstones without complicating factors.  Pancreas: Unremarkable. No pancreatic ductal dilatation or surrounding inflammatory changes. Spleen: Normal in size without focal abnormality. Adrenals/Urinary Tract: Adrenal glands are within normal limits. The kidneys demonstrate a normal enhancement pattern. Delayed images demonstrate normal excretion of contrast material. The bladder is decompressed. Stomach/Bowel: Colon shows diffuse diverticular change without definitive diverticulitis. The appendix is not well visualized. No inflammatory changes to suggest appendicitis  are seen. Small bowel and stomach appear within normal limits. Vascular/Lymphatic: Atherosclerotic calcifications of the aorta are noted. No significant lymphadenopathy is seen. Reproductive: Status post hysterectomy. No adnexal masses. Other: No abdominal wall hernia or abnormality. No abdominopelvic ascites. Musculoskeletal: Postsurgical changes are noted in the lower lumbar spine. IMPRESSION: Fatty liver. Diverticulosis without diverticulitis. Chronic changes as described above. Electronically Signed: By: Alcide CleverMark  Lukens M.D. On: 09/25/2019 02:27   DG Abdomen Acute W/Chest  Result Date: 09/24/2019 CLINICAL DATA:  74 year old female with abdominal cramping. EXAM: DG ABDOMEN ACUTE W/ 1V CHEST COMPARISON:  None. FINDINGS: No focal consolidation, pleural effusion, or pneumothorax. There is a 9 mm right mid to lower lung field nodule, present on the prior radiograph of 2018, possibly a granuloma. The cardiac silhouette is within limits. There is no bowel dilatation or evidence of obstruction. Air is noted throughout the colon. Multiple gallstones noted. Partially visualized lower cervical ACDF and multilevel lumbar fusion hardware. No acute osseous pathology. IMPRESSION: 1. No acute cardiopulmonary process. 2. No evidence of bowel obstruction. 3. Cholelithiasis. 4. A 9 mm nodule in the right lower lung field, present on the radiograph of 2018. Electronically Signed   By: Elgie CollardArash  Radparvar M.D.    On: 09/24/2019 20:40     Procedures   ____________________________________________   INITIAL IMPRESSION / MDM / ASSESSMENT AND PLAN / ED COURSE  As part of my medical decision making, I reviewed the following data within the electronic MEDICAL RECORD NUMBER   74 year old female presented with above-stated history and physical exam with differential diagnosis including but not limited to cholelithiasis, cholecystitis, diverticulitis, constipation.  Laboratory data unremarkable.  CT abdomen pelvis revealed evidence of cholelithiasis without any complicating factors.  Diverticulosis without evidence of diverticulitis.  Patient will be referred to general surgery for further outpatient evaluation and management.  ____________________________________________  FINAL CLINICAL IMPRESSION(S) / ED DIAGNOSES  Final diagnoses:  Gallstones  Diverticulosis  Constipation, unspecified constipation type     MEDICATIONS GIVEN DURING THIS VISIT:  Medications  sodium chloride flush (NS) 0.9 % injection 3 mL (has no administration in time range)  morphine 2 MG/ML injection 2 mg (2 mg Intravenous Given 09/25/19 0041)  ondansetron (ZOFRAN) injection 4 mg (4 mg Intravenous Given 09/25/19 0041)  iohexol (OMNIPAQUE) 300 MG/ML solution 100 mL (100 mLs Intravenous Contrast Given 09/25/19 0145)  lactulose (CHRONULAC) 10 GM/15ML solution 20 g (20 g Oral Provided for home use 09/25/19 0259)     ED Discharge Orders    None      *Please note:  Beverlee NimsJanet S Shouse was evaluated in Emergency Department on 09/25/2019 for the symptoms described in the history of present illness. She was evaluated in the context of the global COVID-19 pandemic, which necessitated consideration that the patient might be at risk for infection with the SARS-CoV-2 virus that causes COVID-19. Institutional protocols and algorithms that pertain to the evaluation of patients at risk for COVID-19 are in a state of rapid change based on information  released by regulatory bodies including the CDC and federal and state organizations. These policies and algorithms were followed during the patient's care in the ED.  Some ED evaluations and interventions may be delayed as a result of limited staffing during and after the pandemic.*  Note:  This document was prepared using Dragon voice recognition software and may include unintentional dictation errors.   Darci CurrentBrown, [place], MD 09/25/19 [phone]

## 2019-10-01 ENCOUNTER — Other Ambulatory Visit: Payer: Self-pay

## 2019-10-01 ENCOUNTER — Ambulatory Visit (INDEPENDENT_AMBULATORY_CARE_PROVIDER_SITE_OTHER): Payer: PPO | Admitting: Surgery

## 2019-10-01 ENCOUNTER — Encounter: Payer: Self-pay | Admitting: Surgery

## 2019-10-01 ENCOUNTER — Telehealth: Payer: Self-pay

## 2019-10-01 VITALS — BP 138/81 | HR 128 | Temp 98.6°F | Ht 63.0 in | Wt 180.0 lb

## 2019-10-01 DIAGNOSIS — K8020 Calculus of gallbladder without cholecystitis without obstruction: Secondary | ICD-10-CM | POA: Diagnosis not present

## 2019-10-01 NOTE — Progress Notes (Signed)
10/01/2019  Reason for Visit:  Symptomatic cholelithiasis  History of Present Illness: Carol [name] is a 74 y.o. female presenting for evaluation of symptomatic cholelithiasis.  She presented to the ED on 7/8 with a 4 day history of constipation and bloatedness, associated also with RUQ pain, nausea, and vomiting.  She denies any fevers or chills, chest pain, shortness of breath.  In the emergency room, the patient had normal lab work with a WBC of 10.3, and normal LFTs.  She did have a CT scan of the abdomen and pelvis with contrast which showed cholelithiasis with no evidence of cholecystitis as well as diverticulosis with no evidence of diverticulitis.  She was given lactulose in the emergency room which worked had a bowel movement.  She was discharged to home with follow-up with [place].  After asking the patient further about prior episodes, she believes that she has had more minor episodes in the past and her cousin who comes with her today to the office, says that this has been ongoing for about 2 years now.  However this past episode was the most severe.  For her constipation, the patient takes fiber as well as stool softeners.  After the lactulose, she has been having somewhat more regular movement.  Of note, the patient was scheduled to have surgery with Dr. Ether Griffins for left ankle but it was canceled as the patient presented to the emergency room instead.  Past Medical History: Past Medical History:  Diagnosis Date  . Anxiety   . Arthritis   . Depression   . Headache    migraine - last one 2016  . Hypertension    reason that she was given Hygroton & she remarks that her BP has been better she has been taking it.   Marland Kitchen Positive skin test for tuberculosis    told that she is a carrier, (treated age 89 and as adult) Negative x-ray(per pt)  . Wears contact lenses   . Wears dentures    partial upper  . Wears hearing aid in right ear      Past Surgical History: Past Surgical History:   Procedure Laterality Date  . ABDOMINAL HYSTERECTOMY    . BACK SURGERY     x3 previous cervical fusion & then removed hardware   . BREAST SURGERY Right    benign-   . HAND TENDON SURGERY Right 2016  . JOINT REPLACEMENT Right    also had a partial on the L   . LUMBAR FUSION    . MYRINGOTOMY WITH TUBE PLACEMENT Right   . TONSILLECTOMY    . TUBAL LIGATION     had a 2nd. surgery for adhesions    Home Medications: Prior to Admission medications   Medication Sig Start Date End Date Taking? Authorizing Provider  alendronate (FOSAMAX) 70 MG tablet Take 70 mg by mouth daily as needed. 08/01/19 07/31/20 Yes [provider]  azelastine (ASTELIN) 0.1 % nasal spray Place 1 spray into the nose as needed. 06/04/17  Yes [provider]  Calcium Carbonate-Vitamin D (CALTRATE 600+D PO) Take by mouth daily.   Yes [provider]  cetirizine (ZYRTEC) 10 MG tablet Take 10 mg by mouth daily.   Yes [provider]  chlorthalidone (HYGROTON) 25 MG tablet Take 1 tablet (25 mg total) by mouth daily before breakfast. Patient taking differently: Take 50 mg by mouth daily before breakfast.  09/25/16  Yes Plonk, Chrissie Noa, MD  clindamycin (CLEOCIN) 150 MG capsule TAKE FOUR TABS PRIOR TO  DENTAL APPOINMENT 03/07/16  Yes [provider]  clonazePAM (KLONOPIN) 0.5 MG tablet Take 10 mg by mouth as needed. 08/14/19  Yes [provider]  diclofenac Sodium (VOLTAREN) 1 % GEL Apply 1 application topically as needed. 05/09/12  Yes [provider]  Docusate Sodium 100 MG/5ML ENEM Take 1 tablet by mouth daily as needed.   Yes [provider]  FLUoxetine (PROZAC) 20 MG capsule Take 20 mg by mouth daily. 08/23/19  Yes [provider]  fluticasone (FLONASE) 50 MCG/ACT nasal spray Place 2 sprays into both nostrils daily as needed for allergies.    Yes [provider]  ipratropium (ATROVENT) 0.03 % nasal spray Place 1 spray into the nose as needed.  06/09/19 06/08/20 Yes [provider]  meloxicam (MOBIC) 15 MG tablet Take 15 mg by mouth daily. 01/05/19  Yes [provider]  potassium chloride (KLOR-CON) 10 MEQ tablet Take 10 mEq by mouth in the morning and at bedtime. 02/04/19 02/04/20 Yes [provider]  Psyllium-Calcium (FIBER PLUS CALCIUM PO) Take 1 tablet by mouth in the morning and at bedtime.   Yes [provider]  pyridOXINE (VITAMIN B-6) 100 MG tablet Take 100 mg by mouth daily.   Yes [provider]  rizatriptan (MAXALT) 10 MG tablet Take 10 mg by mouth as needed for migraine.    Yes [provider]  rosuvastatin (CRESTOR) 5 MG tablet Take 5 mg by mouth daily. 11/19/17  Yes [provider]  SUMAtriptan (IMITREX) 100 MG tablet Take 1 tablet (100 mg total) by mouth daily as needed for migraine. May repeat in 2 hours if headache persists or recurs. 11/23/16  Yes Plonk, Chrissie Noa, MD  traZODone (DESYREL) 50 MG tablet Take 50 mg by mouth at bedtime. 07/01/19 06/30/20 Yes [provider]  Vitamin D, Cholecalciferol, 50 MCG (2000 UT) CAPS Take 1 tablet by mouth daily.   Yes [provider]  diazepam (VALIUM) 5 MG tablet Take 5 mg by mouth daily as needed for anxiety. Patient not taking: Reported on 10/01/2019    [provider]  FLUoxetine (PROZAC) 10 MG capsule  08/14/19   [provider]    Allergies: Allergies  Allergen Reactions  . Gentamicin Rash, Itching and Other (See Comments)  . Hydralazine Nausea And Vomiting  . Hydrocodone Itching  . Ibandronic Acid Other (See Comments)    cracked teeth and joint pain  . Penicillins Hives and Other (See Comments)    Has patient had a PCN reaction causing immediate rash, facial/tongue/throat swelling, SOB or lightheadedness with hypotension: No Has patient had a PCN reaction causing severe rash involving mucus membranes or skin necrosis: Yes Has patient had a PCN reaction that required hospitalization  No Has patient had a PCN reaction occurring within the last 10 years: No If all of the above answers are "NO", then may proceed with Cephalosporin use.     Social History:  reports that she quit smoking about 43 years ago. Her smoking use included cigarettes. She has never used smokeless tobacco. She reports current drug use. Frequency: 1.00 time per week. Drug: Marijuana. She reports that she does not drink alcohol.   Family History: Family History  Problem Relation Age of Onset  . Aneurysm Mother   . Lung cancer Brother     Review of Systems: Review of Systems  Constitutional: Negative for chills and fever.  HENT: Negative for hearing loss.   Respiratory: Negative for shortness of breath.   Cardiovascular: Negative for  chest pain.  Gastrointestinal: Positive for abdominal pain, constipation, nausea and vomiting. Negative for blood in stool and diarrhea.  Genitourinary: Negative for dysuria.  Musculoskeletal: Positive for back pain. Negative for myalgias.  Skin: Negative for rash.  Neurological: Negative for dizziness.  Psychiatric/Behavioral: Negative for depression.    Physical Exam BP 138/81   Pulse (!) 128   Temp 98.6 F (37 C) (Oral)   Ht 5\' 3"  (1.6 m)   Wt 180 lb (81.6 kg)   SpO2 96%   BMI 31.89 kg/m  CONSTITUTIONAL: No acute distress HEENT:  Normocephalic, atraumatic, extraocular motion intact. NECK: Trachea is midline, and there is no jugular venous distension.  RESPIRATORY:  Lungs are clear, and breath sounds are equal bilaterally. Normal respiratory effort without pathologic use of accessory muscles. CARDIOVASCULAR: Heart is regular rhythm although appears having a low-grade tachycardia. GI: The abdomen is soft, nondistended, with mild tenderness to palpation in the epigastric and right upper quadrant areas.  Negative Murphy's sign today.  MUSCULOSKELETAL:  Normal muscle strength and tone in all four extremities.  No peripheral edema or cyanosis. SKIN: Skin  turgor is normal. There are no pathologic skin lesions.  NEUROLOGIC:  Motor and sensation is grossly normal.  Cranial nerves are grossly intact. PSYCH:  Alert and oriented to person, place and time. Affect is normal.  Laboratory Analysis: Labs from 09/24/2019: Sodium 137, potassium 3.4, chloride 97, CO2 25, BUN 19, creatinine 0.97.  Total bilirubin 1.0, AST 30, ALT 34, alkaline phosphatase 37, lipase 31.  WBC 10.3, hemoglobin 14.6, hematocrit 40.4, platelet 232.  Imaging: CT scan abdomen/pelvis 09/24/2019: IMPRESSION: Cholelithiasis without complicatingfactors. Fatty liver. Diverticulosis without diverticulitis. Chronic changes as described above.  Assessment and Plan: This is a 74 y.o. female with symptomatic cholelithiasis as well as chronic constipation.  -Discussed with the patient that for her constipation, she may also try using MiraLAX once daily.  This is a gentle laxative and should not cause any cramping pain.  She should continue with her fiber as well as stool softener as needed. -Also discussed with the patient the findings on her CT scan.  With respect to diverticulosis, there was no evidence of diverticulitis.  Encouraged her to continue high-fiber diet, but otherwise at this point, there is no contraindication to having foods with nuts, popcorn, seeds, etc. -The patient reports that she likely has had multiple minor episodes of this kind of pain that brought her to the emergency room in the past.  Her cousin agrees with this.  I think her presenting symptom most likely was symptomatic cholelithiasis, in addition to her being constipated.  Discussed with her that the 2 are not necessarily related but given her symptoms, I do think that proceeding with surgery for cholecystectomy is a reasonable option.  Discussed with her the role of robotic cholecystectomy with ICG cholangiogram.  Described the risks of bleeding, infection, and injury to surrounding structures, as well as the risk  for open surgery.  Discussed the postoperative recovery and restrictions.  She is willing to proceed.  We will tentatively schedule her for 10/14/2019. -With regards to her ankle surgery, will discuss with Dr. 10/16/2019 if it is possible to do a combined case so she only has to go through 1 anesthesia setting.  We may have to coordinate dates but I am happy to be flexible.  Face-to-face time spent with the patient and care providers was 45 minutes, with more than 50% of the time spent counseling, educating, and coordinating care of the patient.  Melvyn Neth, [facility]

## 2019-10-01 NOTE — Telephone Encounter (Signed)
Medical Clearance was faxed over to Myrene Buddy, Nurse Practitioner office at [phone].

## 2019-10-01 NOTE — Patient Instructions (Addendum)
Dr.Piscoya recommends patient to try over the counter Miralax every day to help with constipation. Medical Clearance was sent to PCP at today's visit as well.  Our surgery scheduler Britta Mccreedy will contact you within the next 24-48 hours. During that call, she will discuss the preparation prior to surgery and the different dates and times of surgery. Please have the BLUE sheet available when she contacts you. If you have any questions or concerns regarding to surgery to give our office a call. Laparoscopic Cholecystectomy Laparoscopic cholecystectomy is surgery to remove the gallbladder. The gallbladder is a pear-shaped organ that lies beneath the liver on the right side of the body. The gallbladder stores bile, which is a fluid that helps the body to digest fats. Cholecystectomy is often done for inflammation of the gallbladder (cholecystitis). This condition is usually caused by a buildup of gallstones (cholelithiasis) in the gallbladder. Gallstones can block the flow of bile, which can result in inflammation and pain. In severe cases, emergency surgery may be required. This procedure is done though small incisions in your abdomen (laparoscopic surgery). A thin scope with a camera (laparoscope) is inserted through one incision. Thin surgical instruments are inserted through the other incisions. In some cases, a laparoscopic procedure may be turned into a type of surgery that is done through a larger incision (open surgery). Tell a health care provider about:  Any allergies you have.  All medicines you are taking, including vitamins, herbs, eye drops, creams, and over-the-counter medicines.  Any problems you or family members have had with anesthetic medicines.  Any blood disorders you have.  Any surgeries you have had.  Any medical conditions you have.  Whether you are pregnant or may be pregnant. What are the risks? Generally, this is a safe procedure. However, problems may occur,  including:  Infection.  Bleeding.  Allergic reactions to medicines.  Damage to other structures or organs.  A stone remaining in the common bile duct. The common bile duct carries bile from the gallbladder into the small intestine.  A bile leak from the cyst duct that is clipped when your gallbladder is removed. What happens before the procedure? Staying hydrated Follow instructions from your health care provider about hydration, which may include:  Up to 2 hours before the procedure - you may continue to drink clear liquids, such as water, clear fruit juice, black coffee, and plain tea. Eating and drinking restrictions Follow instructions from your health care provider about eating and drinking, which may include:  8 hours before the procedure - stop eating heavy meals or foods such as meat, fried foods, or fatty foods.  6 hours before the procedure - stop eating light meals or foods, such as toast or cereal.  6 hours before the procedure - stop drinking milk or drinks that contain milk.  2 hours before the procedure - stop drinking clear liquids. Medicines  Ask your health care provider about: ? Changing or stopping your regular medicines. This is especially important if you are taking diabetes medicines or blood thinners. ? Taking medicines such as aspirin and ibuprofen. These medicines can thin your blood. Do not take these medicines before your procedure if your health care provider instructs you not to.  You may be given antibiotic medicine to help prevent infection. General instructions  Let your health care provider know if you develop a cold or an infection before surgery.  Plan to have someone take you home from the hospital or clinic.  Ask your health care  provider how your surgical site will be marked or identified. What happens during the procedure?   To reduce your risk of infection: ? Your health care team will wash or sanitize their hands. ? Your skin  will be washed with soap. ? Hair may be removed from the surgical area.  An IV tube may be inserted into one of your veins.  You will be given one or more of the following: ? A medicine to help you relax (sedative). ? A medicine to make you fall asleep (general anesthetic).  A breathing tube will be placed in your mouth.  Your surgeon will make several small cuts (incisions) in your abdomen.  The laparoscope will be inserted through one of the small incisions. The camera on the laparoscope will send images to a TV screen (monitor) in the operating room. This lets your surgeon see inside your abdomen.  Air-like gas will be pumped into your abdomen. This will expand your abdomen to give the surgeon more room to perform the surgery.  Other tools that are needed for the procedure will be inserted through the other incisions. The gallbladder will be removed through one of the incisions.  Your common bile duct may be examined. If stones are found in the common bile duct, they may be removed.  After your gallbladder has been removed, the incisions will be closed with stitches (sutures), staples, or skin glue.  Your incisions may be covered with a bandage (dressing). The procedure may vary among health care providers and hospitals. What happens after the procedure?  Your blood pressure, heart rate, breathing rate, and blood oxygen level will be monitored until the medicines you were given have worn off.  You will be given medicines as needed to control your pain.  Do not drive for 24 hours if you were given a sedative. This information is not intended to replace advice given to you by your health care provider. Make sure you discuss any questions you have with your health care provider. Document Revised: 02/16/2017 Document Reviewed: 08/23/2015 Elsevier Patient Education  2020 [employer]   Gallbladder Eating Plan If you have a gallbladder condition, you may have trouble digesting  fats. Eating a low-fat diet can help reduce your symptoms, and may be helpful before and after having surgery to remove your gallbladder (cholecystectomy). Your health care provider may recommend that you work with a diet and nutrition specialist (dietitian) to help you reduce the amount of fat in your diet. What are tips for following this plan? General guidelines  Limit your fat intake to less than 30% of your total daily calories. If you eat around 1,800 calories each day, this is less than 60 grams (g) of fat per day.  Fat is an important part of a healthy diet. Eating a low-fat diet can make it hard to maintain a healthy body weight. Ask your dietitian how much fat, calories, and other nutrients you need each day.  Eat small, frequent meals throughout the day instead of three large meals.  Drink at least 8-10 cups of fluid a day. Drink enough fluid to keep your urine clear or pale yellow.  Limit alcohol intake to no more than 1 drink a day for nonpregnant women and 2 drinks a day for men. One drink equals 12 oz of beer, 5 oz of wine, or 1 oz of hard liquor. Reading food labels  Check Nutrition Facts on food labels for the amount of fat per serving. Choose foods with less  than 3 grams of fat per serving. Shopping  Choose nonfat and low-fat healthy foods. Look for the words "nonfat," "low fat," or "fat free."  Avoid buying processed or prepackaged foods. Cooking  Cook using low-fat methods, such as baking, broiling, grilling, or boiling.  Cook with small amounts of healthy fats, such as olive oil, grapeseed oil, canola oil, or sunflower oil. What foods are recommended?   All fresh, frozen, or canned fruits and vegetables.  Whole grains.  Low-fat or non-fat (skim) milk and yogurt.  Lean meat, skinless poultry, fish, eggs, and beans.  Low-fat protein supplement powders or drinks.  Spices and herbs. What foods are not recommended?  High-fat foods. These include baked goods,  fast food, fatty cuts of meat, ice cream, french toast, sweet rolls, pizza, cheese bread, foods covered with butter, creamy sauces, or cheese.  Fried foods. These include french fries, tempura, battered fish, breaded chicken, fried breads, and sweets.  Foods with strong odors.  Foods that cause bloating and gas. Summary  A low-fat diet can be helpful if you have a gallbladder condition, or before and after gallbladder surgery.  Limit your fat intake to less than 30% of your total daily calories. This is about 60 g of fat if you eat 1,800 calories each day.  Eat small, frequent meals throughout the day instead of three large meals. This information is not intended to replace advice given to you by your health care provider. Make sure you discuss any questions you have with your health care provider. Document Revised: 06/27/2018 Document Reviewed: 04/13/2016 Elsevier Patient Education  2020 [employer].

## 2019-10-01 NOTE — H&P (View-Only) (Signed)
10/01/2019  Reason for Visit:  Symptomatic cholelithiasis  History of Present Illness: Carol [name] is a 74 y.o. female presenting for evaluation of symptomatic cholelithiasis.  She presented to the ED on 7/8 with a 4 day history of constipation and bloatedness, associated also with RUQ pain, nausea, and vomiting.  She denies any fevers or chills, chest pain, shortness of breath.  In the emergency room, the patient had normal lab work with a WBC of 10.3, and normal LFTs.  She did have a CT scan of the abdomen and pelvis with contrast which showed cholelithiasis with no evidence of cholecystitis as well as diverticulosis with no evidence of diverticulitis.  She was given lactulose in the emergency room which worked had a bowel movement.  She was discharged to home with follow-up with us.  After asking the patient further about prior episodes, she believes that she has had more minor episodes in the past and her cousin who comes with her today to the office, says that this has been ongoing for about 2 years now.  However this past episode was the most severe.  For her constipation, the patient takes fiber as well as stool softeners.  After the lactulose, she has been having somewhat more regular movement.  Of note, the patient was scheduled to have surgery with Dr. Fowler for left ankle but it was canceled as the patient presented to the emergency room instead.  Past Medical History: Past Medical History:  Diagnosis Date  . Anxiety   . Arthritis   . Depression   . Headache    migraine - last one 2016  . Hypertension    reason that she was given [place] & she remarks that her BP has been better she has been taking it.   . Positive skin test for tuberculosis    told that she is a carrier, (treated age 15 and as adult) Negative x-ray(per pt)  . Wears contact lenses   . Wears dentures    partial upper  . Wears hearing aid in right ear      Past Surgical History: Past Surgical History:   Procedure Laterality Date  . ABDOMINAL HYSTERECTOMY    . BACK SURGERY     x3 previous cervical fusion & then removed hardware   . BREAST SURGERY Right    benign-   . HAND TENDON SURGERY Right 2016  . JOINT REPLACEMENT Right    also had a partial on the L   . LUMBAR FUSION    . MYRINGOTOMY WITH TUBE PLACEMENT Right   . TONSILLECTOMY    . TUBAL LIGATION     had a 2nd. surgery for adhesions    Home Medications: Prior to Admission medications   Medication Sig Start Date End Date Taking? Authorizing Provider  alendronate (FOSAMAX) 70 MG tablet Take 70 mg by mouth daily as needed. 08/01/19 07/31/20 Yes [provider]  azelastine (ASTELIN) 0.1 % nasal spray Place 1 spray into the nose as needed. 06/04/17  Yes [provider]  Calcium Carbonate-Vitamin D (CALTRATE 600+D PO) Take by mouth daily.   Yes [provider]  cetirizine (ZYRTEC) 10 MG tablet Take 10 mg by mouth daily.   Yes [provider]  chlorthalidone (HYGROTON) 25 MG tablet Take 1 tablet (25 mg total) by mouth daily before breakfast. Patient taking differently: Take 50 mg by mouth daily before breakfast.  09/25/16  Yes Plonk, William, MD  clindamycin (CLEOCIN) 150 MG capsule TAKE FOUR TABS PRIOR TO   DENTAL APPOINMENT 03/07/16  Yes [provider]  clonazePAM (KLONOPIN) 0.5 MG tablet Take 10 mg by mouth as needed. 08/14/19  Yes [provider]  diclofenac Sodium (VOLTAREN) 1 % GEL Apply 1 application topically as needed. 05/09/12  Yes [provider]  Docusate Sodium 100 MG/5ML ENEM Take 1 tablet by mouth daily as needed.   Yes [provider]  FLUoxetine (PROZAC) 20 MG capsule Take 20 mg by mouth daily. 08/23/19  Yes [provider]  fluticasone (FLONASE) 50 MCG/ACT nasal spray Place 2 sprays into both nostrils daily as needed for allergies.    Yes [provider]  ipratropium (ATROVENT) 0.03 % nasal spray Place 1 spray into the nose as needed.  06/09/19 06/08/20 Yes [provider]  meloxicam (MOBIC) 15 MG tablet Take 15 mg by mouth daily. 01/05/19  Yes [provider]  potassium chloride (KLOR-CON) 10 MEQ tablet Take 10 mEq by mouth in the morning and at bedtime. 02/04/19 02/04/20 Yes [provider]  Psyllium-Calcium (FIBER PLUS CALCIUM PO) Take 1 tablet by mouth in the morning and at bedtime.   Yes [provider]  pyridOXINE (VITAMIN B-6) 100 MG tablet Take 100 mg by mouth daily.   Yes [provider]  rizatriptan (MAXALT) 10 MG tablet Take 10 mg by mouth as needed for migraine.    Yes [provider]  rosuvastatin (CRESTOR) 5 MG tablet Take 5 mg by mouth daily. 11/19/17  Yes [provider]  SUMAtriptan (IMITREX) 100 MG tablet Take 1 tablet (100 mg total) by mouth daily as needed for migraine. May repeat in 2 hours if headache persists or recurs. 11/23/16  Yes Plonk, Chrissie Noa, MD  traZODone (DESYREL) 50 MG tablet Take 50 mg by mouth at bedtime. 07/01/19 06/30/20 Yes [provider]  Vitamin D, Cholecalciferol, 50 MCG (2000 UT) CAPS Take 1 tablet by mouth daily.   Yes [provider]  diazepam (VALIUM) 5 MG tablet Take 5 mg by mouth daily as needed for anxiety. Patient not taking: Reported on 10/01/2019    [provider]  FLUoxetine (PROZAC) 10 MG capsule  08/14/19   [provider]    Allergies: Allergies  Allergen Reactions  . Gentamicin Rash, Itching and Other (See Comments)  . Hydralazine Nausea And Vomiting  . Hydrocodone Itching  . Ibandronic Acid Other (See Comments)    cracked teeth and joint pain  . Penicillins Hives and Other (See Comments)    Has patient had a PCN reaction causing immediate rash, facial/tongue/throat swelling, SOB or lightheadedness with hypotension: No Has patient had a PCN reaction causing severe rash involving mucus membranes or skin necrosis: Yes Has patient had a PCN reaction that required hospitalization  No Has patient had a PCN reaction occurring within the last 10 years: No If all of the above answers are "NO", then may proceed with Cephalosporin use.     Social History:  reports that she quit smoking about 43 years ago. Her smoking use included cigarettes. She has never used smokeless tobacco. She reports current drug use. Frequency: 1.00 time per week. Drug: Marijuana. She reports that she does not drink alcohol.   Family History: Family History  Problem Relation Age of Onset  . Aneurysm Mother   . Lung cancer Brother     Review of Systems: Review of Systems  Constitutional: Negative for chills and fever.  HENT: Negative for hearing loss.   Respiratory: Negative for shortness of breath.   Cardiovascular: Negative for  chest pain.  Gastrointestinal: Positive for abdominal pain, constipation, nausea and vomiting. Negative for blood in stool and diarrhea.  Genitourinary: Negative for dysuria.  Musculoskeletal: Positive for back pain. Negative for myalgias.  Skin: Negative for rash.  Neurological: Negative for dizziness.  Psychiatric/Behavioral: Negative for depression.    Physical Exam BP 138/81   Pulse (!) 128   Temp 98.6 F (37 C) (Oral)   Ht 5\' 3"  (1.6 m)   Wt 180 lb (81.6 kg)   SpO2 96%   BMI 31.89 kg/m  CONSTITUTIONAL: No acute distress HEENT:  Normocephalic, atraumatic, extraocular motion intact. NECK: Trachea is midline, and there is no jugular venous distension.  RESPIRATORY:  Lungs are clear, and breath sounds are equal bilaterally. Normal respiratory effort without pathologic use of accessory muscles. CARDIOVASCULAR: Heart is regular rhythm although appears having a low-grade tachycardia. GI: The abdomen is soft, nondistended, with mild tenderness to palpation in the epigastric and right upper quadrant areas.  Negative Murphy's sign today.  MUSCULOSKELETAL:  Normal muscle strength and tone in all four extremities.  No peripheral edema or cyanosis. SKIN: Skin  turgor is normal. There are no pathologic skin lesions.  NEUROLOGIC:  Motor and sensation is grossly normal.  Cranial nerves are grossly intact. PSYCH:  Alert and oriented to person, place and time. Affect is normal.  Laboratory Analysis: Labs from 09/24/2019: Sodium 137, potassium 3.4, chloride 97, CO2 25, BUN 19, creatinine 0.97.  Total bilirubin 1.0, AST 30, ALT 34, alkaline phosphatase 37, lipase 31.  WBC 10.3, hemoglobin 14.6, hematocrit 40.4, platelet 232.  Imaging: CT scan abdomen/pelvis 09/24/2019: IMPRESSION: Cholelithiasis without complicatingfactors. Fatty liver. Diverticulosis without diverticulitis. Chronic changes as described above.  Assessment and Plan: This is a 74 y.o. female with symptomatic cholelithiasis as well as chronic constipation.  -Discussed with the patient that for her constipation, she may also try using MiraLAX once daily.  This is a gentle laxative and should not cause any cramping pain.  She should continue with her fiber as well as stool softener as needed. -Also discussed with the patient the findings on her CT scan.  With respect to diverticulosis, there was no evidence of diverticulitis.  Encouraged her to continue high-fiber diet, but otherwise at this point, there is no contraindication to having foods with nuts, popcorn, seeds, etc. -The patient reports that she likely has had multiple minor episodes of this kind of pain that brought her to the emergency room in the past.  Her cousin agrees with this.  I think her presenting symptom most likely was symptomatic cholelithiasis, in addition to her being constipated.  Discussed with her that the 2 are not necessarily related but given her symptoms, I do think that proceeding with surgery for cholecystectomy is a reasonable option.  Discussed with her the role of robotic cholecystectomy with ICG cholangiogram.  Described the risks of bleeding, infection, and injury to surrounding structures, as well as the risk  for open surgery.  Discussed the postoperative recovery and restrictions.  She is willing to proceed.  We will tentatively schedule her for 10/14/2019. -With regards to her ankle surgery, will discuss with Dr. 10/16/2019 if it is possible to do a combined case so she only has to go through 1 anesthesia setting.  We may have to coordinate dates but I am happy to be flexible.  Face-to-face time spent with the patient and care providers was 45 minutes, with more than 50% of the time spent counseling, educating, and coordinating care of the patient.  Melvyn Neth, [facility]

## 2019-10-03 ENCOUNTER — Telehealth: Payer: Self-pay | Admitting: Surgery

## 2019-10-03 NOTE — Telephone Encounter (Signed)
Pt has been advised of Pre-Admission date/time, COVID Testing date and Surgery date.  Surgery Date: 10/14/19 Preadmission Testing Date: 10/07/19 (phone 1p-5p) Covid Testing Date: 10/10/19 - patient [facility] [facility]) between 8a-1p   Patient has been made aware to call [phone], between 1-3:00pm the day before surgery, to find out what time to arrive for surgery.

## 2019-10-06 ENCOUNTER — Telehealth: Payer: Self-pay | Admitting: Surgery

## 2019-10-06 NOTE — Telephone Encounter (Signed)
Updated surgery information.  Pt has been advised of Pre-Admission date/time, COVID Testing date and Surgery date.  Surgery Date: 10/30/19 Preadmission Testing Date: 10/21/19 (phone 8a-1p) Covid Testing Date: 10/28/19 - patient advised to go to [facility] ([address] [facility]) between 8a-1p   Patient has been made aware to call [phone], between 1-3:00pm the day before surgery, to find out what time to arrive for surgery.    Dr. Aleen Campi and Dr. Gwyneth Revels, DPM are using same day anesthesia setting for surgery.  Patient is also aware of this.

## 2019-10-06 NOTE — Progress Notes (Signed)
Medical Clearance received from Lenon Oms FNP. The patient is cleared at low risk for surgery.

## 2019-10-07 ENCOUNTER — Inpatient Hospital Stay: Admission: RE | Admit: 2019-10-07 | Payer: PPO | Source: Ambulatory Visit

## 2019-10-07 DIAGNOSIS — H2513 Age-related nuclear cataract, bilateral: Secondary | ICD-10-CM | POA: Diagnosis not present

## 2019-10-08 ENCOUNTER — Ambulatory Visit
Payer: PPO | Attending: Student in an Organized Health Care Education/Training Program | Admitting: Student in an Organized Health Care Education/Training Program

## 2019-10-08 ENCOUNTER — Other Ambulatory Visit: Payer: Self-pay

## 2019-10-08 ENCOUNTER — Encounter: Payer: Self-pay | Admitting: Student in an Organized Health Care Education/Training Program

## 2019-10-08 VITALS — BP 126/70 | HR 74 | Temp 97.2°F | Resp 16 | Ht 63.0 in | Wt 179.0 lb

## 2019-10-08 DIAGNOSIS — M5416 Radiculopathy, lumbar region: Secondary | ICD-10-CM

## 2019-10-08 DIAGNOSIS — G894 Chronic pain syndrome: Secondary | ICD-10-CM | POA: Diagnosis not present

## 2019-10-08 DIAGNOSIS — G8929 Other chronic pain: Secondary | ICD-10-CM | POA: Diagnosis not present

## 2019-10-08 NOTE — Progress Notes (Signed)
PROVIDER NOTE: Information contained herein reflects review and annotations entered in association with encounter. Interpretation of such information and data should be left to medically-trained personnel. Information provided to patient can be located elsewhere in the medical record under "Patient Instructions". Document created using STT-dictation technology, any transcriptional errors that may result from process are unintentional.    Patient: Carol [name]  Service Category: E/M  Provider: Gillis Santa, MD  DOB: [birth year]  DOS: 10/08/2019  Specialty: Interventional Pain Management  MRN: [id number]  Setting: Ambulatory outpatient  PCP: Sallee Lange, NP  Type: Established Patient    Referring Provider: Sallee Lange, *  Location: Office  Delivery: Face-to-face     HPI  Reason for encounter: [name], a 74 y.o. year old female, is here today for evaluation and management of her Lumbar radiculopathy [M54.16]. Carol [name] primary complain today is Back Pain (lower) Last encounter: Practice (09/02/2019). My last encounter with her was on 09/01/2019. Pertinent problems: Carol [name] has Degenerative spondylolisthesis; Fibromyalgia; OA (osteoarthritis); Status post left partial knee replacement; Osteopenia of spine; History of lumbar fusion; Hx of fusion of cervical spine; Obesity (BMI 30.0-34.9); Leg pain, left; Lumbar radiculopathy; and Chronic pain syndrome on their pertinent problem list. Pain Assessment: Severity of Chronic pain is reported as a 1 /10. Location: Back Lower/denies. Onset: More than a month ago. Quality:  . Timing: Occasional (r/t weather or increased activity). Modifying factor(s): procedure. Vitals:  height is '5\' 3"'$  (1.6 m) and weight is 179 lb (81.2 kg). Her temporal temperature is 97.2 F (36.2 C) (abnormal). Her blood pressure is 126/70 and her pulse is 74. Her respiration is 16 and oxygen saturation is 100%.    Post-Procedure Evaluation  Procedure:  09/01/19  Type: Diagnostic Epidural Steroid Injection #1  Region: Caudal Level: Sacrococcygeal   Laterality: Midline   Sedation: Please see nurses note.  Effectiveness during initial hour after procedure(Ultra-Short Term Relief): 100 %   Local anesthetic used: Long-acting (4-6 hours) Effectiveness: Defined as any analgesic benefit obtained secondary to the administration of local anesthetics. This carries significant diagnostic value as to the etiological location, or anatomical origin, of the pain. Duration of benefit is expected to coincide with the duration of the local anesthetic used.  Effectiveness during initial 4-6 hours after procedure(Short-Term Relief): 100 %   Long-term benefit: Defined as any relief past the pharmacologic duration of the local anesthetics.  Effectiveness past the initial 6 hours after procedure(Long-Term Relief): 90 % (X 10 days then 90% better since; now able to stand and cook!)   Current benefits: Defined as benefit that persist at this time.   Analgesia:  90-100% better Function: Carol [name] reports improvement in function ROM: Carol [name] reports improvement in ROM    ROS  Constitutional: Denies any fever or chills Gastrointestinal: No reported hemesis, hematochezia, vomiting, or acute GI distress Musculoskeletal: Denies any acute onset joint swelling, redness, loss of ROM, or weakness Neurological: No reported episodes of acute onset apraxia, aphasia, dysarthria, agnosia, amnesia, paralysis, loss of coordination, or loss of consciousness  Medication Review  Calcium Carbonate-Vitamin D, Docusate Sodium, FLUoxetine, Psyllium-Calcium, SUMAtriptan, Vitamin D (Cholecalciferol), alendronate, azelastine, cetirizine, chlorthalidone, clindamycin, clonazePAM, diazepam, diclofenac Sodium, fluticasone, ipratropium, meloxicam, potassium chloride, pyridOXINE, rizatriptan, rosuvastatin, and traZODone  History Review  Allergy: Carol [name] is allergic to gentamicin,  hydralazine, hydrocodone, ibandronic acid, and penicillins. Drug: Carol [name]  reports current drug use. Frequency: 1.00 time per week. Drug: Marijuana. Alcohol:  reports no history of alcohol use. Tobacco:  reports that she quit smoking about 43 years ago. Her smoking use included cigarettes. She has never used smokeless tobacco. Social: Carol [name]  reports that she quit smoking about 43 years ago. Her smoking use included cigarettes. She has never used smokeless tobacco. She reports current drug use. Frequency: 1.00 time per week. Drug: Marijuana. She reports that she does not drink alcohol. Medical:  has a past medical history of Anxiety, Arthritis, Depression, Headache, Hypertension, Positive skin test for tuberculosis, Wears contact lenses, Wears dentures, and Wears hearing aid in right ear. Surgical: Carol [name]  has a past surgical history that includes Abdominal hysterectomy; Tonsillectomy; Breast surgery (Right); Tubal ligation; Joint replacement (Right); Myringotomy with tube placement (Right); Hand tendon surgery (Right, 2016); Back surgery; and Lumbar fusion. Family: family history includes Aneurysm in her mother; Lung cancer in her brother.  Laboratory Chemistry Profile   Renal Lab Results  Component Value Date   BUN 19 09/24/2019   CREATININE 0.97 09/24/2019   BCR 18 07/19/2016   GFRAA >60 09/24/2019   GFRNONAA 58 (L) 09/24/2019     Hepatic Lab Results  Component Value Date   AST 30 09/24/2019   ALT 34 09/24/2019   ALBUMIN 4.3 09/24/2019   ALKPHOS 57 09/24/2019   LIPASE 31 09/24/2019     Electrolytes Lab Results  Component Value Date   NA 137 09/24/2019   K 3.4 (L) 09/24/2019   CL 97 (L) 09/24/2019   CALCIUM 9.1 09/24/2019   MG 1.6 (L) 09/06/2012     Bone Lab Results  Component Value Date   VD25OH 56.6 04/26/2017     Inflammation (CRP: Acute Phase) (ESR: Chronic Phase) No results found for: CRP, ESRSEDRATE, LATICACIDVEN     Note: Above Lab results  reviewed.  Recent Imaging Review  CT ABDOMEN PELVIS W CONTRAST Addendum: ADDENDUM REPORT: 09/25/2019 02:39   ADDENDUM:  Impression should also read: Cholelithiasis without complicating  factors.   Electronically Signed    By: Inez Catalina M.D.    On: 09/25/2019 02:39 Narrative: CLINICAL DATA:  Acute abdominal pain  EXAM: CT ABDOMEN AND PELVIS WITH CONTRAST  TECHNIQUE: Multidetector CT imaging of the abdomen and pelvis was performed using the standard protocol following bolus administration of intravenous contrast.  CONTRAST:  112m OMNIPAQUE IOHEXOL 300 MG/ML  SOLN  COMPARISON:  09/24/2019  FINDINGS: Lower chest: No acute abnormality.  Hepatobiliary: Fatty infiltration of the liver is noted. The gallbladder demonstrates multiple gallstones without complicating factors.  Pancreas: Unremarkable. No pancreatic ductal dilatation or surrounding inflammatory changes.  Spleen: Normal in size without focal abnormality.  Adrenals/Urinary Tract: Adrenal glands are within normal limits. The kidneys demonstrate a normal enhancement pattern. Delayed images demonstrate normal excretion of contrast material. The bladder is decompressed.  Stomach/Bowel: Colon shows diffuse diverticular change without definitive diverticulitis. The appendix is not well visualized. No inflammatory changes to suggest appendicitis are seen. Small bowel and stomach appear within normal limits.  Vascular/Lymphatic: Atherosclerotic calcifications of the aorta are noted. No significant lymphadenopathy is seen.  Reproductive: Status post hysterectomy. No adnexal masses.  Other: No abdominal wall hernia or abnormality. No abdominopelvic ascites.  Musculoskeletal: Postsurgical changes are noted in the lower lumbar spine.  IMPRESSION: Fatty liver.  Diverticulosis without diverticulitis.  Chronic changes as described above.  Electronically Signed: By: MInez CatalinaM.D. On: 09/25/2019  02:27 Note: Reviewed        Physical Exam  General appearance: Well nourished, well developed, and well hydrated. In no apparent acute distress Mental status:  Alert, oriented x 3 (person, place, & time)       Respiratory: No evidence of acute respiratory distress Eyes: PERLA Vitals: BP 126/70   Pulse 74   Temp (!) 97.2 F (36.2 C) (Temporal)   Resp 16   Ht '5\' 3"'$  (1.6 m)   Wt 179 lb (81.2 kg)   SpO2 100%   BMI 31.71 kg/m  BMI: Estimated body mass index is 31.71 kg/m as calculated from the following:   Height as of this encounter: '5\' 3"'$  (1.6 m).   Weight as of this encounter: 179 lb (81.2 kg). Ideal: Ideal body weight: 52.4 kg (115 lb 8.3 oz) Adjusted ideal body weight: 63.9 kg (140 lb 14.6 oz)  Lumbar Spine Area Exam  Skin & Axial Inspection: Well healed scar from previous spine surgery detected Alignment: Symmetrical Functional ROM: Improved after treatment       Stability: No instability detected Muscle Tone/Strength: Functionally intact. No obvious neuro-muscular anomalies detected. Sensory (Neurological): Improved  *(Flexion, ABduction and External Rotation) Gait & Posture Assessment  Ambulation: Limited Gait: Limited. Using assistive device to ambulate Posture: Difficulty standing up straight, due to pain    Lower Extremity Exam    Side: Right lower extremity  Side: Left lower extremity  Stability: No instability observed          Stability: No instability observed          Skin & Extremity Inspection: Skin color, temperature, and hair growth are WNL. No peripheral edema or cyanosis. No masses, redness, swelling, asymmetry, or associated skin lesions. No contractures.  Skin & Extremity Inspection: Skin color, temperature, and hair growth are WNL. No peripheral edema or cyanosis. No masses, redness, swelling, asymmetry, or associated skin lesions. No contractures.  Functional ROM: Pain restricted ROM for all joints of the lower extremity          Functional ROM: Pain  restricted ROM for all joints of the lower extremity          Muscle Tone/Strength: Functionally intact. No obvious neuro-muscular anomalies detected.  Muscle Tone/Strength: Functionally intact. No obvious neuro-muscular anomalies detected.  Sensory (Neurological): Neurogenic pain pattern        Sensory (Neurological): Neurogenic pain pattern        DTR: Patellar: deferred today Achilles: deferred today Plantar: deferred today  DTR: Patellar: deferred today Achilles: deferred today Plantar: deferred today  Palpation: No palpable anomalies  Palpation: No palpable anomalies    Assessment   Status Diagnosis  Improved Improved Controlled 1. Lumbar radiculopathy   2. Chronic radicular lumbar pain   3. Chronic pain syndrome      Updated Problems: Problem  Lumbar Radiculopathy  Chronic Pain Syndrome  Leg Pain, Left  Obesity (Bmi 30.0-34.9)  Osteopenia of Spine  History of Lumbar Fusion  Hx of Fusion of Cervical Spine  Degenerative Spondylolisthesis  Status Post Left Partial Knee Replacement  Fibromyalgia  Oa (Osteoarthritis)    Plan of Care  Carol [name] has a current medication list which includes the following long-term medication(s): calcium carbonate-vitamin d, chlorthalidone, fluoxetine, fluticasone, ipratropium, potassium chloride, sumatriptan, trazodone, and fluoxetine.   Successful caudal epidural steroid injection.  Patient endorses approximately 90% pain relief in regards to her low back and bilateral lower extremity pain.  She also endorses improvement in functional status and less pain while performing ADLs.  She is very pleased with the results.  Follow-up as needed, as needed order placed for repeat LESI if she has return of symptoms.  Patient endorsed understanding.  Orders:  Orders Placed This Encounter  Procedures  . Lumbar Epidural Injection    Standing Status:   Standing    Number of Occurrences:   9    Standing Expiration Date:   04/09/2020     Scheduling Instructions:     Purpose: Palliative     Indication: Lower extremity pain/Sciatica unspecified side (M54.30).     Side: Midline     Level: TBD     Sedation: Patient's choice.     TIMEFRAME: PRN procedure. (Carol [name] will call when needed.)    Order Specific Question:   Where will this procedure be performed?    Answer:   [facility]   Follow-up plan:   Return if symptoms worsen or fail to improve.     Status post caudal ESI on 09/01/2019: Helped significantly, repeat as needed.  Interlaminar window seem appropriate for spinal cord stimulator trial    Recent Visits Date Type Provider Dept  09/01/19 Procedure visit Gillis Santa, MD [facility]  08/25/19 Office Visit Gillis Santa, MD [facility]  Showing recent visits within past 90 days and meeting all other requirements Today's Visits Date Type Provider Dept  10/08/19 Office Visit Gillis Santa, MD [facility]  Showing today's visits and meeting all other requirements Future Appointments No visits were found meeting these conditions. Showing future appointments within next 90 days and meeting all other requirements  I discussed the assessment and treatment plan with the patient. The patient was provided an opportunity to ask questions and all were answered. The patient agreed with the plan and demonstrated an understanding of the instructions.  Patient advised to call back or seek an in-person evaluation if the symptoms or condition worsens.  Duration of encounter:15 minutes.  Note by: Gillis Santa, MD Date: 10/08/2019; Time: 2:43 PM

## 2019-10-08 NOTE — Progress Notes (Signed)
Safety precautions to be maintained throughout the outpatient stay will include: orient to surroundings, keep bed in low position, maintain call bell within reach at all times, provide assistance with transfer out of bed and ambulation.  

## 2019-10-10 ENCOUNTER — Other Ambulatory Visit: Payer: PPO

## 2019-10-14 ENCOUNTER — Ambulatory Visit: Admit: 2019-10-14 | Payer: PPO | Admitting: Surgery

## 2019-10-14 SURGERY — CHOLECYSTECTOMY, ROBOT-ASSISTED, LAPAROSCOPIC
Anesthesia: General

## 2019-10-16 ENCOUNTER — Other Ambulatory Visit: Payer: Self-pay | Admitting: Podiatry

## 2019-10-16 DIAGNOSIS — S93492D Sprain of other ligament of left ankle, subsequent encounter: Secondary | ICD-10-CM | POA: Diagnosis not present

## 2019-10-16 DIAGNOSIS — S93432D Sprain of tibiofibular ligament of left ankle, subsequent encounter: Secondary | ICD-10-CM | POA: Diagnosis not present

## 2019-10-16 DIAGNOSIS — M25372 Other instability, left ankle: Secondary | ICD-10-CM | POA: Diagnosis not present

## 2019-10-21 ENCOUNTER — Other Ambulatory Visit: Payer: Self-pay | Admitting: Surgery

## 2019-10-21 ENCOUNTER — Other Ambulatory Visit: Payer: Self-pay

## 2019-10-21 ENCOUNTER — Encounter
Admission: RE | Admit: 2019-10-21 | Discharge: 2019-10-21 | Disposition: A | Discharge status: Home / Self Care | Payer: PPO | Source: Ambulatory Visit | Attending: Surgery | Admitting: Surgery

## 2019-10-21 DIAGNOSIS — K8020 Calculus of gallbladder without cholecystitis without obstruction: Secondary | ICD-10-CM

## 2019-10-21 NOTE — Patient Instructions (Signed)
Your procedure is scheduled on: 10/30/19 Report to [place]. To find out your arrival time please call [phone] between 1PM - 3PM on 10/29/19.  Remember: Instructions that are not followed completely may result in serious medical risk, up to and including death, or upon the discretion of your surgeon and anesthesiologist your surgery may need to be rescheduled.     _X__ 1. Do not eat food after midnight the night before your procedure.                 No gum chewing or hard candies. You may drink clear liquids up to 2 hours                 before you are scheduled to arrive for your surgery- DO not drink clear                 liquids within 2 hours of the start of your surgery.                 Clear Liquids include:  water, apple juice without pulp, clear carbohydrate                 drink such as Clearfast or Gatorade, Black Coffee or Tea (Do not add                 anything to coffee or tea). Diabetics water only  __X__2.  On the morning of surgery brush your teeth with toothpaste and water, you                 may rinse your mouth with mouthwash if you wish.  Do not swallow any              toothpaste of mouthwash.     _X__ 3.  No Alcohol for 24 hours before or after surgery.   _X__ 4.  Do Not Smoke or use e-cigarettes For 24 Hours Prior to Your Surgery.                 Do not use any chewable tobacco products for at least 6 hours prior to                 surgery.  ____  5.  Bring all medications with you on the day of surgery if instructed.   __X__  6.  Notify your doctor if there is any change in your medical condition      (cold, fever, infections).     Do not wear jewelry, make-up, hairpins, clips or nail polish. Do not wear lotions, powders, or perfumes.  Do not shave 48 hours prior to surgery. Men may shave face and neck. Do not bring valuables to the hospital.    [facility] is not responsible for any belongings or  valuables.  Contacts, dentures/partials or body piercings may not be worn into surgery. Bring a case for your contacts, glasses or hearing aids, a denture cup will be supplied. Leave your suitcase in the car. After surgery it may be brought to your room. For patients admitted to the hospital, discharge time is determined by your treatment team.   Patients discharged the day of surgery will not be allowed to drive home.   Please read over the following fact sheets that you were given:   MRSA Information  __X__ Take these medicines the morning of surgery with A SIP OF WATER:  1. cetirizine (ZYRTEC) 10 MG tablet  2. FLUoxetine (PROZAC) 20 MG capsule  3. rosuvastatin (CRESTOR) 5 MG tablet  4.  5.  6.  ____ Fleet Enema (as directed)   __X__ Use CHG Soap/SAGE wipes as directed  ____ Use inhalers on the day of surgery  ____ Stop metformin/Janumet/Farxiga 2 days prior to surgery    ____ Take 1/2 of usual insulin dose the night before surgery. No insulin the morning          of surgery.   ____ Stop Blood Thinners Coumadin/Plavix/Xarelto/Pleta/Pradaxa/Eliquis/Effient/Aspirin  on   Or contact your Surgeon, Cardiologist or Medical Doctor regarding  ability to stop your blood thinners  __X__ Stop Anti-inflammatories 7 days before surgery such as Advil, Ibuprofen, Motrin,  BC or Goodies Powder, Naprosyn, Naproxen, Aleve, Aspirin   Meloxicam  __X__ Stop all herbal supplements, fish oil or vitamin E until after surgery.    ____ Bring C-Pap to the hospital.     How to Use an Incentive Spirometer An incentive spirometer is a tool that measures how well you are filling your lungs with each breath. Learning to take long, deep breaths using this tool can help you keep your lungs clear and active. This may help to reverse or lessen your chance of developing breathing (pulmonary) problems, especially infection. You may be asked to use a spirometer:  After a surgery.  If you have a lung  problem or a history of smoking.  After a long period of time when you have been unable to move or be active. If the spirometer includes an indicator to show the highest number that you have reached, your health care provider or respiratory therapist will help you set a goal. Keep a list (log) of your progress as told by your health care provider. What are the risks?  Breathing too quickly may cause dizziness or cause you to pass out. Take your time so you do not get dizzy or light-headed.  If you are in pain, you may need to take pain medicine before doing incentive spirometry. It is harder to take a deep breath if you are having pain. How to use your incentive spirometer  1. Sit up on the edge of your bed or on a chair. 2. Hold the incentive spirometer so that it is in an upright position. 3. Before you use the spirometer, breathe out normally. 4. Place the mouthpiece in your mouth. Make sure your lips are closed tightly around it. 5. Breathe in slowly and as deeply as you can through your mouth, causing the piston or the ball to rise toward the top of the chamber. 6. Hold your breath for 3-5 seconds, or for as long as possible. ? If the spirometer includes a coach indicator, use this to guide you in breathing. Slow down your breathing if the indicator goes above the marked areas. 7. Remove the mouthpiece from your mouth and breathe out normally. The piston or ball will return to the bottom of the chamber. 8. Rest for a few seconds, then repeat the steps 10 or more times. ? Take your time and take a few normal breaths between deep breaths so that you do not get dizzy or light-headed. ? Do this every 1-2 hours when you are awake. 9. If the spirometer includes a goal marker to show the highest number you have reached (best effort), use this as a goal to work toward during each repetition. 10. After each set of 10 deep breaths, cough a  few times. This will help to make sure that your lungs are  clear. ? If you have an incision on your chest or abdomen from surgery, place a pillow or a rolled-up towel firmly against the incision when you cough. This can help to reduce pain from coughing. General tips  When you become able to get out of bed, walk around often and continue to cough to help clear your lungs.  Keep using the incentive spirometer until your health care provider says it is okay to stop using it. If you have been in the hospital, you may be told to keep using the spirometer at home. Contact a health care provider if:  You are having difficulty using the spirometer.  You have trouble using the spirometer as often as instructed.  Your pain medicine is not giving enough relief for you to use the spirometer as told.  You have a fever.  You develop shortness of breath. Get help right away if:  You develop a cough with bloody mucus from the lungs (bloody sputum).  You have fluid or blood coming from an incision site after you cough. Summary  An incentive spirometer is a tool that can help you learn to take long, deep breaths to keep your lungs clear and active.  You may be asked to use a spirometer after a surgery, if you have a lung problem or a history of smoking, or if you have been inactive for a long period of time.  Use your incentive spirometer as instructed every 1-2 hours while you are awake.  If you have an incision on your chest or abdomen, place a pillow or a rolled-up towel firmly against your incision when you cough. This will help to reduce pain. This information is not intended to replace advice given to you by your health care provider. Make sure you discuss any questions you have with your health care provider. Document Revised: 10/04/2018 Document Reviewed: 01/17/2017 Elsevier Patient Education  2020 [employer].

## 2019-10-23 ENCOUNTER — Other Ambulatory Visit
Admission: RE | Admit: 2019-10-23 | Discharge: 2019-10-23 | Disposition: A | Discharge status: Home / Self Care | Payer: PPO | Source: Ambulatory Visit | Attending: Surgery | Admitting: Surgery

## 2019-10-23 ENCOUNTER — Other Ambulatory Visit: Payer: Self-pay

## 2019-10-23 DIAGNOSIS — I10 Essential (primary) hypertension: Secondary | ICD-10-CM | POA: Diagnosis not present

## 2019-10-23 DIAGNOSIS — Z01810 Encounter for preprocedural cardiovascular examination: Secondary | ICD-10-CM | POA: Diagnosis not present

## 2019-10-23 DIAGNOSIS — Z01818 Encounter for other preprocedural examination: Secondary | ICD-10-CM | POA: Diagnosis not present

## 2019-10-23 LAB — POTASSIUM: Potassium: 3.5 mmol/L (ref 3.5–5.1)

## 2019-10-28 ENCOUNTER — Other Ambulatory Visit: Payer: Self-pay

## 2019-10-28 ENCOUNTER — Other Ambulatory Visit
Admission: RE | Admit: 2019-10-28 | Discharge: 2019-10-28 | Disposition: A | Discharge status: Home / Self Care | Payer: PPO | Source: Ambulatory Visit | Attending: Surgery | Admitting: Surgery

## 2019-10-28 DIAGNOSIS — Z01812 Encounter for preprocedural laboratory examination: Secondary | ICD-10-CM | POA: Insufficient documentation

## 2019-10-28 DIAGNOSIS — Z20822 Contact with and (suspected) exposure to covid-19: Secondary | ICD-10-CM | POA: Insufficient documentation

## 2019-10-28 LAB — SARS CORONAVIRUS 2 (TAT 6-24 HRS): SARS Coronavirus 2: NEGATIVE

## 2019-10-29 MED ORDER — INDOCYANINE GREEN 25 MG IV SOLR
2.5000 mg | INTRAVENOUS | Status: AC
  Administered 2019-10-30: 2.5 mg via INTRAVENOUS
  Filled 2019-10-29: qty 1

## 2019-10-29 MED ORDER — CLINDAMYCIN PHOSPHATE 900 MG/50ML IV SOLN
900.0000 mg | INTRAVENOUS | Status: AC
  Administered 2019-10-30: 900 mg via INTRAVENOUS

## 2019-10-30 ENCOUNTER — Other Ambulatory Visit: Payer: Self-pay

## 2019-10-30 ENCOUNTER — Inpatient Hospital Stay: Payer: PPO | Admitting: Urgent Care

## 2019-10-30 ENCOUNTER — Encounter: Payer: Self-pay | Admitting: Surgery

## 2019-10-30 ENCOUNTER — Encounter
Admission: RE | Disposition: A | Discharge status: Home / Self Care | Payer: Self-pay | Source: Home / Self Care | Attending: Surgery

## 2019-10-30 ENCOUNTER — Ambulatory Visit
Admission: RE | Admit: 2019-10-30 | Discharge: 2019-10-30 | Disposition: A | Discharge status: Home / Self Care | Payer: PPO | Attending: Surgery | Admitting: Surgery

## 2019-10-30 DIAGNOSIS — E785 Hyperlipidemia, unspecified: Secondary | ICD-10-CM | POA: Diagnosis not present

## 2019-10-30 DIAGNOSIS — F419 Anxiety disorder, unspecified: Secondary | ICD-10-CM | POA: Insufficient documentation

## 2019-10-30 DIAGNOSIS — Z981 Arthrodesis status: Secondary | ICD-10-CM | POA: Insufficient documentation

## 2019-10-30 DIAGNOSIS — K8020 Calculus of gallbladder without cholecystitis without obstruction: Secondary | ICD-10-CM | POA: Diagnosis not present

## 2019-10-30 DIAGNOSIS — Z791 Long term (current) use of non-steroidal anti-inflammatories (NSAID): Secondary | ICD-10-CM | POA: Insufficient documentation

## 2019-10-30 DIAGNOSIS — Z7983 Long term (current) use of bisphosphonates: Secondary | ICD-10-CM | POA: Insufficient documentation

## 2019-10-30 DIAGNOSIS — I10 Essential (primary) hypertension: Secondary | ICD-10-CM | POA: Diagnosis not present

## 2019-10-30 DIAGNOSIS — M25372 Other instability, left ankle: Secondary | ICD-10-CM | POA: Insufficient documentation

## 2019-10-30 DIAGNOSIS — Z79899 Other long term (current) drug therapy: Secondary | ICD-10-CM | POA: Insufficient documentation

## 2019-10-30 DIAGNOSIS — Z888 Allergy status to other drugs, medicaments and biological substances status: Secondary | ICD-10-CM | POA: Insufficient documentation

## 2019-10-30 DIAGNOSIS — S93431A Sprain of tibiofibular ligament of right ankle, initial encounter: Secondary | ICD-10-CM | POA: Diagnosis not present

## 2019-10-30 DIAGNOSIS — F329 Major depressive disorder, single episode, unspecified: Secondary | ICD-10-CM | POA: Diagnosis not present

## 2019-10-30 DIAGNOSIS — M1990 Unspecified osteoarthritis, unspecified site: Secondary | ICD-10-CM | POA: Diagnosis not present

## 2019-10-30 DIAGNOSIS — K760 Fatty (change of) liver, not elsewhere classified: Secondary | ICD-10-CM | POA: Diagnosis not present

## 2019-10-30 DIAGNOSIS — S93492A Sprain of other ligament of left ankle, initial encounter: Secondary | ICD-10-CM | POA: Diagnosis not present

## 2019-10-30 DIAGNOSIS — Z90710 Acquired absence of both cervix and uterus: Secondary | ICD-10-CM | POA: Diagnosis not present

## 2019-10-30 DIAGNOSIS — Z87891 Personal history of nicotine dependence: Secondary | ICD-10-CM | POA: Diagnosis not present

## 2019-10-30 DIAGNOSIS — F418 Other specified anxiety disorders: Secondary | ICD-10-CM | POA: Diagnosis not present

## 2019-10-30 DIAGNOSIS — Z8669 Personal history of other diseases of the nervous system and sense organs: Secondary | ICD-10-CM | POA: Insufficient documentation

## 2019-10-30 DIAGNOSIS — K8010 Calculus of gallbladder with chronic cholecystitis without obstruction: Secondary | ICD-10-CM | POA: Diagnosis not present

## 2019-10-30 DIAGNOSIS — Z880 Allergy status to penicillin: Secondary | ICD-10-CM | POA: Diagnosis not present

## 2019-10-30 DIAGNOSIS — M25872 Other specified joint disorders, left ankle and foot: Secondary | ICD-10-CM | POA: Diagnosis not present

## 2019-10-30 HISTORY — PX: ANKLE RECONSTRUCTION: SHX1151

## 2019-10-30 HISTORY — PX: ANKLE ARTHROSCOPY: SHX545

## 2019-10-30 LAB — URINE DRUG SCREEN, QUALITATIVE (ARMC ONLY)
Amphetamines, Ur Screen: NOT DETECTED
Barbiturates, Ur Screen: NOT DETECTED
Benzodiazepine, Ur Scrn: NOT DETECTED
Cannabinoid 50 Ng, Ur ~~LOC~~: POSITIVE — AB
Cocaine Metabolite,Ur ~~LOC~~: NOT DETECTED
MDMA (Ecstasy)Ur Screen: NOT DETECTED
Methadone Scn, Ur: NOT DETECTED
Opiate, Ur Screen: NOT DETECTED
Phencyclidine (PCP) Ur S: NOT DETECTED
Tricyclic, Ur Screen: NOT DETECTED

## 2019-10-30 SURGERY — RECONSTRUCTION, ANKLE
Anesthesia: Choice | Site: Ankle | Laterality: Left

## 2019-10-30 SURGERY — CHOLECYSTECTOMY, ROBOT-ASSISTED, LAPAROSCOPIC
Anesthesia: General | Site: Ankle

## 2019-10-30 MED ORDER — PROPOFOL 500 MG/50ML IV EMUL
INTRAVENOUS | Status: AC
  Filled 2019-10-30: qty 50

## 2019-10-30 MED ORDER — CHLORHEXIDINE GLUCONATE 0.12 % MT SOLN
OROMUCOSAL | Status: AC
  Administered 2019-10-30: 15 mL via OROMUCOSAL
  Filled 2019-10-30: qty 15

## 2019-10-30 MED ORDER — CHLORHEXIDINE GLUCONATE 0.12 % MT SOLN: 15.0000 mL | Freq: Once | OROMUCOSAL | Status: AC

## 2019-10-30 MED ORDER — BUPIVACAINE LIPOSOME 1.3 % IJ SUSP: 20.0000 mL | Freq: Once | INTRAMUSCULAR | Status: DC

## 2019-10-30 MED ORDER — SUGAMMADEX SODIUM 500 MG/5ML IV SOLN
INTRAVENOUS | Status: DC | PRN
  Administered 2019-10-30: 100 mg via INTRAVENOUS
  Administered 2019-10-30 (×2): 200 mg via INTRAVENOUS

## 2019-10-30 MED ORDER — GABAPENTIN 300 MG PO CAPS
ORAL_CAPSULE | ORAL | Status: AC
  Administered 2019-10-30: 300 mg via ORAL
  Filled 2019-10-30: qty 1

## 2019-10-30 MED ORDER — CLINDAMYCIN PHOSPHATE 900 MG/50ML IV SOLN
INTRAVENOUS | Status: AC
  Filled 2019-10-30: qty 50

## 2019-10-30 MED ORDER — ACETAMINOPHEN 325 MG PO TABS: 325.0000 mg | ORAL_TABLET | ORAL | Status: DC | PRN

## 2019-10-30 MED ORDER — FENTANYL CITRATE (PF) 250 MCG/5ML IJ SOLN
INTRAMUSCULAR | Status: AC
  Filled 2019-10-30: qty 5

## 2019-10-30 MED ORDER — PROMETHAZINE HCL 25 MG/ML IJ SOLN: 6.2500 mg | INTRAMUSCULAR | Status: DC | PRN

## 2019-10-30 MED ORDER — PROPOFOL 10 MG/ML IV BOLUS
INTRAVENOUS | Status: DC | PRN
  Administered 2019-10-30: 100 mg via INTRAVENOUS

## 2019-10-30 MED ORDER — LIDOCAINE HCL (PF) 1 % IJ SOLN
INTRAMUSCULAR | Status: AC
  Filled 2019-10-30: qty 30

## 2019-10-30 MED ORDER — FENTANYL CITRATE (PF) 100 MCG/2ML IJ SOLN
INTRAMUSCULAR | Status: DC | PRN
  Administered 2019-10-30: 25 ug via INTRAVENOUS
  Administered 2019-10-30: 150 ug via INTRAVENOUS
  Administered 2019-10-30: 50 ug via INTRAVENOUS
  Administered 2019-10-30: 25 ug via INTRAVENOUS

## 2019-10-30 MED ORDER — BUPIVACAINE-EPINEPHRINE (PF) 0.25% -1:200000 IJ SOLN
INTRAMUSCULAR | Status: AC
  Filled 2019-10-30: qty 10

## 2019-10-30 MED ORDER — ACETAMINOPHEN 160 MG/5ML PO SOLN
325.0000 mg | ORAL | Status: DC | PRN
  Filled 2019-10-30: qty 20.3

## 2019-10-30 MED ORDER — CHLORHEXIDINE GLUCONATE CLOTH 2 % EX PADS
6.0000 | MEDICATED_PAD | Freq: Once | CUTANEOUS | Status: AC
  Administered 2019-10-30: 6 via TOPICAL

## 2019-10-30 MED ORDER — ORAL CARE MOUTH RINSE: 15.0000 mL | Freq: Once | OROMUCOSAL | Status: AC

## 2019-10-30 MED ORDER — MEPERIDINE HCL 50 MG/ML IJ SOLN: 6.2500 mg | INTRAMUSCULAR | Status: DC | PRN

## 2019-10-30 MED ORDER — IBUPROFEN 600 MG PO TABS
600.0000 mg | ORAL_TABLET | Freq: Three times a day (TID) | ORAL | 1 refills | Status: DC | PRN
Start: 2019-10-30 — End: 2019-11-12

## 2019-10-30 MED ORDER — SODIUM CHLORIDE 0.9 % IV SOLN
INTRAVENOUS | Status: DC | PRN
  Administered 2019-10-30: 25 ug/min via INTRAVENOUS

## 2019-10-30 MED ORDER — ROCURONIUM BROMIDE 100 MG/10ML IV SOLN
INTRAVENOUS | Status: DC | PRN
  Administered 2019-10-30: 20 mg via INTRAVENOUS
  Administered 2019-10-30: 60 mg via INTRAVENOUS
  Administered 2019-10-30: 10 mg via INTRAVENOUS

## 2019-10-30 MED ORDER — LIDOCAINE-EPINEPHRINE (PF) 1 %-1:200000 IJ SOLN
INTRAMUSCULAR | Status: AC
  Filled 2019-10-30: qty 30

## 2019-10-30 MED ORDER — BUPIVACAINE-EPINEPHRINE (PF) 0.25% -1:200000 IJ SOLN
INTRAMUSCULAR | Status: DC | PRN
  Administered 2019-10-30: 30 mL

## 2019-10-30 MED ORDER — OXYCODONE-ACETAMINOPHEN 5-325 MG PO TABS: 1.0000 | ORAL_TABLET | ORAL | 0 refills | Status: DC | PRN

## 2019-10-30 MED ORDER — DROPERIDOL 2.5 MG/ML IJ SOLN
0.6250 mg | Freq: Once | INTRAMUSCULAR | Status: DC | PRN
  Filled 2019-10-30: qty 2

## 2019-10-30 MED ORDER — LACTATED RINGERS IV SOLN
INTRAVENOUS | Status: DC
  Administered 2019-10-30: 10 mL/h via INTRAVENOUS

## 2019-10-30 MED ORDER — GABAPENTIN 300 MG PO CAPS: 300.0000 mg | ORAL_CAPSULE | ORAL | Status: AC

## 2019-10-30 MED ORDER — ACETAMINOPHEN 500 MG PO TABS: 1000.0000 mg | ORAL_TABLET | ORAL | Status: AC

## 2019-10-30 MED ORDER — BUPIVACAINE-EPINEPHRINE (PF) 0.25% -1:200000 IJ SOLN
INTRAMUSCULAR | Status: AC
  Filled 2019-10-30: qty 30

## 2019-10-30 MED ORDER — FENTANYL CITRATE (PF) 100 MCG/2ML IJ SOLN: 25.0000 ug | INTRAMUSCULAR | Status: DC | PRN

## 2019-10-30 MED ORDER — BUPIVACAINE-EPINEPHRINE (PF) 0.25% -1:200000 IJ SOLN
INTRAMUSCULAR | Status: DC | PRN
  Administered 2019-10-30: 10 mL

## 2019-10-30 MED ORDER — DEXAMETHASONE SODIUM PHOSPHATE 10 MG/ML IJ SOLN
INTRAMUSCULAR | Status: DC | PRN
  Administered 2019-10-30: 10 mg via INTRAVENOUS

## 2019-10-30 MED ORDER — FAMOTIDINE 20 MG PO TABS: 20.0000 mg | ORAL_TABLET | Freq: Once | ORAL | Status: DC

## 2019-10-30 MED ORDER — LIDOCAINE HCL 1 % IJ SOLN
INTRAMUSCULAR | Status: DC | PRN
  Administered 2019-10-30: 10 mL

## 2019-10-30 MED ORDER — POVIDONE-IODINE 7.5 % EX SOLN
Freq: Once | CUTANEOUS | Status: DC
  Filled 2019-10-30: qty 118

## 2019-10-30 MED ORDER — ACETAMINOPHEN 500 MG PO TABS
ORAL_TABLET | ORAL | Status: AC
  Administered 2019-10-30: 1000 mg via ORAL
  Filled 2019-10-30: qty 2

## 2019-10-30 SURGICAL SUPPLY — 109 items
ADAPTER IRRIG TUBE 2 SPIKE SOL (ADAPTER) ×6 IMPLANT
ADH SKN CLS APL DERMABOND .7 (GAUZE/BANDAGES/DRESSINGS) ×2
ADPR TBG 2 SPK PMP STRL ASCP (ADAPTER) ×4
APL PRP STRL LF DISP 70% ISPRP (MISCELLANEOUS) ×2
ARTHROWAND PARAGON T2 (SURGICAL WAND)
BAG INFUSER PRESSURE 100CC (MISCELLANEOUS) IMPLANT
BAG SPEC RTRVL LRG 6X4 10 (ENDOMECHANICALS) ×2
BLADE FULL RADIUS 2.9 (BLADE) IMPLANT
BLADE SHAVER 2.9D 7 MINI (BLADE) ×1 IMPLANT
BNDG CMPR STD VLCR NS LF 5.8X4 (GAUZE/BANDAGES/DRESSINGS) ×4
BNDG COHESIVE 4X5 TAN STRL (GAUZE/BANDAGES/DRESSINGS) ×3 IMPLANT
BNDG CONFORM 2 STRL LF (GAUZE/BANDAGES/DRESSINGS) ×3 IMPLANT
BNDG CONFORM 3 STRL LF (GAUZE/BANDAGES/DRESSINGS) ×3 IMPLANT
BNDG ELASTIC 4X5.8 VLCR NS LF (GAUZE/BANDAGES/DRESSINGS) ×6 IMPLANT
BNDG ESMARK 4X12 TAN STRL LF (GAUZE/BANDAGES/DRESSINGS) ×3 IMPLANT
BNDG GAUZE 4.5X4.1 6PLY STRL (MISCELLANEOUS) ×3 IMPLANT
BOOT STEPPER DURA MED (SOFTGOODS) ×1 IMPLANT
BUR AGGRESSIVE+ 2.5 (BURR) IMPLANT
CANISTER SUCT 1200ML W/VALVE (MISCELLANEOUS) ×3 IMPLANT
CANNULA REDUC XI 12-8 STAPL (CANNULA) ×3
CANNULA REDUCER 12-8 DVNC XI (CANNULA) ×2 IMPLANT
CHLORAPREP W/TINT 26 (MISCELLANEOUS) ×3 IMPLANT
CLIP VESOLOCK MED LG 6/CT (CLIP) ×3 IMPLANT
COVER WAND RF STERILE (DRAPES) ×6 IMPLANT
CUFF TOURN SGL QUICK 18X4 (TOURNIQUET CUFF) ×1 IMPLANT
CUFF TOURN SGL QUICK 24 (TOURNIQUET CUFF)
CUFF TRNQT CYL 24X4X16.5-23 (TOURNIQUET CUFF) IMPLANT
CUP MEDICINE 2OZ PLAST GRAD ST (MISCELLANEOUS) ×3 IMPLANT
DECANTER SPIKE VIAL GLASS SM (MISCELLANEOUS) ×3 IMPLANT
DEFOGGER SCOPE WARMER CLEARIFY (MISCELLANEOUS) ×3 IMPLANT
DERMABOND ADVANCED (GAUZE/BANDAGES/DRESSINGS) ×1
DERMABOND ADVANCED .7 DNX12 (GAUZE/BANDAGES/DRESSINGS) ×2 IMPLANT
DRAPE ARM DVNC X/XI (DISPOSABLE) ×8 IMPLANT
DRAPE COLUMN DVNC XI (DISPOSABLE) ×2 IMPLANT
DRAPE DA VINCI XI ARM (DISPOSABLE) ×12
DRAPE DA VINCI XI COLUMN (DISPOSABLE) ×3
DURAPREP 26ML APPLICATOR (WOUND CARE) ×3 IMPLANT
ELECT CAUTERY BLADE 6.4 (BLADE) ×3 IMPLANT
ELECT REM PT RETURN 9FT ADLT (ELECTROSURGICAL) ×3
ELECTRODE REM PT RTRN 9FT ADLT (ELECTROSURGICAL) ×2 IMPLANT
ETHIBOND 2 0 GREEN CT 2 30IN (SUTURE) ×3 IMPLANT
GLOVE BIO SURGEON STRL SZ7.5 (GLOVE) ×3 IMPLANT
GLOVE INDICATOR 8.0 STRL GRN (GLOVE) ×3 IMPLANT
GLOVE SURG SYN 7.0 (GLOVE) ×6 IMPLANT
GLOVE SURG SYN 7.0 PF PI (GLOVE) ×4 IMPLANT
GLOVE SURG SYN 7.5  E (GLOVE) ×6
GLOVE SURG SYN 7.5 E (GLOVE) ×4 IMPLANT
GLOVE SURG SYN 7.5 PF PI (GLOVE) ×4 IMPLANT
GOWN STRL REUS W/ TWL LRG LVL3 (GOWN DISPOSABLE) ×8 IMPLANT
GOWN STRL REUS W/ TWL XL LVL3 (GOWN DISPOSABLE) ×4 IMPLANT
GOWN STRL REUS W/TWL LRG LVL3 (GOWN DISPOSABLE) ×12
GOWN STRL REUS W/TWL XL LVL3 (GOWN DISPOSABLE) ×6
IRRIGATOR SUCT 8 DISP DVNC XI (IRRIGATION / IRRIGATOR) IMPLANT
IRRIGATOR SUCTION 8MM XI DISP (IRRIGATION / IRRIGATOR)
IV LACTATED RINGER IRRG 3000ML (IV SOLUTION) ×15
IV LR IRRIG 3000ML ARTHROMATIC (IV SOLUTION) ×10 IMPLANT
IV NS 1000ML (IV SOLUTION)
IV NS 1000ML BAXH (IV SOLUTION) IMPLANT
KIT PINK PAD W/HEAD ARE REST (MISCELLANEOUS) ×3
KIT PINK PAD W/HEAD ARM REST (MISCELLANEOUS) ×2 IMPLANT
KIT TURNOVER KIT A (KITS) ×3 IMPLANT
LABEL OR SOLS (LABEL) ×6 IMPLANT
MANIFOLD NEPTUNE II (INSTRUMENTS) ×3 IMPLANT
NDL HYPO 25X1 1.5 SAFETY (NEEDLE) ×2 IMPLANT
NDL SAFETY ECLIPSE 18X1.5 (NEEDLE) ×2 IMPLANT
NEEDLE HYPO 18GX1.5 SHARP (NEEDLE) ×3
NEEDLE HYPO 22GX1.5 SAFETY (NEEDLE) ×3 IMPLANT
NEEDLE HYPO 25X1 1.5 SAFETY (NEEDLE) ×3 IMPLANT
NS IRRIG 500ML POUR BTL (IV SOLUTION) ×3 IMPLANT
OBTURATOR OPTICAL STANDARD 8MM (TROCAR) ×3
OBTURATOR OPTICAL STND 8 DVNC (TROCAR) ×2
OBTURATOR OPTICALSTD 8 DVNC (TROCAR) ×2 IMPLANT
PACK KNEE ARTHRO (MISCELLANEOUS) ×3 IMPLANT
PACK LAP CHOLECYSTECTOMY (MISCELLANEOUS) ×3 IMPLANT
PENCIL ELECTRO HAND CTR (MISCELLANEOUS) ×6 IMPLANT
POUCH SPECIMEN RETRIEVAL 10MM (ENDOMECHANICALS) ×3 IMPLANT
RESECTOR FULL RADIUS 2.9 (ORTHOPEDIC DISPOSABLE SUPPLIES) ×1 IMPLANT
SEAL CANN UNIV 5-8 DVNC XI (MISCELLANEOUS) ×8 IMPLANT
SEAL XI 5MM-8MM UNIVERSAL (MISCELLANEOUS) ×12
SET TUBE SMOKE EVAC HIGH FLOW (TUBING) ×3 IMPLANT
SET TUBE SUCT SHAVER OUTFL 24K (TUBING) ×3 IMPLANT
SET TUBE TIP INTRA-ARTICULAR (MISCELLANEOUS) ×3 IMPLANT
SOLUTION ELECTROLUBE (MISCELLANEOUS) ×3 IMPLANT
SPONGE LAP 18X18 RF (DISPOSABLE) IMPLANT
SPONGE LAP 4X18 RFD (DISPOSABLE) ×3 IMPLANT
STAPLER CANNULA SEAL DVNC XI (STAPLE) ×2 IMPLANT
STAPLER CANNULA SEAL XI (STAPLE) ×3
STOCKINETTE M/LG 89821 (MISCELLANEOUS) ×3 IMPLANT
STRAP ANKLE DISTRACTOR (MISCELLANEOUS) IMPLANT
STRAP SAFETY 5IN WIDE (MISCELLANEOUS) ×3 IMPLANT
SUT ETH BLK MONO 3 0 FS 1 12/B (SUTURE) ×4 IMPLANT
SUT ETHIBOND GREEN BRAID 0S 4 (SUTURE) ×12 IMPLANT
SUT ETHILON 4-0 (SUTURE) ×3
SUT ETHILON 4-0 FS2 18XMFL BLK (SUTURE) ×2
SUT MNCRL AB 4-0 PS2 18 (SUTURE) ×3 IMPLANT
SUT PDS II 3-0 (SUTURE) ×3 IMPLANT
SUT VIC AB 3-0 SH 27 (SUTURE) ×3
SUT VIC AB 3-0 SH 27X BRD (SUTURE) ×2 IMPLANT
SUT VIC AB 4-0 SH 27 (SUTURE) ×3
SUT VIC AB 4-0 SH 27XANBCTRL (SUTURE) ×2 IMPLANT
SUT VICRYL 0 AB UR-6 (SUTURE) ×6 IMPLANT
SUTURE ETHLN 4-0 FS2 18XMF BLK (SUTURE) ×2 IMPLANT
TAPE TRANSPORE STRL 2 31045 (GAUZE/BANDAGES/DRESSINGS) ×3 IMPLANT
TROCAR BALLN GELPORT 12X130M (ENDOMECHANICALS) ×3 IMPLANT
TUBING ARTHRO INFLOW-ONLY STRL (TUBING) ×3 IMPLANT
TUBING CONNECTING 10 (TUBING) ×3 IMPLANT
WAND ARTHRO PARAGON T2 (SURGICAL WAND) IMPLANT
WAND COVAC 50 IFS (MISCELLANEOUS) IMPLANT
WAND TOPAZ MICRO DEBRIDER (MISCELLANEOUS) IMPLANT

## 2019-10-30 NOTE — Anesthesia Preprocedure Evaluation (Signed)
Anesthesia Evaluation  Patient identified by MRN, date of birth, ID band Patient awake    Reviewed: Allergy & Precautions, H&P , NPO status , reviewed documented beta blocker date and time   Airway Mallampati: III  TM Distance: >3 FB Neck ROM: limited    Dental  (+) Partial Upper, Missing   Pulmonary former smoker,    Pulmonary exam normal        Cardiovascular hypertension, Normal cardiovascular exam     Neuro/Psych  Headaches, PSYCHIATRIC DISORDERS Anxiety Depression  Neuromuscular disease    GI/Hepatic neg GERD  ,  Endo/Other    Renal/GU      Musculoskeletal  (+) Arthritis , Fibromyalgia -  Abdominal   Peds  Hematology   Anesthesia Other Findings Past Medical History: No date: Anxiety No date: Arthritis No date: Depression No date: Headache     Comment:  migraine - last one 2016 No date: Hypertension     Comment:  reason that she was given [place] & she remarks that               her BP has been better she has been taking it.  No date: Positive skin test for tuberculosis     Comment:  told that she is a carrier, (treated age 38 and as               adult) Negative x-ray(per pt) No date: Wears contact lenses No date: Wears dentures     Comment:  partial upper No date: Wears hearing aid in right ear Past Surgical History: No date: ABDOMINAL HYSTERECTOMY No date: BACK SURGERY     Comment:  x3 previous cervical fusion & then removed hardware  No date: BREAST SURGERY; Right     Comment:  benign-  2016: HAND TENDON SURGERY; Right No date: JOINT REPLACEMENT; Right     Comment:  also had a partial on the L knee No date: LUMBAR FUSION No date: MYRINGOTOMY WITH TUBE PLACEMENT; Right No date: TONSILLECTOMY No date: TUBAL LIGATION     Comment:  had a 2nd. surgery for adhesions BMI    Body Mass Index: 31.87 kg/m     Reproductive/Obstetrics                             Anesthesia  Physical Anesthesia Plan  ASA: III  Anesthesia Plan: General   Post-op Pain Management:    Induction: Intravenous  PONV Risk Score and Plan: 3 and Ondansetron, Treatment may vary due to age or medical condition, Dexamethasone and Diphenhydramine  Airway Management Planned: Oral ETT  Additional Equipment:   Intra-op Plan:   Post-operative Plan: Extubation in OR  Informed Consent: I have reviewed the patients History and Physical, chart, labs and discussed the procedure including the risks, benefits and alternatives for the proposed anesthesia with the patient or authorized representative who has indicated his/her understanding and acceptance.     Dental Advisory Given  Plan Discussed with: CRNA  Anesthesia Plan Comments:         Anesthesia Quick Evaluation

## 2019-10-30 NOTE — Op Note (Signed)
Operative note   Surgeon:Louetta Hollingshead [employer] logistics/support/administrative officer: None    Preop diagnosis: 1.  Left ankle anterior impingement syndrome 2.  Left ankle instability    Postop diagnosis: Same    Procedure: 1.  Left ankle arthroscopy with debridement 2.  Brostrm Gould lateral ankle stabilization left ankle    EBL: Minimal    Anesthesia:local and general.  Local consisted of a total of 10 cc of 0.25% bupivacaine with epinephrine and 10 cc of plain lidocaine.    Hemostasis: Mid calf tourniquet inflated to 200 mmHg for 56 minutes    Specimen: None    Complications: None    Operative indications:Carol [name] is an 74 y.o. that presents today for surgical intervention.  The risks/benefits/alternatives/complications have been discussed and consent has been given.    Procedure:  Patient was brought into the OR and placed on the operating table in thesupine position. After anesthesia was obtained theleft lower extremity was prepped and draped in usual sterile fashion.  Attention was directed to the anteromedial anterolateral aspect of the left ankle.  2 small stab incisions were made creating portals.  The ankle arthroscopy set was then introduced into the entire aspect of the ankle.  Visualization was performed.  The medial gutter had minimal synovitis.  There was a large fibrotic band on the anterior aspect of the ankle consistent with the anterior ankle impingement syndrome.  Mild fibrosis to the lateral gutter was noted as well.  At this point to the anterolateral incision the shaver was introduced and all of the synovitis and fibrotic tissue was excised extensively.  Good removal of all tissue was noted.  The arthroscopy scope was then placed into the anterolateral portal.  The medial gutter was visualized.  All of synovitis was excised at this time.  Good range of motion of the joint was noted.  Minimal cartilage damage was noted.  At this time the arthroscopy instruments were removed.  Attention was  directed to the lateral aspect of the ankle where a small small incision was performed from the anterior talofibular ligament to the calcaneofibular ligament.  Sharp and blunt dissection carried down to the superficial retinaculum.  This was then retracted distally.  The anterior talofibular ligament and calcaneofibular ligament were then incised.  Next with then #1 Ethibond a pants over vest closure was performed to the deep tissue and ligamentous structures.  The extensor retinaculum was brought proximal and reanastomosed with an 0 Vicryl.  Final closure of the skin was performed with 3-0 Vicryl and 3-0 nylon for skin to all areas.  Patient was placed in neutral 90 degree position.  A bulky sterile dressing was applied.  Patient was then placed in an equalizer walker boot.  Patient is also having cholecystectomy performed.  Dr. Aleen Campi is performing this after my procedure was completed.    Patient tolerated the procedure and anesthesia well.  Was transported from the OR to the PACU with all vital signs stable and vascular status intact. To be discharged per routine protocol.  Will follow up in approximately 1 week in the outpatient clinic.

## 2019-10-30 NOTE — Transfer of Care (Signed)
Immediate Anesthesia Transfer of Care Note  Patient: Carol [name]  Procedure(s) Performed: XI ROBOTIC ASSISTED LAPAROSCOPIC CHOLECYSTECTOMY (N/A ) INDOCYANINE GREEN FLUORESCENCE IMAGING (ICG) (N/A ) BROSTRUM-GOULD LEFT ANKLE (Left Ankle) A-SCOPE/DEBRIDEMENT, EXTENSIVE, LEFT ANKLE (Left Ankle)  Patient Location: PACU  Anesthesia Type:General  Level of Consciousness: awake, alert  and oriented  Airway & Oxygen Therapy: Patient Spontanous Breathing  Post-op Assessment: Report given to RN and Post -op Vital signs reviewed and stable  Post vital signs: Reviewed and stable  Last Vitals:  Vitals Value Taken Time  BP 129/60 10/30/19 1053  Temp 36.6 C 10/30/19 1053  Pulse 81 10/30/19 1056  Resp 19 10/30/19 1056  SpO2 94 % 10/30/19 1056  Vitals shown include unvalidated device data.  Last Pain:  Vitals:   10/30/19 0616  TempSrc: Tympanic  PainSc: 0-No pain         Complications: No complications documented.

## 2019-10-30 NOTE — Discharge Instructions (Signed)
[facility]  POST OPERATIVE INSTRUCTIONS FOR DR. TROXLER, DR. FOWLER, AND DR. [facility] PODIATRY [facility]PART[facility]   1. Take your medication as prescribed.  Pain medication should be taken only as needed.  2. Keep the dressing clean, dry and intact.  3. Keep your foot elevated above the heart level for the first 48 hours.  4. Walking to the bathroom and brief periods of walking are acceptable, unless we have instructed you to be non-weight bearing.  5. Always wear your post-op shoe when walking.  Always use your crutches if you are to be non-weight bearing.  6. Do not take a shower. Baths are permissible as long as the foot is kept out of the water.   7. Every hour you are awake:  - Bend your knee 15 times. - Flex foot 15 times - Massage calf 15 times  8. Call [facility] ([phone]) if any of the following problems occur: - You develop a temperature or fever. - The bandage becomes saturated with blood. - Medication does not stop your pain. - Injury of the foot occurs. - Any symptoms of infection including redness, odor, or red streaks running from wound.  AMBULATORY SURGERY  DISCHARGE INSTRUCTIONS  1) The drugs that you were given will stay in your system until tomorrow so for the next 24 hours you should not: A) Drive an automobile B) Make any legal decisions C) Drink any alcoholic beverage  2) You may resume regular meals tomorrow.  Today it is better to start with liquids and gradually work up to solid foods. You may eat anything you prefer, but it is better to start with liquids, then soup and crackers, and gradually work up to solid foods.  3) Please notify your doctor immediately if you have any unusual bleeding, trouble breathing, redness and pain at the surgery site, drainage, fever, or pain not relieved by medication.  4) Additional Instructions:   Please contact your physician with any problems or Same  Day Surgery at [phone], Monday through Friday 6 am to 4 pm, or [place] [facility] number at [phone]. 

## 2019-10-30 NOTE — Anesthesia Procedure Notes (Signed)
Procedure Name: Intubation Performed by: Nekoda Chock L, CRNA Pre-anesthesia Checklist: Patient identified, Patient being monitored, Timeout performed, Emergency Drugs available and Suction available Patient Re-evaluated:Patient Re-evaluated prior to induction Oxygen Delivery Method: Circle system utilized Preoxygenation: Pre-oxygenation with 100% oxygen Induction Type: IV induction Ventilation: Mask ventilation without difficulty Laryngoscope Size: 3 and McGraph Grade View: Grade I Tube type: Oral Tube size: 7.0 mm Number of attempts: 1 Airway Equipment and Method: Stylet Placement Confirmation: ETT inserted through vocal cords under direct vision,  positive ETCO2 and breath sounds checked- equal and bilateral Secured at: 21 cm Tube secured with: Tape Dental Injury: Teeth and Oropharynx as per pre-operative assessment        

## 2019-10-30 NOTE — H&P (Signed)
HISTORY AND PHYSICAL INTERVAL NOTE:  10/30/2019  7:22 AM  Carol [name]  has presented today for surgery, with the diagnosis of symptomatic cholelithiasis, left ankle instability, sprain and anterior talofibular ligament, sprain tibiofibular ligament.  The various methods of treatment have been discussed with the patient.  No guarantees were given.  After consideration of risks, benefits and other options for treatment, the patient has consented to surgery.  I have reviewed the patients' chart and labs.     A history and physical examination was performed in my office.  The patient was reexamined.  There have been no changes to this history and physical examination.  Gwyneth Revels A

## 2019-10-30 NOTE — Op Note (Signed)
  Procedure Date:  10/30/2019  Pre-operative Diagnosis:  Symptomatic cholelithiasis  Post-operative Diagnosis:  Symptomatic cholelithiasis  Procedure:  Robotic assisted cholecystectomy with ICG FireFly cholangiogram  Surgeon:  Howie Ill, MD  Assistant:  Janice Coffin, PA-S  Anesthesia:  General endotracheal  Estimated Blood Loss:  5 ml  Specimens:  gallbladder  Complications:  None  Indications for Procedure:  This is a 74 y.o. female who presents with abdominal pain and workup revealing symptomatic cholelithiasis.  The benefits, complications, treatment options, and expected outcomes were discussed with the patient. The risks of bleeding, infection, recurrence of symptoms, failure to resolve symptoms, bile duct damage, bile duct leak, retained common bile duct stone, bowel injury, and need for further procedures were all discussed with the patient and she was willing to proceed.  Description of Procedure: The patient was correctly identified in the preoperative area and brought into the operating room.  The patient was placed supine with VTE prophylaxis in place.  Appropriate time-outs were performed.  Anesthesia was induced and the patient was intubated.  Appropriate antibiotics were infused.  Dr. Ether Griffins proceeded first with a left ankle arthroscopy with debridement and Brostrom Gould lateral ankle stabilization.  Please see his note for further details.    After Dr. Ether Griffins was done, the abdomen was prepped and draped in a sterile fashion. An infraumbilical incision was made. A cutdown technique was used to enter the abdominal cavity without injury, and a 12 mm robotic trocar was inserted.  Pneumoperitoneum was obtained with appropriate opening pressures.  Three 8-mm ports were placed in the mid abdomen at the level of the umbilicus under direct visualization.  The DaVinci platform was docked, camera targeted, and instruments placed under direct visualization.  The gallbladder was  identified.  The fundus was grasped and retracted cephalad.  Adhesions were lysed bluntly and with electrocautery. The infundibulum was grasped and retracted laterally, exposing the peritoneum overlying the gallbladder.  This was incised with electrocautery and extended on either side of the gallbladder.  FireFly cholangiogram was used multiple times to visualize the gallbladder, cystic duct, and common bile duct.  This allowed for improved visualization.  The cystic duct and cystic artery were clearly identified and bluntly dissected.  Both were clipped twice proximally and once distally, cutting in between.  The gallbladder was taken from the gallbladder fossa in a retrograde fashion with electrocautery. The gallbladder was placed in an Endocatch bag. The liver bed was inspected and any bleeding was controlled with electrocautery. The right upper quadrant was then inspected again revealing intact clips, no bleeding, and no ductal injury.  The 8 mm ports were removed under direct visualization and the 12 mm trocar was removed.  The Endocatch bag was brought out via the umbilical incision. The fascial opening was closed using 0 vicryl suture.  Local anesthetic was infused in all incisions and the incisions were closed with 4-0 Monocryl.  The wounds were cleaned and sealed with DermaBond.  The patient was emerged from anesthesia and extubated and brought to the recovery room for further management.  The patient tolerated the procedure well and all counts were correct at the end of the case.   Howie Ill, MD

## 2019-10-30 NOTE — Anesthesia Postprocedure Evaluation (Signed)
Anesthesia Post Note  Patient: Carol [name]  Procedure(s) Performed: XI ROBOTIC ASSISTED LAPAROSCOPIC CHOLECYSTECTOMY (N/A ) INDOCYANINE GREEN FLUORESCENCE IMAGING (ICG) (N/A ) BROSTRUM-GOULD LEFT ANKLE (Left Ankle) A-SCOPE/DEBRIDEMENT, EXTENSIVE, LEFT ANKLE (Left Ankle)  Patient location during evaluation: PACU Anesthesia Type: General Level of consciousness: awake and alert Pain management: pain level controlled Vital Signs Assessment: post-procedure vital signs reviewed and stable Respiratory status: spontaneous breathing, nonlabored ventilation, respiratory function stable and patient connected to nasal cannula oxygen Cardiovascular status: blood pressure returned to baseline and stable Postop Assessment: no apparent nausea or vomiting Anesthetic complications: no   No complications documented.   Last Vitals:  Vitals:   10/30/19 1207 10/30/19 1230  BP: (!) 108/47 (!) 120/47  Pulse: 75 80  Resp: 13 14  Temp: (!) 36.4 C   SpO2: 96% 99%    Last Pain:  Vitals:   10/30/19 1230  TempSrc:   PainSc: 3                  Berdine Rasmusson Garry Heater

## 2019-10-30 NOTE — Interval H&P Note (Signed)
History and Physical Interval Note:  10/30/2019 7:18 AM  Carol [name]  has presented today for surgery, with the diagnosis of symptomatic cholelithiasis, left ankle instability, sprain and anterior talofibular ligament, sprain tibiofibular ligament.  The various methods of treatment have been discussed with the patient and family. After consideration of risks, benefits and other options for treatment, the patient has consented to  Procedure(s): XI ROBOTIC ASSISTED LAPAROSCOPIC CHOLECYSTECTOMY (N/A) INDOCYANINE GREEN FLUORESCENCE IMAGING (ICG) (N/A) BROSTRUM-GOULD LEFT ANKLE (Left) A-SCOPE/DEBRIDEMENT, EXTENSIVE, LEFT ANKLE (Left) as a surgical intervention.  The patient's history has been reviewed, patient examined, no change in status, stable for surgery.  I have reviewed the patient's chart and labs.  Questions were answered to the patient's satisfaction.     Carol [name]

## 2019-10-31 LAB — SURGICAL PATHOLOGY

## 2019-11-04 DIAGNOSIS — S93492D Sprain of other ligament of left ankle, subsequent encounter: Secondary | ICD-10-CM | POA: Diagnosis not present

## 2019-11-04 DIAGNOSIS — S93432D Sprain of tibiofibular ligament of left ankle, subsequent encounter: Secondary | ICD-10-CM | POA: Diagnosis not present

## 2019-11-04 DIAGNOSIS — M25372 Other instability, left ankle: Secondary | ICD-10-CM | POA: Diagnosis not present

## 2019-11-12 ENCOUNTER — Other Ambulatory Visit: Payer: Self-pay

## 2019-11-12 ENCOUNTER — Ambulatory Visit (INDEPENDENT_AMBULATORY_CARE_PROVIDER_SITE_OTHER): Payer: PPO | Admitting: Surgery

## 2019-11-12 ENCOUNTER — Encounter: Payer: Self-pay | Admitting: Surgery

## 2019-11-12 VITALS — BP 138/80 | HR 73 | Temp 97.7°F | Resp 12 | Ht 63.0 in | Wt 178.0 lb

## 2019-11-12 DIAGNOSIS — J302 Other seasonal allergic rhinitis: Secondary | ICD-10-CM | POA: Insufficient documentation

## 2019-11-12 DIAGNOSIS — K8020 Calculus of gallbladder without cholecystitis without obstruction: Secondary | ICD-10-CM

## 2019-11-12 DIAGNOSIS — R21 Rash and other nonspecific skin eruption: Secondary | ICD-10-CM

## 2019-11-12 DIAGNOSIS — Z09 Encounter for follow-up examination after completed treatment for conditions other than malignant neoplasm: Secondary | ICD-10-CM

## 2019-11-12 DIAGNOSIS — M519 Unspecified thoracic, thoracolumbar and lumbosacral intervertebral disc disorder: Secondary | ICD-10-CM | POA: Insufficient documentation

## 2019-11-12 NOTE — Progress Notes (Signed)
11/12/2019  HPI: Carol [name] is a 74 y.o. female s/p robotic assisted cholecystectomy with ICG cholangiogram and left ankle surgery with Dr. Ether Griffins on 10/30/19.  She presents today for follow up.  She had an appointment with Dr. Ether Griffins yesterday and is healing and recovering well.  She denies any abdominal pain, nausea, or vomiting, and is tolerating a diet well and having bowel function.  However, she's had a rash over all the incisions and is itching quite a lot.  She uses Benadryl po which helps, but she's still itching.  She only needed pain medication for a short term after surgery and has not needed them recently.  The rash is only around the incisions rather than whole body.  Denies any breakdown of the incisions themselves, or any drainage from them.  Vital signs: BP 138/80   Pulse 73   Temp 97.7 F (36.5 C) (Oral)   Resp 12   Ht 5\' 3"  (1.6 m)   Wt 178 lb (80.7 kg)   SpO2 95%   BMI 31.53 kg/m    Physical Exam: Constitutional: No acute distress Abdomen:  Soft, non-distended, non-tender to palpation.  There is a peri-incisional rash extending about 2-3 cm around each incision, without any blistering.  Incisions themselves are intact without breakdown and without any drainage. MSK:  Patient has left ankle/foot boot/brace. Assessment/Plan: This is a 74 y.o. female s/p robotic assisted cholecystectomy and left ankle surgery.  --Discussed with her that she may have developed an allergic reaction to the adhesive (DermaBond).  I do not think this is a reaction to any of the pain meds as this is strictly localized to the incisions.  I was able to remove most of the remaining DermaBond and encouraged her to use hydrogen peroxide at home to gently try to remove any residue.  Also recommended that she try Benadryl ointment and Pepcid to help with her rash, and could use hydrocortisone ointment if there is no improvement with the above.  If there is any worsening, she should call 61 to  re-evaluate her to make sure there is no infection. --Follow up in about 2 weeks.   [place], MD [facility]

## 2019-11-12 NOTE — Patient Instructions (Addendum)
Start using hydrogen peroxide to help get rid of some of the excess glue. You may start taking Pepcid to help with the itching. If the Pepcid is working, stop the oral Benadryl and start using Benadryl ointment. If this does not help, you may start using a Hydrocortisone ointment. All of these can be purchased over the counter.   See your appointment below. If everything is perfect and the rash improves you may cancel the appointment. If it gets worst call our office sooner.  Call the office if you have any questions or concerns.   GENERAL POST-OPERATIVE PATIENT INSTRUCTIONS   WOUND CARE INSTRUCTIONS:  Keep a dry clean dressing on the wound if there is drainage. The initial bandage may be removed after 24 hours.  Once the wound has quit draining you may leave it open to air.  If clothing rubs against the wound or causes irritation and the wound is not draining you may cover it with a dry dressing during the daytime.  Try to keep the wound dry and avoid ointments on the wound unless directed to do so.  If the wound becomes bright red and painful or starts to drain infected material that is not clear, please contact your physician immediately.  If the wound is mildly pink and has a thick firm ridge underneath it, this is normal, and is referred to as a healing ridge.  This will resolve over the next 4-6 weeks.  BATHING: You may shower if you have been informed of this by your surgeon. However, Please do not submerge in a tub, hot tub, or pool until incisions are completely sealed or have been told by your surgeon that you may do so.  DIET:  You may eat any foods that you can tolerate.  It is a good idea to eat a high fiber diet and take in plenty of fluids to prevent constipation.  If you do become constipated you may want to take a mild laxative or take ducolax tablets on a daily basis until your bowel habits are regular.  Constipation can be very uncomfortable, along with straining, after recent  surgery.  ACTIVITY:  You are encouraged to cough and deep breath or use your incentive spirometer if you were given one, every 15-30 minutes when awake.  This will help prevent respiratory complications and low grade fevers post-operatively if you had a general anesthetic.  You may want to hug a pillow when coughing and sneezing to add additional support to the surgical area, if you had abdominal or chest surgery, which will decrease pain during these times.  You are encouraged to walk and engage in light activity for the next two weeks.  You should not lift more than 10-15 pounds, 4 to 6 weeks after surgery as it could put you at increased risk for complications.  Twenty pounds is roughly equivalent to a plastic bag of groceries. At that time- Listen to your body when lifting, if you have pain when lifting, stop and then try again in a few days. Soreness after doing exercises or activities of daily living is normal as you get back in to your normal routine.  MEDICATIONS:  Try to take narcotic medications and anti-inflammatory medications, such as tylenol, ibuprofen, naprosyn, etc., with food.  This will minimize stomach upset from the medication.  Should you develop nausea and vomiting from the pain medication, or develop a rash, please discontinue the medication and contact your physician.  You should not drive, make important decisions,  or operate machinery when taking narcotic pain medication.  SUNBLOCK Use sun block to incision area over the next year if this area will be exposed to sun. This helps decrease scarring and will allow you avoid a permanent darkened area over your incision.  QUESTIONS:  Please feel free to call our office if you have any questions, and we will be glad to assist you.

## 2019-11-14 ENCOUNTER — Encounter: Payer: PPO | Admitting: Physician Assistant

## 2019-11-26 ENCOUNTER — Encounter: Payer: Self-pay | Admitting: Surgery

## 2019-12-01 ENCOUNTER — Encounter: Payer: Self-pay | Admitting: Surgery

## 2019-12-16 DIAGNOSIS — M25372 Other instability, left ankle: Secondary | ICD-10-CM | POA: Diagnosis not present

## 2019-12-16 DIAGNOSIS — S93492D Sprain of other ligament of left ankle, subsequent encounter: Secondary | ICD-10-CM | POA: Diagnosis not present

## 2019-12-22 DIAGNOSIS — F17218 Nicotine dependence, cigarettes, with other nicotine-induced disorders: Secondary | ICD-10-CM | POA: Diagnosis not present

## 2019-12-22 DIAGNOSIS — M4184 Other forms of scoliosis, thoracic region: Secondary | ICD-10-CM | POA: Diagnosis not present

## 2019-12-22 DIAGNOSIS — M47814 Spondylosis without myelopathy or radiculopathy, thoracic region: Secondary | ICD-10-CM | POA: Diagnosis not present

## 2019-12-22 DIAGNOSIS — J449 Chronic obstructive pulmonary disease, unspecified: Secondary | ICD-10-CM | POA: Diagnosis not present

## 2019-12-22 DIAGNOSIS — R911 Solitary pulmonary nodule: Secondary | ICD-10-CM | POA: Diagnosis not present

## 2019-12-23 DIAGNOSIS — Z791 Long term (current) use of non-steroidal anti-inflammatories (NSAID): Secondary | ICD-10-CM | POA: Diagnosis not present

## 2019-12-23 DIAGNOSIS — M1811 Unilateral primary osteoarthritis of first carpometacarpal joint, right hand: Secondary | ICD-10-CM | POA: Diagnosis not present

## 2019-12-23 DIAGNOSIS — M8949 Other hypertrophic osteoarthropathy, multiple sites: Secondary | ICD-10-CM | POA: Diagnosis not present

## 2019-12-23 DIAGNOSIS — M1812 Unilateral primary osteoarthritis of first carpometacarpal joint, left hand: Secondary | ICD-10-CM | POA: Diagnosis not present

## 2020-01-05 DIAGNOSIS — K5903 Drug induced constipation: Secondary | ICD-10-CM | POA: Diagnosis not present

## 2020-01-05 DIAGNOSIS — F419 Anxiety disorder, unspecified: Secondary | ICD-10-CM | POA: Diagnosis not present

## 2020-01-05 DIAGNOSIS — F32A Depression, unspecified: Secondary | ICD-10-CM | POA: Diagnosis not present

## 2020-01-05 DIAGNOSIS — Z79899 Other long term (current) drug therapy: Secondary | ICD-10-CM | POA: Diagnosis not present

## 2020-01-27 DIAGNOSIS — M810 Age-related osteoporosis without current pathological fracture: Secondary | ICD-10-CM | POA: Diagnosis not present

## 2020-01-27 DIAGNOSIS — E559 Vitamin D deficiency, unspecified: Secondary | ICD-10-CM | POA: Diagnosis not present

## 2020-01-28 DIAGNOSIS — M5412 Radiculopathy, cervical region: Secondary | ICD-10-CM | POA: Diagnosis not present

## 2020-02-16 ENCOUNTER — Ambulatory Visit
Admission: RE | Admit: 2020-02-16 | Discharge: 2020-02-16 | Disposition: A | Discharge status: Home / Self Care | Payer: PPO | Source: Ambulatory Visit | Attending: Student in an Organized Health Care Education/Training Program | Admitting: Student in an Organized Health Care Education/Training Program

## 2020-02-16 ENCOUNTER — Encounter: Payer: Self-pay | Admitting: Student in an Organized Health Care Education/Training Program

## 2020-02-16 ENCOUNTER — Other Ambulatory Visit: Payer: Self-pay

## 2020-02-16 ENCOUNTER — Ambulatory Visit (HOSPITAL_BASED_OUTPATIENT_CLINIC_OR_DEPARTMENT_OTHER): Payer: PPO | Admitting: Student in an Organized Health Care Education/Training Program

## 2020-02-16 DIAGNOSIS — G894 Chronic pain syndrome: Secondary | ICD-10-CM

## 2020-02-16 DIAGNOSIS — M5416 Radiculopathy, lumbar region: Secondary | ICD-10-CM

## 2020-02-16 DIAGNOSIS — G8929 Other chronic pain: Secondary | ICD-10-CM

## 2020-02-16 MED ORDER — SODIUM CHLORIDE (PF) 0.9 % IJ SOLN
INTRAMUSCULAR | Status: AC
  Filled 2020-02-16: qty 10

## 2020-02-16 MED ORDER — SODIUM CHLORIDE 0.9% FLUSH
2.0000 mL | Freq: Once | INTRAVENOUS | Status: AC
  Administered 2020-02-16: 2 mL

## 2020-02-16 MED ORDER — DEXAMETHASONE SODIUM PHOSPHATE 10 MG/ML IJ SOLN
INTRAMUSCULAR | Status: AC
  Filled 2020-02-16: qty 1

## 2020-02-16 MED ORDER — ROPIVACAINE HCL 2 MG/ML IJ SOLN
INTRAMUSCULAR | Status: AC
  Filled 2020-02-16: qty 10

## 2020-02-16 MED ORDER — LIDOCAINE HCL 2 % IJ SOLN
20.0000 mL | Freq: Once | INTRAMUSCULAR | Status: AC
  Administered 2020-02-16: 400 mg

## 2020-02-16 MED ORDER — DEXAMETHASONE SODIUM PHOSPHATE 10 MG/ML IJ SOLN
10.0000 mg | Freq: Once | INTRAMUSCULAR | Status: AC
  Administered 2020-02-16: 10 mg

## 2020-02-16 MED ORDER — IOHEXOL 180 MG/ML  SOLN
INTRAMUSCULAR | Status: AC
  Filled 2020-02-16: qty 20

## 2020-02-16 MED ORDER — LIDOCAINE HCL 2 % IJ SOLN
INTRAMUSCULAR | Status: AC
  Filled 2020-02-16: qty 20

## 2020-02-16 MED ORDER — ROPIVACAINE HCL 2 MG/ML IJ SOLN
2.0000 mL | Freq: Once | INTRAMUSCULAR | Status: AC
  Administered 2020-02-16: 2 mL via EPIDURAL

## 2020-02-16 NOTE — Progress Notes (Signed)
PROVIDER NOTE: Information contained herein reflects review and annotations entered in association with encounter. Interpretation of such information and data should be left to medically-trained personnel. Information provided to patient can be located elsewhere in the medical record under "Patient Instructions". Document created using STT-dictation technology, any transcriptional errors that may result from process are unintentional.    Patient: Carol [name]  Service Category: Procedure  Provider: Edward Jolly, MD  DOB: [birth year]  DOS: 02/16/2020  Location: [facility]  MRN: [id number]  Setting: Ambulatory - outpatient  Referring Provider: Edward Jolly, MD  Type: Established Patient  Specialty: Interventional Pain Management  PCP: Myrene Buddy, NP   Primary Reason for Visit: Interventional Pain Management Treatment. CC: Back Pain (lower)  Procedure:          Anesthesia, Analgesia, Anxiolysis:  Type: Therapeutic Epidural Steroid Injection #2 (previously done 09/01/2019) Region: Caudal Level: Sacrococcygeal   Laterality: Midline       Type: Local Anesthesia  Local Anesthetic: Lidocaine 1-2%  Position: Prone   Indications: 1. Lumbar radiculopathy   2. Chronic radicular lumbar pain   3. Chronic pain syndrome    Pain Score: Pre-procedure: 4 /10 Post-procedure: 0-No pain/10   Pre-op Assessment:  Carol [name] is a 74 y.o. (year old), female patient, seen today for interventional treatment. She  has a past surgical history that includes Abdominal hysterectomy; Tonsillectomy; Breast surgery (Right); Tubal ligation; Myringotomy with tube placement (Right); Hand tendon surgery (Right, 2016); Lumbar fusion; Joint replacement (Right); Back surgery; Ankle reconstruction (Left, 10/30/2019); and Ankle arthroscopy (Left, 10/30/2019). Carol [name] has a current medication list which includes the following prescription(s): alendronate, calcium carbonate-vitamin d, cetirizine,  chlorthalidone, clindamycin, clonazepam, fluoxetine, fluticasone, meloxicam, psyllium-calcium, rizatriptan, rosuvastatin, sumatriptan, trazodone, vitamin d3, and potassium chloride, and the following Facility-Administered Medications: dexamethasone, lidocaine, ropivacaine (pf) 2 mg/ml (0.2%), and sodium chloride flush. Her primarily concern today is the Back Pain (lower)  Initial Vital Signs:  Pulse/HCG Rate: 78  Temp: (!) 97 F (36.1 C) Resp: 16 BP: (!) 137/58 SpO2: 100 %  BMI: Estimated body mass index is 30.47 kg/m as calculated from the following:   Height as of this encounter: 5\' 3"  (1.6 m).   Weight as of this encounter: 172 lb (78 kg).  Risk Assessment: Allergies: Reviewed. She is allergic to gentamicin, hydralazine, ibandronic acid, and penicillins.  Allergy Precautions: None required Coagulopathies: Reviewed. None identified.  Blood-thinner therapy: None at this time Active Infection(s): Reviewed. None identified. Carol [name] is afebrile  Site Confirmation: Carol [name] was asked to confirm the procedure and laterality before marking the site Procedure checklist: Completed Consent: Before the procedure and under the influence of no sedative(s), amnesic(s), or anxiolytics, the patient was informed of the treatment options, risks and possible complications. To fulfill our ethical and legal obligations, as recommended by the American Medical Association's Code of Ethics, I have informed the patient of my clinical impression; the nature and purpose of the treatment or procedure; the risks, benefits, and possible complications of the intervention; the alternatives, including doing nothing; the risk(s) and benefit(s) of the alternative treatment(s) or procedure(s); and the risk(s) and benefit(s) of doing nothing. The patient was provided information about the general risks and possible complications associated with the procedure. These may include, but are not limited to: failure to achieve  desired goals, infection, bleeding, organ or nerve damage, allergic reactions, paralysis, and death. In addition, the patient was informed of those risks and complications associated to Spine-related procedures, such as failure to  decrease pain; infection (i.e.: Meningitis, epidural or intraspinal abscess); bleeding (i.e.: epidural hematoma, subarachnoid hemorrhage, or any other type of intraspinal or peri-dural bleeding); organ or nerve damage (i.e.: Any type of peripheral nerve, nerve root, or spinal cord injury) with subsequent damage to sensory, motor, and/or autonomic systems, resulting in permanent pain, numbness, and/or weakness of one or several areas of the body; allergic reactions; (i.e.: anaphylactic reaction); and/or death. Furthermore, the patient was informed of those risks and complications associated with the medications. These include, but are not limited to: allergic reactions (i.e.: anaphylactic or anaphylactoid reaction(s)); adrenal axis suppression; blood sugar elevation that in diabetics may result in ketoacidosis or comma; water retention that in patients with history of congestive heart failure may result in shortness of breath, pulmonary edema, and decompensation with resultant heart failure; weight gain; swelling or edema; medication-induced neural toxicity; particulate matter embolism and blood vessel occlusion with resultant organ, and/or nervous system infarction; and/or aseptic necrosis of one or more joints. Finally, the patient was informed that Medicine is not an exact science; therefore, there is also the possibility of unforeseen or unpredictable risks and/or possible complications that may result in a catastrophic outcome. The patient indicated having understood very clearly. We have given the patient no guarantees and we have made no promises. Enough time was given to the patient to ask questions, all of which were answered to the patient's satisfaction. Carol [name] has  indicated that she wanted to continue with the procedure. Attestation: I, the ordering provider, attest that I have discussed with the patient the benefits, risks, side-effects, alternatives, likelihood of achieving goals, and potential problems during recovery for the procedure that I have provided informed consent. Date  Time: 02/16/2020 11:24 AM  Pre-Procedure Preparation:  Monitoring: As per clinic protocol. Respiration, ETCO2, SpO2, BP, heart rate and rhythm monitor placed and checked for adequate function Safety Precautions: Patient was assessed for positional comfort and pressure points before starting the procedure. Time-out: I initiated and conducted the "Time-out" before starting the procedure, as per protocol. The patient was asked to participate by confirming the accuracy of the "Time Out" information. Verification of the correct person, site, and procedure were performed and confirmed by me, the nursing staff, and the patient. "Time-out" conducted as per Joint Commission's Universal Protocol (UP.01.01.01). Time: 1150  Description of Procedure:          Target Area: Caudal Epidural Canal. Approach: Midline approach. Area Prepped: Entire Posterior Sacrococcygeal Region DuraPrep (Iodine Povacrylex [0.7% available iodine] and Isopropyl Alcohol, 74% w/w) Safety Precautions: Aspiration looking for blood return was conducted prior to all injections. At no point did we inject any substances, as a needle was being advanced. No attempts were made at seeking any paresthesias. Safe injection practices and needle disposal techniques used. Medications properly checked for expiration dates. SDV (single dose vial) medications used. Description of the Procedure: Protocol guidelines were followed. The patient was placed in position over the fluoroscopy table. The target area was identified and the area prepped in the usual manner. Skin & deeper tissues infiltrated with local anesthetic. Appropriate  amount of time allowed to pass for local anesthetics to take effect. The procedure needles were then advanced to the target area. Proper needle placement secured. Negative aspiration confirmed. Solution injected in intermittent fashion, asking for systemic symptoms every 0.5cc of injectate. The needles were then removed and the area cleansed, making sure to leave some of the prepping solution back to take advantage of its long term bactericidal properties. Vitals:  02/16/20 1128 02/16/20 1144 02/16/20 1154  BP: (!) 137/58 131/75 128/74  Pulse: 78 76 75  Resp: 16 15 16   Temp: (!) 97 F (36.1 C)    TempSrc: Temporal    SpO2: 100% 100% 99%  Weight: 172 lb (78 kg)    Height: 5\' 3"  (1.6 m)      Start Time: 1150 hrs. End Time: 1153 hrs. Materials:  Needle(s) Type: Epidural needle Gauge: 22G Length: 3.5-in Medication(s): Please see orders for medications and dosing details. 7 cc solution made of 3 cc of preservative-free saline, 3cc of 0.2% ropivacaine, 1 cc of Decadron 10 mg/cc.  Imaging Guidance (Spinal):          Type of Imaging Technique: Fluoroscopy Guidance (Spinal) Indication(s): Assistance in needle guidance and placement for procedures requiring needle placement in or near specific anatomical locations not easily accessible without such assistance. Exposure Time: Please see nurses notes. Contrast: Before injecting any contrast, we confirmed that the patient did not have an allergy to iodine, shellfish, or radiological contrast. Once satisfactory needle placement was completed at the desired level, radiological contrast was injected. Contrast injected under live fluoroscopy. No contrast complications. See chart for type and volume of contrast used. Fluoroscopic Guidance: I was personally present during the use of fluoroscopy. "Tunnel Vision Technique" used to obtain the best possible view of the target area. Parallax error corrected before commencing the procedure.  "Direction-depth-direction" technique used to introduce the needle under continuous pulsed fluoroscopy. Once target was reached, antero-posterior, oblique, and lateral fluoroscopic projection used confirm needle placement in all planes. Images permanently stored in EMR. Interpretation: I personally interpreted the imaging intraoperatively. Adequate needle placement confirmed in multiple planes. Appropriate spread of contrast into desired area was observed. No evidence of afferent or efferent intravascular uptake. No intrathecal or subarachnoid spread observed. Permanent images saved into the patient's record.  Antibiotic Prophylaxis:   Anti-infectives (From admission, onward)   None     Indication(s): None identified  Post-operative Assessment:  Post-procedure Vital Signs:  Pulse/HCG Rate: 75  Temp: (!) 97 F (36.1 C) Resp: 16 BP: 128/74 SpO2: 99 %  EBL: None  Complications: No immediate post-treatment complications observed by team, or reported by patient.  Note: The patient tolerated the entire procedure well. A repeat set of vitals were taken after the procedure and the patient was kept under observation following institutional policy, for this type of procedure. Post-procedural neurological assessment was performed, showing return to baseline, prior to discharge. The patient was provided with post-procedure discharge instructions, including a section on how to identify potential problems. Should any problems arise concerning this procedure, the patient was given instructions to immediately contact , at any time, without hesitation. In any case, we plan to contact the patient by telephone for a follow-up status report regarding this interventional procedure.  Comments:  No additional relevant information.  5 out of 5 strength bilateral lower extremity: Plantar flexion, dorsiflexion, knee flexion, knee extension.  Plan of Care  Orders:  Orders Placed This Encounter  Procedures   . DG PAIN CLINIC C-ARM 1-60 MIN NO REPORT    Intraoperative interpretation by procedural physician at [facility].    Standing Status:   Standing    Number of Occurrences:   1    Order Specific Question:   Reason for exam:    Answer:   Assistance in needle guidance and placement for procedures requiring needle placement in or near specific anatomical locations not easily accessible without such assistance.  Medications ordered for procedure: Meds ordered this encounter  Medications  . lidocaine (XYLOCAINE) 2 % (with pres) injection 400 mg  . ropivacaine (PF) 2 mg/mL (0.2%) (NAROPIN) injection 2 mL  . sodium chloride flush (NS) 0.9 % injection 2 mL  . dexamethasone (DECADRON) injection 10 mg   Medications administered: Earlyne Iba. [place]lun had no medications administered during this visit.  See the medical record for exact dosing, route, and time of administration.  Follow-up plan:   Return in about 24 days (around 03/11/2020) for Post Procedure Evaluation, virtual.     Status post caudal ESI on 09/01/2019, 02/16/2020.  Interlaminar window seem appropriate for spinal cord stimulator trial   Recent Visits No visits were found meeting these conditions. Showing recent visits within past 90 days and meeting all other requirements Today's Visits Date Type Provider Dept  02/16/20 Procedure visit Edward Jolly, MD [facility]  Showing today's visits and meeting all other requirements Future Appointments No visits were found meeting these conditions. Showing future appointments within next 90 days and meeting all other requirements  Disposition: Discharge home  Discharge (Date  Time): 02/16/2020; 1158 hrs.   Primary Care Physician: Myrene Buddy, NP Location: [facility] Note by: Edward Jolly, MD Date: 02/16/2020; Time: 12:01 PM  Disclaimer:  Medicine is not an exact science. The only guarantee in medicine is that nothing is  guaranteed. It is important to note that the decision to proceed with this intervention was based on the information collected from the patient. The Data and conclusions were drawn from the patient's questionnaire, the interview, and the physical examination. Because the information was provided in large part by the patient, it cannot be guaranteed that it has not been purposely or unconsciously manipulated. Every effort has been made to obtain as much relevant data as possible for this evaluation. It is important to note that the conclusions that lead to this procedure are derived in large part from the available data. Always take into account that the treatment will also be dependent on availability of resources and existing treatment guidelines, considered by other Pain Management Practitioners as being common knowledge and practice, at the time of the intervention. For Medico-Legal purposes, it is also important to point out that variation in procedural techniques and pharmacological choices are the acceptable norm. The indications, contraindications, technique, and results of the above procedure should only be interpreted and judged by a Board-Certified Interventional Pain Specialist with extensive familiarity and expertise in the same exact procedure and technique.

## 2020-02-16 NOTE — Progress Notes (Signed)
Safety precautions to be maintained throughout the outpatient stay will include: orient to surroundings, keep bed in low position, maintain call bell within reach at all times, provide assistance with transfer out of bed and ambulation.  

## 2020-02-17 ENCOUNTER — Telehealth: Payer: Self-pay

## 2020-02-17 DIAGNOSIS — S93492D Sprain of other ligament of left ankle, subsequent encounter: Secondary | ICD-10-CM | POA: Diagnosis not present

## 2020-02-17 DIAGNOSIS — S93432D Sprain of tibiofibular ligament of left ankle, subsequent encounter: Secondary | ICD-10-CM | POA: Diagnosis not present

## 2020-02-17 NOTE — Telephone Encounter (Signed)
Denies any needs at this time. Instructed to call if needed. 

## 2020-02-25 DIAGNOSIS — M5412 Radiculopathy, cervical region: Secondary | ICD-10-CM | POA: Diagnosis not present

## 2020-02-25 DIAGNOSIS — R030 Elevated blood-pressure reading, without diagnosis of hypertension: Secondary | ICD-10-CM | POA: Insufficient documentation

## 2020-03-10 ENCOUNTER — Telehealth: Payer: Self-pay

## 2020-03-10 ENCOUNTER — Encounter: Payer: Self-pay | Admitting: Student in an Organized Health Care Education/Training Program

## 2020-03-10 NOTE — Telephone Encounter (Signed)
LM for patient to call us for virtual appointment questions.  

## 2020-03-11 ENCOUNTER — Encounter: Payer: Self-pay | Admitting: Student in an Organized Health Care Education/Training Program

## 2020-03-11 ENCOUNTER — Other Ambulatory Visit: Payer: Self-pay

## 2020-03-11 ENCOUNTER — Ambulatory Visit
Payer: PPO | Attending: Student in an Organized Health Care Education/Training Program | Admitting: Student in an Organized Health Care Education/Training Program

## 2020-03-11 DIAGNOSIS — G8929 Other chronic pain: Secondary | ICD-10-CM

## 2020-03-11 DIAGNOSIS — G894 Chronic pain syndrome: Secondary | ICD-10-CM

## 2020-03-11 DIAGNOSIS — M5416 Radiculopathy, lumbar region: Secondary | ICD-10-CM

## 2020-03-11 NOTE — Progress Notes (Signed)
Patient: Carol [name]  Service Category: E/M  Provider: Gillis Santa, MD  DOB: 1945/06/14  DOS: 03/11/2020  Location: Office  MRN: [id number]  Setting: Ambulatory outpatient  Referring Provider: Sallee Lange, *  Type: Established Patient  Specialty: Interventional Pain Management  PCP: Sallee Lange, NP  Location: Home  Delivery: TeleHealth     Virtual Encounter - Pain Management PROVIDER NOTE: Information contained herein reflects review and annotations entered in association with encounter. Interpretation of such information and data should be left to medically-trained personnel. Information provided to patient can be located elsewhere in the medical record under "Patient Instructions". Document created using STT-dictation technology, any transcriptional errors that may result from process are unintentional.    Contact & Pharmacy Preferred: [phone] Home: [phone] (home) Mobile: [phone] (mobile) E-mail: [email or web address]  [address] [place], [place] [address] [place] [address] Phone: [phone] Fax: [phone]   Pre-screening  Ms. Batte offered "in-person" vs "virtual" encounter. She indicated preferring virtual for this encounter.   Reason COVID-19*  Social distancing based on CDC and AMA recommendations.   I contacted Pershing Cox on 03/11/2020 via telephone.      I clearly identified myself as Gillis Santa, MD. I verified that I was speaking with the correct person using two identifiers (Name: Carol [name], and date of birth: 1945-12-06).  Consent I sought verbal advanced consent from Pershing Cox for virtual visit interactions. I informed Ms. Cleaver of possible security and privacy concerns, risks, and limitations associated with providing "not-in-person" medical evaluation and management services. I also informed Ms. Rote of the availability of "in-person"  appointments. Finally, I informed her that there would be a charge for the virtual visit and that she could be  personally, fully or partially, financially responsible for it. Ms. Lenahan expressed understanding and agreed to proceed.   Historic Elements   Carol [name] is a 74 y.o. year old, female patient evaluated today after our last contact on 02/16/2020. Carol [name]  has a past medical history of Anxiety, Arthritis, Depression, Headache, Hypertension, Positive skin test for tuberculosis, Wears contact lenses, Wears dentures, and Wears hearing aid in right ear. She also  has a past surgical history that includes Abdominal hysterectomy; Tonsillectomy; Breast surgery (Right); Tubal ligation; Myringotomy with tube placement (Right); Hand tendon surgery (Right, 2016); Lumbar fusion; Joint replacement (Right); Back surgery; Ankle reconstruction (Left, 10/30/2019); and Ankle arthroscopy (Left, 10/30/2019). Carol [name] has a current medication list which includes the following prescription(s): alendronate, calcium carbonate-vitamin d, cetirizine, chlorthalidone, clindamycin, clonazepam, fluoxetine, fluticasone, meloxicam, psyllium-calcium, rizatriptan, rosuvastatin, sumatriptan, trazodone, vitamin d3, and potassium chloride. She  reports that she quit smoking about 44 years ago. Her smoking use included cigarettes. She has never used smokeless tobacco. She reports current drug use. Frequency: 1.00 time per week. Drug: Marijuana. She reports that she does not drink alcohol. Carol [name] is allergic to gentamicin, celecoxib, hydralazine, ibandronic acid, and penicillins.   HPI  Today, she is being contacted for a post-procedure assessment.   Post-Procedure Evaluation  Procedure (02/16/2020):   Type: Therapeutic Epidural Steroid Injection #2 (previously done 09/01/2019) Region: Caudal Level: Sacrococcygeal   Laterality: Midline     Sedation: Please see nurses note.  Effectiveness during initial hour after  procedure(Ultra-Short Term Relief): 100 %   Local anesthetic used: Long-acting (4-6 hours) Effectiveness: Defined as any analgesic benefit obtained secondary to the administration  of local anesthetics. This carries significant diagnostic value as to the etiological location, or anatomical origin, of the pain. Duration of benefit is expected to coincide with the duration of the local anesthetic used.  Effectiveness during initial 4-6 hours after procedure(Short-Term Relief): 100 %  Long-term benefit: Defined as any relief past the pharmacologic duration of the local anesthetics.  Effectiveness past the initial 6 hours after procedure(Long-Term Relief): 85 %   Current benefits: Defined as benefit that persist at this time.   Analgesia:  >75% relief Function: Ms. Horseman reports improvement in function ROM: Ms. Ade reports improvement in ROM   Laboratory Chemistry Profile   Renal Lab Results  Component Value Date   BUN 19 09/24/2019   CREATININE 0.97 09/24/2019   BCR 18 07/19/2016   GFRAA >60 09/24/2019   GFRNONAA 58 (L) 09/24/2019     Hepatic Lab Results  Component Value Date   AST 30 09/24/2019   ALT 34 09/24/2019   ALBUMIN 4.3 09/24/2019   ALKPHOS 57 09/24/2019   LIPASE 31 09/24/2019     Electrolytes Lab Results  Component Value Date   NA 137 09/24/2019   K 3.5 10/23/2019   CL 97 (L) 09/24/2019   CALCIUM 9.1 09/24/2019   MG 1.6 (L) 09/06/2012     Bone Lab Results  Component Value Date   VD25OH 56.6 04/26/2017     Inflammation (CRP: Acute Phase) (ESR: Chronic Phase) No results found for: CRP, ESRSEDRATE, LATICACIDVEN     Note: Above Lab results reviewed.   Assessment  The primary encounter diagnosis was Lumbar radiculopathy. Diagnoses of Chronic radicular lumbar pain and Chronic pain syndrome were also pertinent to this visit.  Plan of Care  Orders:  Orders Placed This Encounter  Procedures  . Caudal Epidural Injection    Standing Status:   Standing     Number of Occurrences:   1    Standing Expiration Date:   03/11/2021    Scheduling Instructions:     Laterality: Midline     Level(s): Sacrococcygeal canal (Tailbone area)     Sedation: Patient's choice      TIMEFRAME: PRN procedure. (Ms. Keough will call when needed.)    Order Specific Question:   Where will this procedure be performed?    Answer:   [facility]   Follow-up plan:   Return if symptoms worsen or fail to improve.     Status post caudal ESI on 09/01/2019, 02/16/2020.  Interlaminar window seem appropriate for spinal cord stimulator trial    Recent Visits Date Type Provider Dept  02/16/20 Procedure visit Gillis Santa, MD [facility]  Showing recent visits within past 90 days and meeting all other requirements Today's Visits Date Type Provider Dept  03/11/20 Telemedicine Gillis Santa, MD [facility]  Showing today's visits and meeting all other requirements Future Appointments No visits were found meeting these conditions. Showing future appointments within next 90 days and meeting all other requirements  I discussed the assessment and treatment plan with the patient. The patient was provided an opportunity to ask questions and all were answered. The patient agreed with the plan and demonstrated an understanding of the instructions.  Patient advised to call back or seek an in-person evaluation if the symptoms or condition worsens.  Duration of encounter: 15 minutes.  Note by: Gillis Santa, MD Date: 03/11/2020; Time: 2:10 PM

## 2020-07-02 ENCOUNTER — Other Ambulatory Visit (HOSPITAL_COMMUNITY): Payer: Self-pay | Admitting: Specialist

## 2020-07-02 DIAGNOSIS — R911 Solitary pulmonary nodule: Secondary | ICD-10-CM

## 2020-07-06 ENCOUNTER — Other Ambulatory Visit: Payer: Self-pay

## 2020-07-06 ENCOUNTER — Emergency Department
Admission: EM | Admit: 2020-07-06 | Discharge: 2020-07-06 | Disposition: A | Discharge status: Home / Self Care | Payer: Medicare Other | Attending: Emergency Medicine | Admitting: Emergency Medicine

## 2020-07-06 ENCOUNTER — Emergency Department: Payer: Medicare Other

## 2020-07-06 DIAGNOSIS — M79651 Pain in right thigh: Secondary | ICD-10-CM | POA: Diagnosis not present

## 2020-07-06 DIAGNOSIS — I10 Essential (primary) hypertension: Secondary | ICD-10-CM | POA: Insufficient documentation

## 2020-07-06 DIAGNOSIS — E876 Hypokalemia: Secondary | ICD-10-CM | POA: Diagnosis not present

## 2020-07-06 DIAGNOSIS — Z79899 Other long term (current) drug therapy: Secondary | ICD-10-CM | POA: Insufficient documentation

## 2020-07-06 DIAGNOSIS — R072 Precordial pain: Secondary | ICD-10-CM | POA: Diagnosis not present

## 2020-07-06 DIAGNOSIS — Z87891 Personal history of nicotine dependence: Secondary | ICD-10-CM | POA: Insufficient documentation

## 2020-07-06 DIAGNOSIS — Z96653 Presence of artificial knee joint, bilateral: Secondary | ICD-10-CM | POA: Diagnosis not present

## 2020-07-06 DIAGNOSIS — R0789 Other chest pain: Secondary | ICD-10-CM

## 2020-07-06 LAB — BASIC METABOLIC PANEL
Anion gap: 12 (ref 5–15)
BUN: 15 mg/dL (ref 8–23)
CO2: 25 mmol/L (ref 22–32)
Calcium: 9.7 mg/dL (ref 8.9–10.3)
Chloride: 98 mmol/L (ref 98–111)
Creatinine, Ser: 0.99 mg/dL (ref 0.44–1.00)
GFR, Estimated: >60 mL/min (ref >60)
Glucose, Bld: 107 mg/dL — ABNORMAL HIGH (ref 70–99)
Potassium: 2.4 mmol/L — CL (ref 3.5–5.1)
Sodium: 135 mmol/L (ref 135–145)

## 2020-07-06 LAB — CBC
HCT: 40.4 % (ref 36.0–46.0)
Hemoglobin: 14 g/dL (ref 12.0–15.0)
MCH: 30.4 pg (ref 26.0–34.0)
MCHC: 34.7 g/dL (ref 30.0–36.0)
MCV: 87.6 fL (ref 80.0–100.0)
Platelets: 257 10*3/uL (ref 150–400)
RBC: 4.61 MIL/uL (ref 3.87–5.11)
RDW: 13.7 % (ref 11.5–15.5)
WBC: 9.2 10*3/uL (ref 4.0–10.5)
nRBC: 0 % (ref 0.0–0.2)

## 2020-07-06 LAB — TROPONIN I (HIGH SENSITIVITY)
Troponin I (High Sensitivity): 6 ng/L (ref <18)
Troponin I (High Sensitivity): 6 ng/L (ref <18)

## 2020-07-06 LAB — LIPASE, BLOOD: Lipase: 47 U/L (ref 11–51)

## 2020-07-06 LAB — MAGNESIUM: Magnesium: 1.8 mg/dL (ref 1.7–2.4)

## 2020-07-06 MED ORDER — POTASSIUM CHLORIDE CRYS ER 20 MEQ PO TBCR
40.0000 meq | EXTENDED_RELEASE_TABLET | Freq: Once | ORAL | Status: AC
  Administered 2020-07-06: 40 meq via ORAL
  Filled 2020-07-06: qty 2

## 2020-07-06 MED ORDER — POTASSIUM CHLORIDE 10 MEQ/100ML IV SOLN
10.0000 meq | INTRAVENOUS | Status: AC
  Administered 2020-07-06 (×2): 10 meq via INTRAVENOUS
  Filled 2020-07-06 (×2): qty 100

## 2020-07-06 NOTE — Discharge Instructions (Signed)
Please follow up with your PCP to have your potassium rechecked and discuss going back on potassium supplementation. Return to the ED with any further chest pains, particularly with fevers, passing out, vomiting.

## 2020-07-06 NOTE — ED Triage Notes (Signed)
Pt c/o substernal chest pain with SOB, off balance, and nausea since yesterday.

## 2020-07-06 NOTE — ED Provider Notes (Signed)
[facility] Emergency Department Provider Note ____________________________________________   Event Date/Time   First MD Initiated Contact with Patient 07/06/20 1628     (approximate)  I have reviewed the triage vital signs and the nursing notes.  HISTORY  Chief Complaint Chest Pain   HPI Carol [name] is a 75 y.o. femalewho presents to the ED for evaluation of chest pain  Chart review indicates history of HTN, HLD, depression and anxiety, migraine headaches and seasonal allergies. Patient is on chlorthalidone and reports previously taking potassium supplementation many years ago, but then told she no longer needs it.  Presents to the ED for evaluation of aching and tender substernal chest pain since yesterday.  She reports the pain has been constant since yesterday, is worse when she palpates the area, feels aching and cramping in nature.  Denies associated shortness of breath, cough, fevers, trauma, rash.  Further reports the sensation of tenderness/crampiness to her right thigh that also was atraumatic.  Denies recent medication changes and reports continued compliance with her chlorthalidone.   Past Medical History:  Diagnosis Date  . Anxiety   . Arthritis   . Depression   . Headache    migraine - last one 2016  . Hypertension    reason that she was given Hygroton & she remarks that her BP has been better she has been taking it.   Marland Kitchen Positive skin test for tuberculosis    told that she is a carrier, (treated age 32 and as adult) Negative x-ray(per pt)  . Wears contact lenses   . Wears dentures    partial upper  . Wears hearing aid in right ear     Patient Active Problem List   Diagnosis Date Noted  . Lumbar disc disease 11/12/2019  . Seasonal allergies 11/12/2019  . Symptomatic cholelithiasis   . Lumbar radiculopathy 09/01/2019  . Chronic pain syndrome 09/01/2019  . History of total right knee replacement 11/11/2018  . Leg pain, left  10/03/2017  . Obesity (BMI 30.0-34.9) 05/25/2017  . Acquired genu varum 11/30/2016  . Depression 08/25/2016  . Vitamin D deficiency 07/20/2016  . B12 deficiency 07/20/2016  . Gait instability 07/19/2016  . Hypertension 07/19/2016  . Hyperlipidemia 07/19/2016  . Osteopenia of spine 07/19/2016  . History of lumbar fusion 07/19/2016  . Hx of fusion of cervical spine 07/19/2016  . Migraine headache 07/19/2016  . Anxiety disorder 07/19/2016  . Allergic rhinitis 07/19/2016  . Constipation 07/19/2016  . Lung nodules 07/19/2016  . History of positive PPD 07/19/2016  . Patent pressure equalization (PE) tube on right side 07/19/2016  . Varicose veins of both lower extremities with pain 05/17/2016  . Chronic venous insufficiency 05/17/2016  . Degenerative spondylolisthesis 03/21/2016  . De Quervain's disease (radial styloid tenosynovitis) 03/07/2015  . Ganglion cyst of dorsum of right wrist 03/07/2015  . Status post left partial knee replacement 04/07/2014  . Fibromyalgia 02/26/2013  . OA (osteoarthritis) 11/21/2012    Past Surgical History:  Procedure Laterality Date  . ABDOMINAL HYSTERECTOMY    . ANKLE ARTHROSCOPY Left 10/30/2019   Procedure: A-SCOPE/DEBRIDEMENT, EXTENSIVE, LEFT ANKLE;  Surgeon: Gwyneth Revels, DPM;  Location: [facility] ORS;  Service: Podiatry;  Laterality: Left;  . ANKLE RECONSTRUCTION Left 10/30/2019   Procedure: BROSTRUM-GOULD LEFT ANKLE;  Surgeon: Gwyneth Revels, DPM;  Location: [facility] ORS;  Service: Podiatry;  Laterality: Left;  . BACK SURGERY     x3 previous cervical fusion & then removed hardware   . BREAST SURGERY Right  benign-   . HAND TENDON SURGERY Right 2016  . JOINT REPLACEMENT Right    also had a partial on the L knee  . LUMBAR FUSION    . MYRINGOTOMY WITH TUBE PLACEMENT Right   . TONSILLECTOMY    . TUBAL LIGATION     had a 2nd. surgery for adhesions    Prior to Admission medications   Medication Sig Start Date End Date Taking? Authorizing Provider   alendronate (FOSAMAX) 70 MG tablet Take 70 mg by mouth once a week.  08/01/19 07/31/20  [provider]  Calcium Carbonate-Vitamin D (CALTRATE 600+D PO) Take 1 tablet by mouth daily.     [provider]  cetirizine (ZYRTEC) 10 MG tablet Take 10 mg by mouth daily.    [provider]  chlorthalidone (HYGROTON) 50 MG tablet Take 50 mg by mouth daily.     [provider]  clindamycin (CLEOCIN) 150 MG capsule Take 600 mg by mouth See admin instructions. Take 600 mg by mouth 1 hour prior to dental appointment 03/07/16   [provider]  clonazePAM (KLONOPIN) 0.5 MG tablet Take 0.5 mg by mouth daily as needed for anxiety.  08/14/19   [provider]  FLUoxetine (PROZAC) 20 MG capsule Take 40 mg by mouth daily.  08/23/19   [provider]  fluticasone (FLONASE) 50 MCG/ACT nasal spray Place 2 sprays into both nostrils daily as needed for allergies.     [provider]  meloxicam (MOBIC) 15 MG tablet Take 15 mg by mouth daily. 01/05/19   [provider]  potassium chloride (KLOR-CON) 10 MEQ tablet Take 10 mEq by mouth in the morning and at bedtime.  02/04/19 02/04/20  [provider]  Psyllium-Calcium (FIBER PLUS CALCIUM PO) Take 1 tablet by mouth in the morning and at bedtime.    [provider]  rizatriptan (MAXALT) 10 MG tablet Take 10 mg by mouth as needed for migraine.     [provider]  rosuvastatin (CRESTOR) 5 MG tablet Take 5 mg by mouth daily. 11/19/17   [provider]  SUMAtriptan (IMITREX) 100 MG tablet Take 1 tablet (100 mg total) by mouth daily as needed for migraine. May repeat in 2 hours if headache persists or recurs. 11/23/16   Plonk, Chrissie NoaWilliam, MD  traZODone (DESYREL) 50 MG tablet Take 50 mg by mouth at bedtime as needed for sleep.  07/01/19 06/30/20  [provider]  Vitamin D, Cholecalciferol, 50 MCG (2000 UT) CAPS Take 2,000 Units by mouth daily.     [provider]     Allergies Gentamicin, Celecoxib, Hydralazine, Ibandronic acid, and Penicillins  Family History  Problem Relation Age of Onset  . Aneurysm Mother   . Lung cancer Brother     Social History Social History   Tobacco Use  . Smoking status: Former Smoker    Types: Cigarettes    Quit date: 03/15/1976    Years since quitting: 44.3  . Smokeless tobacco: Never Used  Substance Use Topics  . Alcohol use: No  . Drug use: Yes    Frequency: 1.0 times per week    Types: Marijuana    Comment: occasionally     Review of Systems  Constitutional: No fever/chills Eyes: No visual changes. ENT: No sore throat. Cardiovascular: Positive for chest pain. Respiratory: Denies shortness of breath. Gastrointestinal: No abdominal pain.  No nausea, no vomiting.  No diarrhea.  No constipation. Genitourinary: Negative for dysuria. Musculoskeletal: Negative for back pain.  Positive  for cramping right leg pain that is atraumatic Skin: Negative for rash. Neurological: Negative for headaches, focal weakness or numbness.  ____________________________________________   PHYSICAL EXAM:  VITAL SIGNS: Vitals:   07/06/20 1730 07/06/20 1830  BP: 129/62 129/65  Pulse: 82 83  Resp: 18 13  Temp:    SpO2: 98% 99%     Constitutional: Alert and oriented. Well appearing and in no acute distress. Eyes: Conjunctivae are normal. PERRL. EOMI. Head: Atraumatic. Nose: No congestion/rhinnorhea. Mouth/Throat: Mucous membranes are moist.  Oropharynx non-erythematous. Neck: No stridor. No cervical spine tenderness to palpation. Cardiovascular: Normal rate, regular rhythm. Grossly normal heart sounds.  Good peripheral circulation. Respiratory: Normal respiratory effort.  No retractions. Lungs CTAB. Gastrointestinal: Soft , nondistended, nontender to palpation. No CVA tenderness. Musculoskeletal: No lower extremity tenderness nor edema.  No joint effusions. No signs of acute trauma. Tenderness to palpation to  her midsternal chest, reproducing her symptoms.  No overlying skin changes, signs of rash or herpes zoster. Neurologic:  Normal speech and language. No gross focal neurologic deficits are appreciated. No gait instability noted. Skin:  Skin is warm, dry and intact. No rash noted. Psychiatric: Mood and affect are normal. Speech and behavior are normal.  ____________________________________________   LABS (all labs ordered are listed, but only abnormal results are displayed)  Labs Reviewed  BASIC METABOLIC PANEL - Abnormal; Notable for the following components:      Result Value   Potassium 2.4 (*)    Glucose, Bld 107 (*)    All other components within normal limits  CBC  LIPASE, BLOOD  MAGNESIUM  TROPONIN I (HIGH SENSITIVITY)  TROPONIN I (HIGH SENSITIVITY)   ____________________________________________  12 Lead EKG  Sinus rhythm, rate of 92 bpm.  Normal axis and intervals.  No evidence of acute ischemia. ____________________________________________  RADIOLOGY  ED MD interpretation: 2 view CXR reviewed by me without evidence of acute cardiopulmonary pathology.  Official radiology report(s): DG Chest 2 View  Result Date: 07/06/2020 CLINICAL DATA:  Substernal chest pain with shortness of breath. EXAM: CHEST - 2 VIEW COMPARISON:  03/25/2016 FINDINGS: The lungs are clear without focal pneumonia, edema, pneumothorax or pleural effusion. Interstitial markings are diffusely coarsened with chronic features. The cardiopericardial silhouette is within normal limits for size. The visualized bony structures of the thorax show no acute abnormality. Telemetry leads overlie the chest. IMPRESSION: No active cardiopulmonary disease. Electronically Signed   By: Kennith Center M.D.   On: 07/06/2020 17:08    ____________________________________________   PROCEDURES and INTERVENTIONS  Procedure(s) performed (including Critical Care):  .1-3 Lead EKG Interpretation Performed by: Delton Prairie,  MD Authorized by: Delton Prairie, MD     Interpretation: normal     ECG rate:  80   ECG rate assessment: normal     Rhythm: sinus rhythm     Ectopy: none     Conduction: normal      Medications  potassium chloride SA (KLOR-CON) CR tablet 40 mEq (40 mEq Oral Given 07/06/20 1738)  potassium chloride 10 mEq in 100 mL IVPB (0 mEq Intravenous Stopped 07/06/20 1837)  potassium chloride SA (KLOR-CON) CR tablet 40 mEq (40 mEq Oral Given 07/06/20 1847)    ____________________________________________   MDM / ED COURSE   75 year old woman without cardiac history presents to the ED with atypical chest pain, likely due to hypokalemia, amenable to repletion and outpatient management.  Normal vitals.  Exam with reproducible chest pain and otherwise well-appearing patient without distress, neurologic or vascular deficits.  No signs  of trauma, skin changes or herpes zoster.  Work-up demonstrates hypokalemia to 2.5 is a likely source of her symptoms.  No evidence of ACS, PTX or further acute pathology.  Resolving symptoms with potassium supplementation.  Will follow-up with PCP and return precautions for the ED were discussed.   Clinical Course as of 07/06/20 2042  Tue Jul 06, 2020  1843 Reassessed.  Patient reports improving symptoms.  We report 1 additional potassium tablets and likely outpatient management afterwards.  We discussed following up with PCP for potassium recheck.  We discussed possibly going back on regular supplements due to her chlorthalidone use.  We discussed return precautions for the ED. [DS]    Clinical Course User Index [DS] Delton Prairie, MD    ____________________________________________   FINAL CLINICAL IMPRESSION(S) / ED DIAGNOSES  Final diagnoses:  Other chest pain  Hypokalemia     ED Discharge Orders    None       Linville Decarolis Katrinka Blazing   Note:  This document was prepared using Dragon voice recognition software and may include unintentional dictation errors.    Delton Prairie, MD 07/06/20 2043

## 2020-07-12 ENCOUNTER — Ambulatory Visit: Payer: Medicare Other | Admitting: Student in an Organized Health Care Education/Training Program

## 2020-07-17 IMAGING — CT CT HEAD WITHOUT CONTRAST
4 of 5 series · 15 of 47 positions shown, 17 images · non-contrast
Comparison: None.

CLINICAL DATA: Pain and ataxia following fall

EXAM:
CT HEAD WITHOUT CONTRAST
TECHNIQUE: Contiguous axial images were obtained from the base of the skull
through the vertex without intravenous contrast.

[Series 2: head wo · axial · 0.39mm/px · z∈[+391,+471]mm · 4 of 28 slices shown]
[im 6/28  brain]
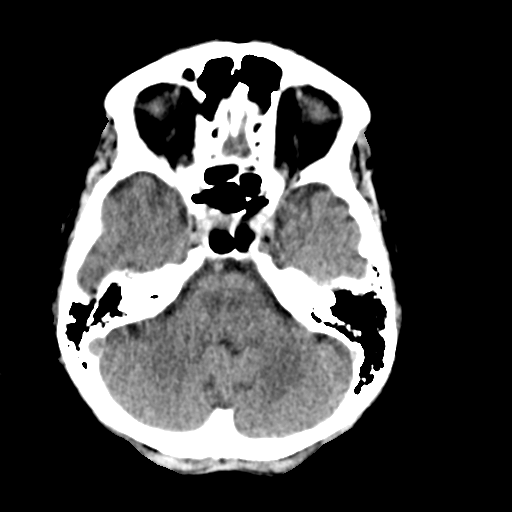
[im 11/28  brain]
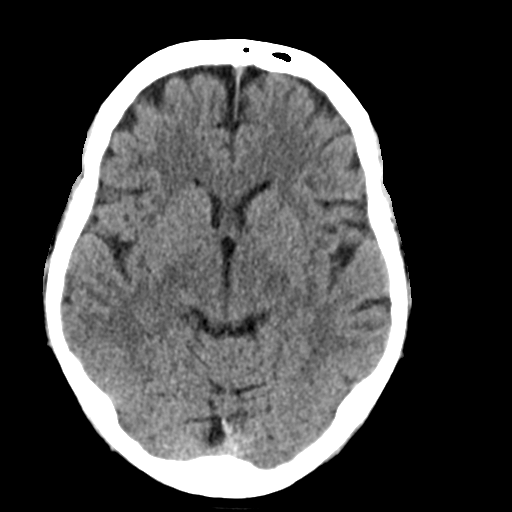
[im 17/28  brain]
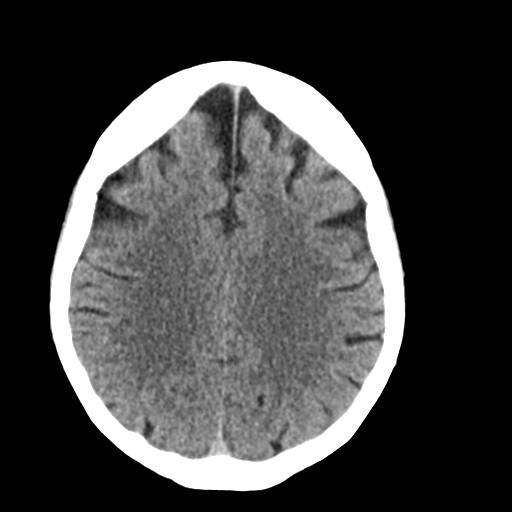
[im 22/28  brain]
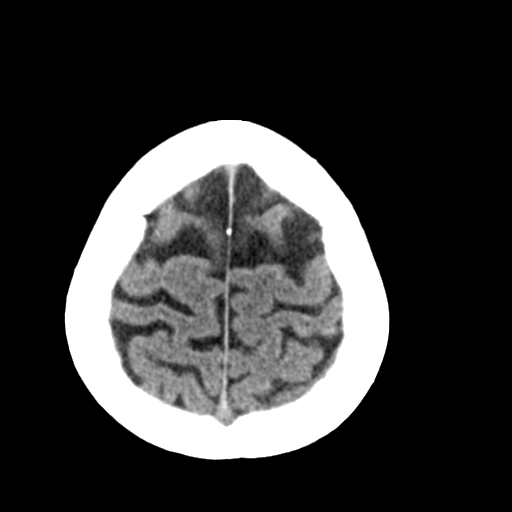

[Series 4: head wo recon · axial · 0.34mm/px · z∈[+392,+486]mm · 5 of 29 slices shown, 7 images]
[im 5/29  brain]
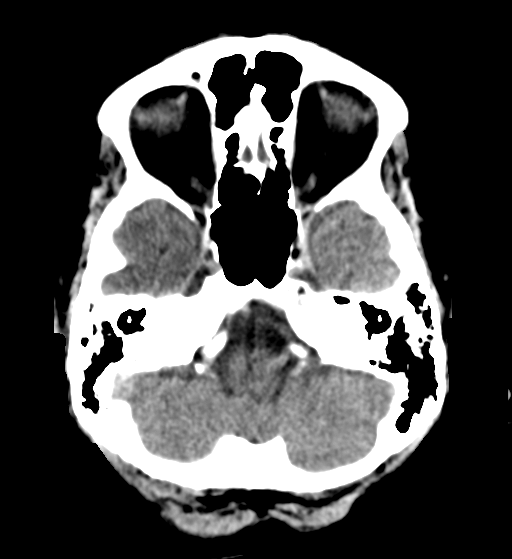
[im 5/29  bone]
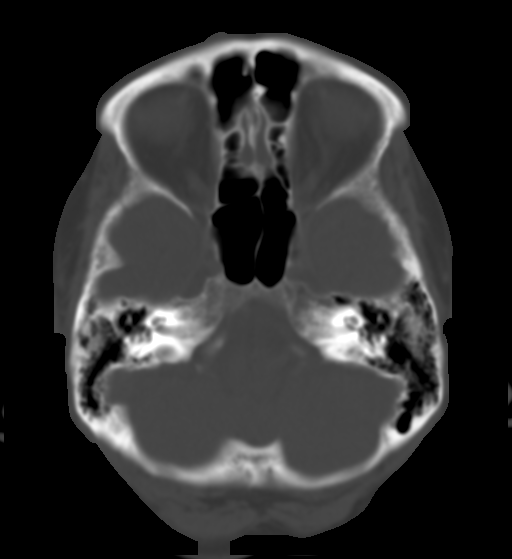
[im 10/29  brain]
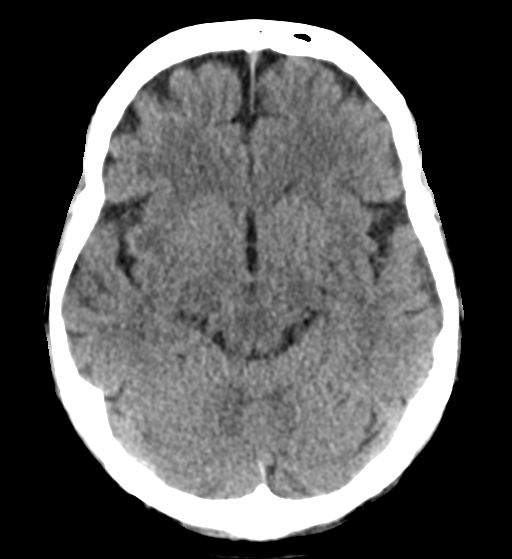
[im 15/29  brain]
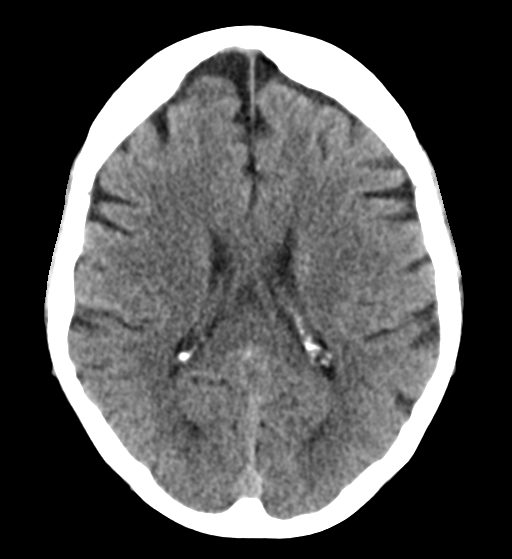
[im 19/29  brain]
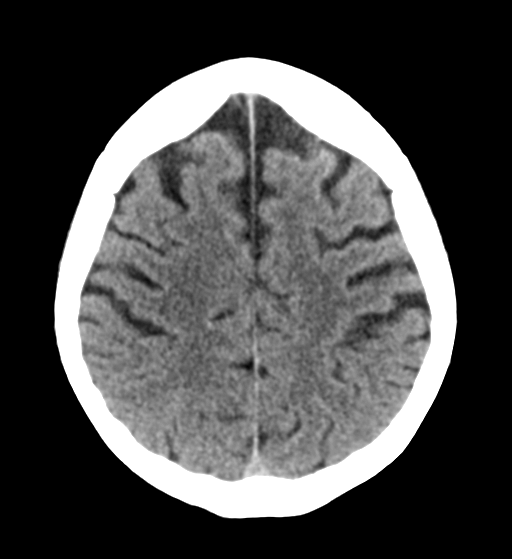
[im 24/29  brain]
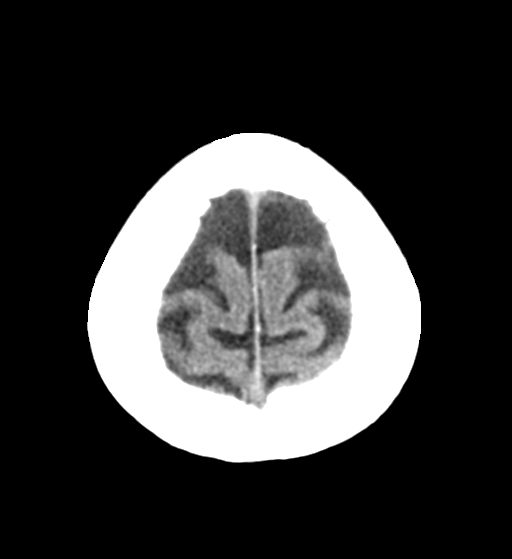
[im 24/29  bone]
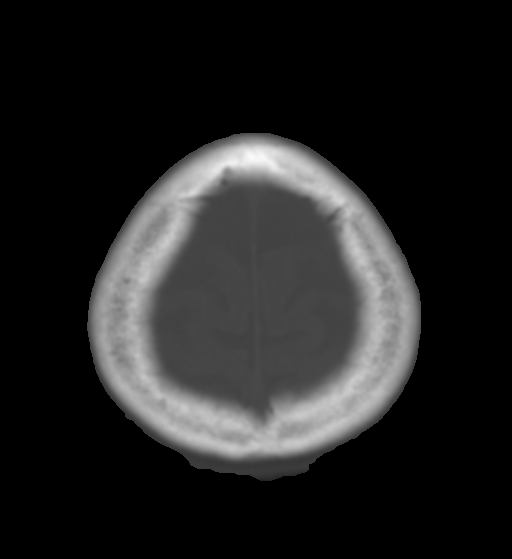

[Series 6: coronal soft tissue · coronal · 0.28mm/px · 3 of 67 slices shown]
[im 23/67  brain]
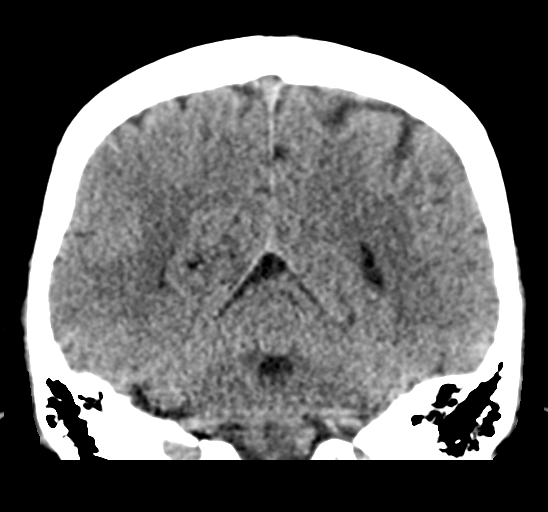
[im 30/67  brain]
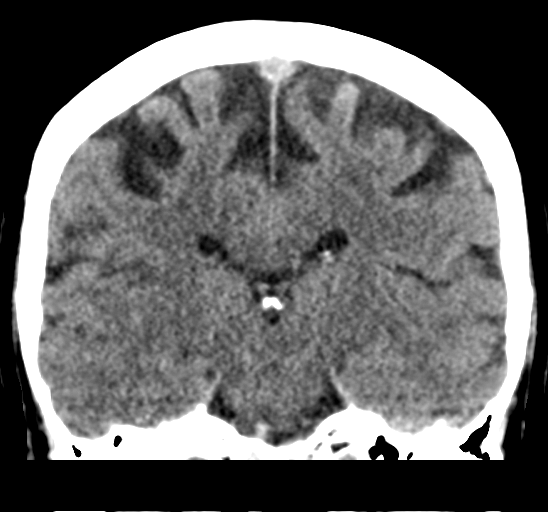
[im 37/67  brain]
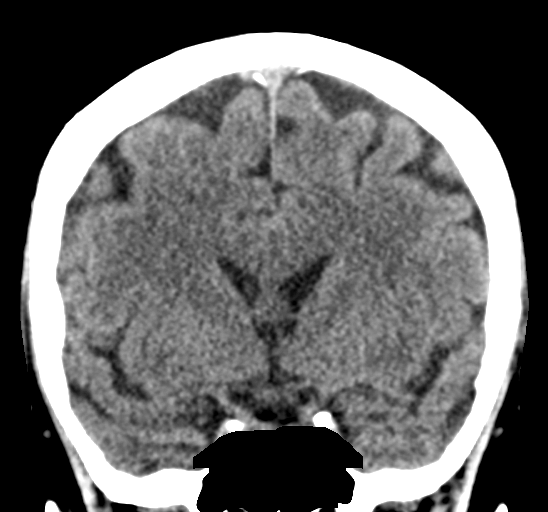

[Series 7: sagittal soft tissue · sagittal · 0.30mm/px · 3 of 66 slices shown]
[im 22/66  brain]
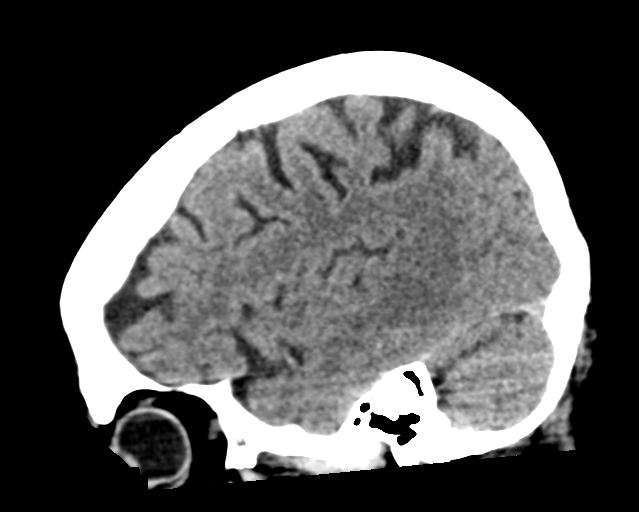
[im 33/66  brain]
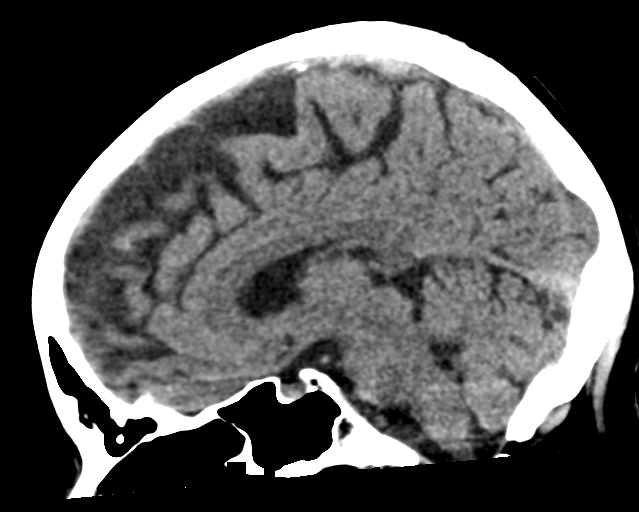
[im 44/66  brain]
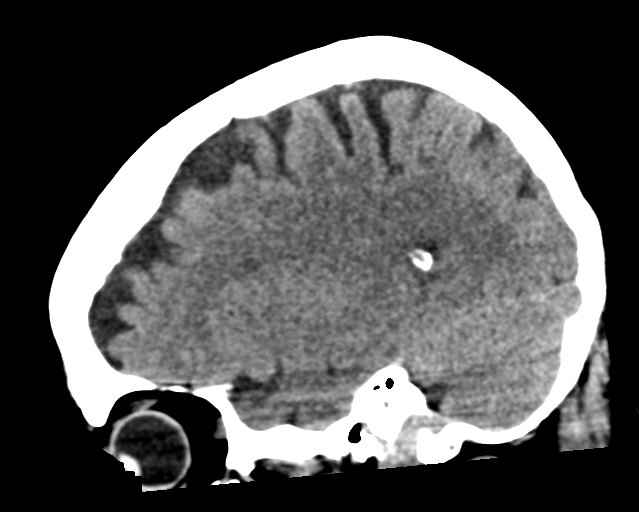

[15 of 47 positions shown; findings below may reference images not displayed]

FINDINGS: Brain: The ventricles are normal in size and configuration. There is
frontal atrophy bilaterally. There is no intracranial mass,
hemorrhage, extra-axial fluid collection, or midline shift. Brain
parenchyma appears unremarkable. No acute infarct evident.

Vascular: There is no hyperdense vessel. There is calcification in
each carotid siphon. There is calcification in the distal left
vertebral artery.

Skull: The bony calvarium appears intact.

Sinuses/Orbits: There is postoperative change in the right ethmoid
region. There is mucosal thickening in several ethmoid air cells.
Other visualized paranasal sinuses appear unremarkable. Visualized
orbits appear symmetric bilaterally.

Other: Visualized mastoid air cells are clear.
IMPRESSION: There is frontal lobe atrophy. Ventricles normal in size and
configuration. Brain parenchyma appears unremarkable. No mass or
hemorrhage.

There are foci of arterial vascular calcification. There is mucosal
thickening in several ethmoid air cells.

## 2020-08-04 DIAGNOSIS — S129XXA Fracture of neck, unspecified, initial encounter: Secondary | ICD-10-CM | POA: Insufficient documentation

## 2020-08-13 ENCOUNTER — Encounter: Payer: Self-pay | Admitting: *Deleted

## 2020-10-28 ENCOUNTER — Ambulatory Visit: Payer: Medicare Other | Admitting: Gastroenterology

## 2020-11-18 DIAGNOSIS — U071 COVID-19: Secondary | ICD-10-CM

## 2020-11-18 HISTORY — DX: COVID-19: U07.1

## 2020-12-02 ENCOUNTER — Ambulatory Visit: Payer: Medicare Other | Admitting: Student in an Organized Health Care Education/Training Program

## 2020-12-23 ENCOUNTER — Other Ambulatory Visit: Payer: Self-pay

## 2020-12-23 ENCOUNTER — Encounter: Payer: Self-pay | Admitting: Gastroenterology

## 2020-12-23 ENCOUNTER — Encounter: Payer: Self-pay | Admitting: Ophthalmology

## 2020-12-23 ENCOUNTER — Ambulatory Visit: Payer: Medicare Other | Admitting: Gastroenterology

## 2020-12-23 VITALS — BP 139/82 | HR 106 | Temp 97.6°F | Wt 173.8 lb

## 2020-12-23 DIAGNOSIS — K5904 Chronic idiopathic constipation: Secondary | ICD-10-CM | POA: Diagnosis not present

## 2020-12-23 NOTE — Progress Notes (Signed)
Carol Repress, MD [address]  [address]  [place], [place] [address]  Main: [phone]  Fax: [phone]    Gastroenterology Consultation  Referring Provider:     Myrene [name], * Primary Care Physician:  Carol Buddy, NP Primary Gastroenterologist:  Dr. Arlyss [name] Reason for Consultation:     Chronic constipation        HPI:   Carol [name] is a 75 y.o. female referred by Dr. Bayard [name], Carol Fiscal, NP  for consultation & management of chronic constipation.  Patient reports that for approximately 2 to 3 years she has been suffering from infrequent bowel movements.  She has BM every 1 to 2 weeks and then she takes laxative.  Her stool starts of hard on BM stool scale 1, subsequently 6 or 7.  The cycle repeats.  She thinks her diet is devoid of fiber.  She is taking psyllium with calcium.  She does not drink enough water.  She does consume sweet tea daily.  Patient denies any abdominal pain, however has significant abdominal bloating.  She does not smoke or drink alcohol.  TSH is normal.  No evidence of anemia.  She did have a bowel movement this morning.  Patient has not tried MiraLAX  NSAIDs: None  Antiplts/Anticoagulants/Anti thrombotics: None  GI Procedures: Colonoscopy 01/17/2012 Normal  Past Medical History:  Diagnosis Date   Anxiety    Arthritis    COVID-19 11/2020   symptom free since 12/08/20   Depression    Headache    migraine - last one 2016   Hypertension    reason that she was given Hygroton & she remarks that her BP has been better she has been taking it.    Multilevel degenerative disc disease    Positive skin test for tuberculosis    told that she is a carrier, (treated age 58 and as adult) Negative x-ray(per pt)   Wears contact lenses    Wears dentures    partial upper   Wears hearing aid in right ear     Past Surgical History:  Procedure Laterality Date   ABDOMINAL HYSTERECTOMY     ANKLE ARTHROSCOPY Left 10/30/2019    Procedure: A-SCOPE/DEBRIDEMENT, EXTENSIVE, LEFT ANKLE;  Surgeon: Gwyneth Revels, DPM;  Location: [facility] ORS;  Service: Podiatry;  Laterality: Left;   ANKLE RECONSTRUCTION Left 10/30/2019   Procedure: BROSTRUM-GOULD LEFT ANKLE;  Surgeon: Gwyneth Revels, DPM;  Location: [facility] ORS;  Service: Podiatry;  Laterality: Left;   BACK SURGERY     x3 previous cervical fusion & then removed hardware    BREAST SURGERY Right    benign-    HAND TENDON SURGERY Right 2016   JOINT REPLACEMENT Right    also had a partial on the L knee   LUMBAR FUSION     MYRINGOTOMY WITH TUBE PLACEMENT Right    TONSILLECTOMY     TUBAL LIGATION     had a 2nd. surgery for adhesions    Current Outpatient Medications:    alendronate (FOSAMAX) 70 MG tablet, Take 70 mg by mouth once a week., Disp: , Rfl:    Calcium Carbonate-Vitamin D (CALTRATE 600+D PO), Take 1 tablet by mouth daily. , Disp: , Rfl:    cetirizine (ZYRTEC) 10 MG tablet, Take 10 mg by mouth daily., Disp: , Rfl:    chlorthalidone (HYGROTON) 50 MG tablet, Take 25 mg by mouth daily., Disp: , Rfl:    clindamycin (CLEOCIN) 150 MG capsule, Take 600 mg  by mouth See admin instructions. Take 600 mg by mouth 1 hour prior to dental appointment, Disp: , Rfl:    clonazePAM (KLONOPIN) 0.5 MG tablet, Take 0.5 mg by mouth daily as needed for anxiety. , Disp: , Rfl:    cyclobenzaprine (FLEXERIL) 10 MG tablet, Take by mouth., Disp: , Rfl:    diclofenac Sodium (VOLTAREN) 1 % GEL, Apply topically as needed., Disp: , Rfl:    FLUoxetine (PROZAC) 40 MG capsule, TAKE 1 CAPSULE(40 MG) BY MOUTH EVERY DAY, Disp: , Rfl:    fluticasone (FLONASE) 50 MCG/ACT nasal spray, Place 2 sprays into both nostrils daily as needed for allergies. , Disp: , Rfl:    glucose 4 GM chewable tablet, Chew 1 tablet by mouth as needed for low blood sugar., Disp: , Rfl:    KLOR-CON M20 20 MEQ tablet, Take 20 mEq by mouth 2 (two) times daily., Disp: , Rfl:    meloxicam (MOBIC) 15 MG tablet, Take 15 mg by mouth daily.,  Disp: , Rfl:    potassium chloride (KLOR-CON) 10 MEQ tablet, Take 10 mEq by mouth in the morning and at bedtime. , Disp: , Rfl:    Psyllium-Calcium (FIBER PLUS CALCIUM PO), Take 1 tablet by mouth in the morning and at bedtime., Disp: , Rfl:    pyridOXINE (VITAMIN B-6) 100 MG tablet, Take 100 mg by mouth daily., Disp: , Rfl:    rizatriptan (MAXALT) 10 MG tablet, Take 10 mg by mouth as needed for migraine. , Disp: , Rfl:    rosuvastatin (CRESTOR) 5 MG tablet, Take 5 mg by mouth daily., Disp: , Rfl: 1   SUMAtriptan (IMITREX) 100 MG tablet, Take 1 tablet (100 mg total) by mouth daily as needed for migraine. May repeat in 2 hours if headache persists or recurs., Disp: 10 tablet, Rfl: 2   traZODone (DESYREL) 50 MG tablet, Take 50 mg by mouth at bedtime as needed for sleep. , Disp: , Rfl:    Vitamin D, Cholecalciferol, 50 MCG (2000 UT) CAPS, Take 2,000 Units by mouth daily. , Disp: , Rfl:     Family History  Problem Relation Age of Onset   Aneurysm Mother    Lung cancer Brother      Social History   Tobacco Use   Smoking status: Former    Types: Cigarettes    Quit date: 03/15/1976    Years since quitting: 44.8   Smokeless tobacco: Never  Vaping Use   Vaping Use: Never used  Substance Use Topics   Alcohol use: No   Drug use: Yes    Frequency: 1.0 times per week    Types: Marijuana    Comment: occasionally     Allergies as of 12/23/2020 - Review Complete 12/23/2020  Allergen Reaction Noted   Gentamicin Rash, Itching, and Other (See Comments) 11/21/2012   Celecoxib Other (See Comments) 01/27/2020   Hydralazine Nausea And Vomiting 10/03/2013   Ibandronic acid Other (See Comments) 09/16/2013   Oxycodone hcl  12/23/2020   Penicillins Hives and Other (See Comments) 09/16/2013    Review of Systems:    All systems reviewed and negative except where noted in HPI.   Physical Exam:  BP 139/82 (BP Location: Left Arm, Patient Position: Sitting, Cuff Size: Normal)   Pulse (!) 106   Temp  97.6 F (36.4 C) (Oral)   Wt 173 lb 12.8 oz (78.8 kg)   BMI 30.79 kg/m  No LMP recorded. Patient has had a hysterectomy.  General:   Alert,  Well-developed,  well-nourished, pleasant and cooperative in NAD Head:  Normocephalic and atraumatic. Eyes:  Sclera clear, no icterus.   Conjunctiva pink. Ears:  Normal auditory acuity. Nose:  No deformity, discharge, or lesions. Mouth:  No deformity or lesions,oropharynx pink & moist. Neck:  Supple; no masses or thyromegaly. Lungs:  Respirations even and unlabored.  Clear throughout to auscultation.   No wheezes, crackles, or rhonchi. No acute distress. Heart:  Regular rate and rhythm; no murmurs, clicks, rubs, or gallops. Abdomen:  Normal bowel sounds. Soft, non-tender and non-distended without masses, hepatosplenomegaly or hernias noted.  No guarding or rebound tenderness.   Rectal: Not performed Msk:  Symmetrical without gross deformities. Good, equal movement & strength bilaterally. Pulses:  Normal pulses noted. Extremities:  No clubbing or edema.  No cyanosis. Neurologic:  Alert and oriented x3;  grossly normal neurologically. Skin:  Intact without significant lesions or rashes. No jaundice. Psych:  Alert and cooperative. Normal mood and affect.  Imaging Studies: Reviewed  Assessment and Plan:   ASNA MULDROW is a 75 y.o. female with BMI 30.8, history of osteoporosis, hypertension, hyperlipidemia, COPD is seen in consultation for chronic constipation  Chronic constipation with abdominal bloating TSH normal, no evidence of anemia Discussed about high-fiber diet, information provided Reiterated on adequate intake of water, advised to significantly cut back on sweet tea Trial of Linzess 145 MCG daily, samples provided.  Patient will call our office if the samples work  Screening colonoscopy in 2024  Follow up as needed   Carol Repress, MD

## 2020-12-23 NOTE — Patient Instructions (Signed)
High-Fiber Eating Plan °Fiber, also called dietary fiber, is a type of carbohydrate. It is found foods such as fruits, vegetables, whole grains, and beans. A high-fiber diet can have many health benefits. Your health care provider may recommend a high-fiber diet to help: °Prevent constipation. Fiber can make your bowel movements more regular. °Lower your cholesterol. °Relieve the following conditions: °Inflammation of veins in the anus (hemorrhoids). °Inflammation of specific areas of the digestive tract (uncomplicated diverticulosis). °A problem of the large intestine, also called the colon, that sometimes causes pain and diarrhea (irritable bowel syndrome, or IBS). °Prevent overeating as part of a weight-loss plan. °Prevent heart disease, type 2 diabetes, and certain cancers. °What are tips for following this plan? °Reading food labels ° °Check the nutrition facts label on food products for the amount of dietary fiber. Choose foods that have 5 grams of fiber or more per serving. °The goals for recommended daily fiber intake include: °Men (age 50 or younger): 34-38 g. °Men (over age 50): 28-34 g. °Women (age 50 or younger): 25-28 g. °Women (over age 50): 22-25 g. °Your daily fiber goal is _____________ g. °Shopping °Choose whole fruits and vegetables instead of processed forms, such as apple juice or applesauce. °Choose a wide variety of high-fiber foods such as avocados, lentils, oats, and kidney beans. °Read the nutrition facts label of the foods you choose. Be aware of foods with added fiber. These foods often have high sugar and sodium amounts per serving. °Cooking °Use whole-grain flour for baking and cooking. °Cook with brown rice instead of white rice. °Meal planning °Start the day with a breakfast that is high in fiber, such as a cereal that contains 5 g of fiber or more per serving. °Eat breads and cereals that are made with whole-grain flour instead of refined flour or white flour. °Eat brown rice, bulgur  wheat, or millet instead of white rice. °Use beans in place of meat in soups, salads, and pasta dishes. °Be sure that half of the grains you eat each day are whole grains. °General information °You can get the recommended daily intake of dietary fiber by: °Eating a variety of fruits, vegetables, grains, nuts, and beans. °Taking a fiber supplement if you are not able to take in enough fiber in your diet. It is better to get fiber through food than from a supplement. °Gradually increase how much fiber you consume. If you increase your intake of dietary fiber too quickly, you may have bloating, cramping, or gas. °Drink plenty of water to help you digest fiber. °Choose high-fiber snacks, such as berries, raw vegetables, nuts, and popcorn. °What foods should I eat? °Fruits °Berries. Pears. Apples. Oranges. Avocado. Prunes and raisins. Dried figs. °Vegetables °Sweet potatoes. Spinach. Kale. Artichokes. Cabbage. Broccoli. Cauliflower. Green peas. Carrots. Squash. °Grains °Whole-grain breads. Multigrain cereal. Oats and oatmeal. Brown rice. Barley. Bulgur wheat. Millet. Quinoa. Bran muffins. Popcorn. Rye wafer crackers. °Meats and other proteins °Navy beans, kidney beans, and pinto beans. Soybeans. Split peas. Lentils. Nuts and seeds. °Dairy °Fiber-fortified yogurt. °Beverages °Fiber-fortified soy milk. Fiber-fortified orange juice. °Other foods °Fiber bars. °The items listed above may not be a complete list of recommended foods and beverages. Contact a dietitian for more information. °What foods should I avoid? °Fruits °Fruit juice. Cooked, strained fruit. °Vegetables °Fried potatoes. Canned vegetables. Well-cooked vegetables. °Grains °White bread. Pasta made with refined flour. White rice. °Meats and other proteins °Fatty cuts of meat. Fried chicken or fried fish. °Dairy °Milk. Yogurt. Cream cheese. Sour cream. °Fats and   oils °Butters. °Beverages °Soft drinks. °Other foods °Cakes and pastries. °The items listed above may  not be a complete list of foods and beverages to avoid. Talk with your dietitian about what choices are best for you. °Summary °Fiber is a type of carbohydrate. It is found in foods such as fruits, vegetables, whole grains, and beans. °A high-fiber diet has many benefits. It can help to prevent constipation, lower blood cholesterol, aid weight loss, and reduce your risk of heart disease, diabetes, and certain cancers. °Increase your intake of fiber gradually. Increasing fiber too quickly may cause cramping, bloating, and gas. Drink plenty of water while you increase the amount of fiber you consume. °The best sources of fiber include whole fruits and vegetables, whole grains, nuts, seeds, and beans. °This information is not intended to replace advice given to you by your health care provider. Make sure you discuss any questions you have with your health care provider. °Document Revised: 07/10/2019 Document Reviewed: 07/10/2019 °Elsevier Patient Education © 2022 [employer] ° °

## 2020-12-27 ENCOUNTER — Ambulatory Visit: Payer: Medicare Other | Admitting: Gastroenterology

## 2020-12-28 ENCOUNTER — Ambulatory Visit: Payer: Medicare Other | Admitting: Student in an Organized Health Care Education/Training Program

## 2020-12-30 ENCOUNTER — Telehealth: Payer: Self-pay

## 2020-12-30 ENCOUNTER — Other Ambulatory Visit: Payer: Self-pay

## 2020-12-30 MED ORDER — LINACLOTIDE 145 MCG PO CAPS: 145.0000 ug | ORAL_CAPSULE | Freq: Every day | ORAL | 2 refills | Status: DC

## 2020-12-30 NOTE — Telephone Encounter (Signed)
Patient is calling she was given Linzess samples in the office on 12/23/2020. She states they are working and would like a prescription to be called in to [facility] on [address].

## 2020-12-30 NOTE — Discharge Instructions (Signed)

## 2020-12-30 NOTE — Progress Notes (Signed)
SENT IN REQUEST FOR LINZESS TO PHARMACY TO BE FILLED AFTER PHONE MESSAGE LINZESS HELPS

## 2021-01-03 ENCOUNTER — Other Ambulatory Visit: Payer: Self-pay

## 2021-01-03 ENCOUNTER — Ambulatory Visit: Payer: Medicare Other | Admitting: Anesthesiology

## 2021-01-03 ENCOUNTER — Encounter
Admission: RE | Disposition: A | Discharge status: Home / Self Care | Payer: Self-pay | Source: Home / Self Care | Attending: Ophthalmology

## 2021-01-03 ENCOUNTER — Ambulatory Visit
Admission: RE | Admit: 2021-01-03 | Discharge: 2021-01-03 | Disposition: A | Discharge status: Home / Self Care | Payer: Medicare Other | Attending: Ophthalmology | Admitting: Ophthalmology

## 2021-01-03 ENCOUNTER — Encounter: Payer: Self-pay | Admitting: Ophthalmology

## 2021-01-03 DIAGNOSIS — Z888 Allergy status to other drugs, medicaments and biological substances status: Secondary | ICD-10-CM | POA: Insufficient documentation

## 2021-01-03 DIAGNOSIS — Z791 Long term (current) use of non-steroidal anti-inflammatories (NSAID): Secondary | ICD-10-CM | POA: Diagnosis not present

## 2021-01-03 DIAGNOSIS — Z881 Allergy status to other antibiotic agents status: Secondary | ICD-10-CM | POA: Insufficient documentation

## 2021-01-03 DIAGNOSIS — Z79899 Other long term (current) drug therapy: Secondary | ICD-10-CM | POA: Insufficient documentation

## 2021-01-03 DIAGNOSIS — Z96652 Presence of left artificial knee joint: Secondary | ICD-10-CM | POA: Insufficient documentation

## 2021-01-03 DIAGNOSIS — Z880 Allergy status to penicillin: Secondary | ICD-10-CM | POA: Diagnosis not present

## 2021-01-03 DIAGNOSIS — Z8616 Personal history of COVID-19: Secondary | ICD-10-CM | POA: Insufficient documentation

## 2021-01-03 DIAGNOSIS — H2512 Age-related nuclear cataract, left eye: Secondary | ICD-10-CM | POA: Diagnosis not present

## 2021-01-03 DIAGNOSIS — Z886 Allergy status to analgesic agent status: Secondary | ICD-10-CM | POA: Diagnosis not present

## 2021-01-03 DIAGNOSIS — Z87891 Personal history of nicotine dependence: Secondary | ICD-10-CM | POA: Diagnosis not present

## 2021-01-03 HISTORY — PX: CATARACT EXTRACTION W/PHACO: SHX586

## 2021-01-03 HISTORY — DX: Dorsopathy, unspecified: M53.9

## 2021-01-03 SURGERY — PHACOEMULSIFICATION, CATARACT, WITH IOL INSERTION
Anesthesia: Monitor Anesthesia Care | Site: Eye | Laterality: Left

## 2021-01-03 MED ORDER — MIDAZOLAM HCL 2 MG/2ML IJ SOLN
INTRAMUSCULAR | Status: DC | PRN
  Administered 2021-01-03 (×2): 1 mg via INTRAVENOUS

## 2021-01-03 MED ORDER — ACETAMINOPHEN 500 MG PO TABS: 1000.0000 mg | ORAL_TABLET | Freq: Once | ORAL | Status: DC | PRN

## 2021-01-03 MED ORDER — TETRACAINE HCL 0.5 % OP SOLN
1.0000 [drp] | OPHTHALMIC | Status: DC | PRN
  Administered 2021-01-03 (×2): 1 [drp] via OPHTHALMIC

## 2021-01-03 MED ORDER — FENTANYL CITRATE (PF) 100 MCG/2ML IJ SOLN
INTRAMUSCULAR | Status: DC | PRN
  Administered 2021-01-03 (×2): 50 ug via INTRAVENOUS

## 2021-01-03 MED ORDER — SIGHTPATH DOSE#1 BSS IO SOLN
INTRAOCULAR | Status: DC | PRN
  Administered 2021-01-03: 88 mL via OPHTHALMIC

## 2021-01-03 MED ORDER — ACETAMINOPHEN 160 MG/5ML PO SOLN: 975.0000 mg | Freq: Once | ORAL | Status: DC | PRN

## 2021-01-03 MED ORDER — ONDANSETRON HCL 4 MG/2ML IJ SOLN: 4.0000 mg | Freq: Once | INTRAMUSCULAR | Status: DC | PRN

## 2021-01-03 MED ORDER — MOXIFLOXACIN HCL 0.5 % OP SOLN
OPHTHALMIC | Status: DC | PRN
  Administered 2021-01-03: 0.2 mL via OPHTHALMIC

## 2021-01-03 MED ORDER — LIDOCAINE HCL (PF) 2 % IJ SOLN
INTRAOCULAR | Status: DC | PRN
  Administered 2021-01-03: 1 mL via INTRAOCULAR

## 2021-01-03 MED ORDER — SIGHTPATH DOSE#1 SODIUM HYALURONATE 10 MG/ML IO SOLUTION
PREFILLED_SYRINGE | INTRAOCULAR | Status: DC | PRN
  Administered 2021-01-03: 0.85 mL via INTRAOCULAR

## 2021-01-03 MED ORDER — ARMC OPHTHALMIC DILATING DROPS
1.0000 "application " | OPHTHALMIC | Status: DC | PRN
  Administered 2021-01-03 (×3): 1 via OPHTHALMIC

## 2021-01-03 MED ORDER — SIGHTPATH DOSE#1 SODIUM HYALURONATE 23 MG/ML IO SOLUTION
PREFILLED_SYRINGE | INTRAOCULAR | Status: DC | PRN
  Administered 2021-01-03: 0.6 mL via INTRAOCULAR

## 2021-01-03 MED ORDER — LACTATED RINGERS IV SOLN: INTRAVENOUS | Status: DC

## 2021-01-03 MED ORDER — SIGHTPATH DOSE#1 BSS IO SOLN
INTRAOCULAR | Status: DC | PRN
  Administered 2021-01-03: 15 mL

## 2021-01-03 SURGICAL SUPPLY — 16 items
CANNULA ANT/CHMB 27G (MISCELLANEOUS) IMPLANT
CANNULA ANT/CHMB 27GA (MISCELLANEOUS) IMPLANT
DISSECTOR HYDRO NUCLEUS 50X22 (MISCELLANEOUS) ×2 IMPLANT
GLOVE SURG GAMMEX PI TX LF 7.5 (GLOVE) ×2 IMPLANT
GLOVE SURG SYN 8.5  E (GLOVE) ×2
GLOVE SURG SYN 8.5 E (GLOVE) ×1 IMPLANT
GLOVE SURG SYN 8.5 PF PI (GLOVE) ×1 IMPLANT
GOWN STRL REUS W/ TWL LRG LVL3 (GOWN DISPOSABLE) ×2 IMPLANT
GOWN STRL REUS W/TWL LRG LVL3 (GOWN DISPOSABLE) ×4
LENS IOL TECNIS EYHANCE 22.5 (Intraocular Lens) ×1 IMPLANT
MARKER SKIN DUAL TIP RULER LAB (MISCELLANEOUS) IMPLANT
PACK EYE AFTER SURG (MISCELLANEOUS) ×2 IMPLANT
SYR 3ML LL SCALE MARK (SYRINGE) ×2 IMPLANT
SYR TB 1ML LUER SLIP (SYRINGE) ×2 IMPLANT
WATER STERILE IRR 250ML POUR (IV SOLUTION) ×2 IMPLANT
WIPE NON LINTING 3.25X3.25 (MISCELLANEOUS) ×2 IMPLANT

## 2021-01-03 NOTE — Anesthesia Preprocedure Evaluation (Signed)
Anesthesia Evaluation  Patient identified by MRN, date of birth, ID band Patient awake    Reviewed: Allergy & Precautions, H&P , NPO status , Patient's Chart, lab work & pertinent test results, reviewed documented beta blocker date and time   Airway Mallampati: II  TM Distance: >3 FB Neck ROM: full    Dental no notable dental hx.    Pulmonary neg pulmonary ROS, former smoker,    Pulmonary exam normal breath sounds clear to auscultation       Cardiovascular Exercise Tolerance: Good hypertension, negative cardio ROS   Rhythm:regular Rate:Normal     Neuro/Psych  Headaches, PSYCHIATRIC DISORDERS Depression    GI/Hepatic negative GI ROS, Neg liver ROS,   Endo/Other  negative endocrine ROS  Renal/GU negative Renal ROS  negative genitourinary   Musculoskeletal   Abdominal   Peds  Hematology negative hematology ROS (+)   Anesthesia Other Findings   Reproductive/Obstetrics negative OB ROS                             Anesthesia Physical Anesthesia Plan  ASA: 2  Anesthesia Plan: MAC   Post-op Pain Management:    Induction:   PONV Risk Score and Plan: 2 and TIVA and Treatment may vary due to age or medical condition  Airway Management Planned:   Additional Equipment:   Intra-op Plan:   Post-operative Plan:   Informed Consent: I have reviewed the patients History and Physical, chart, labs and discussed the procedure including the risks, benefits and alternatives for the proposed anesthesia with the patient or authorized representative who has indicated his/her understanding and acceptance.     Dental Advisory Given  Plan Discussed with: CRNA  Anesthesia Plan Comments:         Anesthesia Quick Evaluation

## 2021-01-03 NOTE — Op Note (Signed)
OPERATIVE NOTE  KELSI BENHAM [id number] 01/03/2021   PREOPERATIVE DIAGNOSIS:  Nuclear sclerotic cataract left eye.  H25.12   POSTOPERATIVE DIAGNOSIS:    Nuclear sclerotic cataract left eye.     PROCEDURE:  Phacoemusification with posterior chamber intraocular lens placement of the left eye   LENS:   Implant Name Type Inv. Item Serial No. Manufacturer Lot No. LRB No. Used Action  LENS IOL TECNIS EYHANCE 22.5 - [id number] Intraocular Lens LENS IOL TECNIS EYHANCE 22.5 [id number] JOHNSON   Left 1 Implanted      Procedure(s) with comments: CATARACT EXTRACTION PHACO AND INTRAOCULAR LENS PLACEMENT (IOC) LEFT (Left) - 7.32 0:46.3  DIB00 +22.5   ULTRASOUND TIME: 0 minutes 46 seconds.  CDE 7.32   SURGEON:  Willey Blade, MD, MPH   ANESTHESIA:  Topical with tetracaine drops augmented with 1% preservative-free intracameral lidocaine.  ESTIMATED BLOOD LOSS: <1 mL   COMPLICATIONS:  None.   DESCRIPTION OF PROCEDURE:  The patient was identified in the holding room and transported to the operating room and placed in the supine position under the operating microscope.  The left eye was identified as the operative eye and it was prepped and draped in the usual sterile ophthalmic fashion.   A 1.0 millimeter clear-corneal paracentesis was made at the 5:00 position. 0.5 ml of preservative-free 1% lidocaine with epinephrine was injected into the anterior chamber.  The anterior chamber was filled with Healon 5 viscoelastic.  A 2.4 millimeter keratome was used to make a near-clear corneal incision at the 2:00 position.  A curvilinear capsulorrhexis was made with a cystotome and capsulorrhexis forceps.  Balanced salt solution was used to hydrodissect and hydrodelineate the nucleus.   Phacoemulsification was then used in stop and chop fashion to remove the lens nucleus and epinucleus.  The remaining cortex was then removed using the irrigation and aspiration handpiece. Healon was then placed into the  capsular bag to distend it for lens placement.  A lens was then injected into the capsular bag.  The remaining viscoelastic was aspirated.   Wounds were hydrated with balanced salt solution.  The anterior chamber was inflated to a physiologic pressure with balanced salt solution.  Intracameral vigamox 0.1 mL undiltued was injected into the eye and a drop placed onto the ocular surface.  No wound leaks were noted.  The patient was taken to the recovery room in stable condition without complications of anesthesia or surgery  Willey Blade 01/03/2021, 12:20 PM

## 2021-01-03 NOTE — Transfer of Care (Signed)
Immediate Anesthesia Transfer of Care Note  Patient: Carol [name]  Procedure(s) Performed: CATARACT EXTRACTION PHACO AND INTRAOCULAR LENS PLACEMENT (IOC) LEFT (Left: Eye)  Patient Location: PACU  Anesthesia Type: MAC  Level of Consciousness: awake, alert  and patient cooperative  Airway and Oxygen Therapy: Patient Spontanous Breathing and Patient connected to supplemental oxygen  Post-op Assessment: Post-op Vital signs reviewed, Patient's Cardiovascular Status Stable, Respiratory Function Stable, Patent Airway and No signs of Nausea or vomiting  Post-op Vital Signs: Reviewed and stable  Complications: No notable events documented.

## 2021-01-03 NOTE — H&P (Signed)
[facility]   Primary Care Physician:  Myrene Buddy, NP Ophthalmologist: Dr. Willey Blade  Pre-Procedure History & Physical: HPI:  Carol [name] is a 75 y.o. female here for cataract surgery.   Past Medical History:  Diagnosis Date   Anxiety    Arthritis    COVID-19 11/2020   symptom free since 12/08/20   Depression    Headache    migraine - last one 2016   Hypertension    reason that she was given [place]gro[place] & she remarks that her BP has been better she has been taking it.    Multilevel degenerative disc disease    Positive skin test for tuberculosis    told that she is a carrier, (treated age 43 and as adult) Negative x-ray(per pt)   Wears contact lenses    Wears dentures    partial upper   Wears hearing aid in right ear     Past Surgical History:  Procedure Laterality Date   ABDOMINAL HYSTERECTOMY     ANKLE ARTHROSCOPY Left 10/30/2019   Procedure: A-SCOPE/DEBRIDEMENT, EXTENSIVE, LEFT ANKLE;  Surgeon: Gwyneth Revels, DPM;  Location: [facility] ORS;  Service: Podiatry;  Laterality: Left;   ANKLE RECONSTRUCTION Left 10/30/2019   Procedure: BROSTRUM-GOULD LEFT ANKLE;  Surgeon: Gwyneth Revels, DPM;  Location: [facility] ORS;  Service: Podiatry;  Laterality: Left;   BACK SURGERY     x3 previous cervical fusion & then removed hardware    BREAST SURGERY Right    benign-    HAND TENDON SURGERY Right 2016   JOINT REPLACEMENT Right    also had a partial on the L knee   LUMBAR FUSION     MYRINGOTOMY WITH TUBE PLACEMENT Right    TONSILLECTOMY     TUBAL LIGATION     had a 2nd. surgery for adhesions    Prior to Admission medications   Medication Sig Start Date End Date Taking? Authorizing Provider  alendronate (FOSAMAX) 70 MG tablet Take 70 mg by mouth once a week. 11/08/20  Yes [provider]  Calcium Carbonate-Vitamin D (CALTRATE 600+D PO) Take 1 tablet by mouth daily.    Yes [provider]  cetirizine (ZYRTEC) 10 MG tablet Take 10 mg by mouth daily.    Yes [provider]  chlorthalidone (HYGROTON) 50 MG tablet Take 25 mg by mouth daily.   Yes [provider]  clonazePAM (KLONOPIN) 0.5 MG tablet Take 0.5 mg by mouth daily as needed for anxiety.  08/14/19  Yes [provider]  cyclobenzaprine (FLEXERIL) 10 MG tablet Take by mouth. 04/29/20  Yes [provider]  diclofenac Sodium (VOLTAREN) 1 % GEL Apply topically as needed.   Yes [provider]  FLUoxetine (PROZAC) 40 MG capsule TAKE 1 CAPSULE(40 MG) BY MOUTH EVERY DAY 08/24/20  Yes [provider]  fluticasone (FLONASE) 50 MCG/ACT nasal spray Place 2 sprays into both nostrils daily as needed for allergies.    Yes [provider]  glucose 4 GM chewable tablet Chew 1 tablet by mouth as needed for low blood sugar.   Yes [provider]  KLOR-CON M20 20 MEQ tablet Take 20 mEq by mouth 2 (two) times daily. 07/23/20  Yes [provider]  linaclotide Karlene Einstein) 145 MCG CAPS capsule Take 1 capsule (145 mcg total) by mouth daily before breakfast. 12/30/20  Yes Vanga, Loel Dubonnet, MD  meloxicam (MOBIC) 15 MG tablet Take 15 mg by mouth daily. 01/05/19  Yes [provider]  potassium chloride (KLOR-CON)  10 MEQ tablet Take 10 mEq by mouth in the morning and at bedtime.  02/04/19 12/23/20 Yes [provider]  Psyllium-Calcium (FIBER PLUS CALCIUM PO) Take 1 tablet by mouth in the morning and at bedtime.   Yes [provider]  pyridOXINE (VITAMIN B-6) 100 MG tablet Take 100 mg by mouth daily.   Yes [provider]  rosuvastatin (CRESTOR) 5 MG tablet Take 5 mg by mouth daily. 11/19/17  Yes [provider]  SUMAtriptan (IMITREX) 100 MG tablet Take 1 tablet (100 mg total) by mouth daily as needed for migraine. May repeat in 2 hours if headache persists or recurs. 11/23/16  Yes Plonk, Chrissie Noa, MD  traZODone (DESYREL) 50 MG tablet Take 50 mg by mouth at bedtime as needed for sleep.  07/01/19 12/23/20 Yes  [provider]  Vitamin D, Cholecalciferol, 50 MCG (2000 UT) CAPS Take 2,000 Units by mouth daily.    Yes [provider]  clindamycin (CLEOCIN) 150 MG capsule Take 600 mg by mouth See admin instructions. Take 600 mg by mouth 1 hour prior to dental appointment 03/07/16   [provider]  meloxicam (MOBIC) 15 MG tablet Take 1 tablet by mouth daily. 12/22/20   [provider]  rizatriptan (MAXALT) 10 MG tablet Take 10 mg by mouth as needed for migraine.     [provider]    Allergies as of 11/11/2020 - Review Complete 07/06/2020  Allergen Reaction Noted   Gentamicin Rash, Itching, and Other (See Comments) 11/21/2012   Celecoxib Other (See Comments) 01/27/2020   Hydralazine Nausea And Vomiting 10/03/2013   Ibandronic acid Other (See Comments) 09/16/2013   Penicillins Hives and Other (See Comments) 09/16/2013    Family History  Problem Relation Age of Onset   Aneurysm Mother    Lung cancer Brother     Social History   Socioeconomic History   Marital status: Divorced    Spouse name: Not on file   Number of children: Not on file   Years of education: Not on file   Highest education level: Not on file  Occupational History   Not on file  Tobacco Use   Smoking status: Former    Types: Cigarettes    Quit date: 03/15/1976    Years since quitting: 44.8   Smokeless tobacco: Never  Vaping Use   Vaping Use: Never used  Substance and Sexual Activity   Alcohol use: No   Drug use: Yes    Frequency: 1.0 times per week    Types: Marijuana    Comment: occasionally    Sexual activity: Not on file  Other Topics Concern   Not on file  Social History Narrative   Not on file   Social Determinants of Health   Financial Resource Strain: Not on file  Food Insecurity: Not on file  Transportation Needs: Not on file  Physical Activity: Not on file  Stress: Not on file  Social Connections: Not on file  Intimate Partner Violence: Not on file     Review of Systems: See HPI, otherwise negative ROS  Physical Exam: BP 131/84   Pulse 84   Temp (!) 97.5 F (36.4 C) (Temporal)   Resp 18   Ht 5\' 3"  (1.6 m)   Wt 78.9 kg   SpO2 99%   BMI 30.82 kg/m  General:   Alert, cooperative in NAD Head:  Normocephalic and atraumatic. Respiratory:  Normal work of breathing. Cardiovascular:  RRR  Impression/Plan: ELIZEBATH WEVER is here for  cataract surgery.  Risks, benefits, limitations, and alternatives regarding cataract surgery have been reviewed with the patient.  Questions have been answered.  All parties agreeable.   Willey Blade, MD  01/03/2021, 11:53 AM

## 2021-01-03 NOTE — Anesthesia Postprocedure Evaluation (Signed)
Anesthesia Post Note  Patient: Carol [name]  Procedure(s) Performed: CATARACT EXTRACTION PHACO AND INTRAOCULAR LENS PLACEMENT (IOC) LEFT (Left: Eye)     Patient location during evaluation: PACU Anesthesia Type: MAC Level of consciousness: awake and alert Pain management: pain level controlled Vital Signs Assessment: post-procedure vital signs reviewed and stable Respiratory status: spontaneous breathing, nonlabored ventilation and respiratory function stable Cardiovascular status: stable and blood pressure returned to baseline Postop Assessment: no apparent nausea or vomiting Anesthetic complications: no   No notable events documented.  April Manson

## 2021-01-04 ENCOUNTER — Encounter: Payer: Self-pay | Admitting: Ophthalmology

## 2021-01-13 NOTE — Discharge Instructions (Signed)

## 2021-01-17 ENCOUNTER — Ambulatory Visit: Payer: Medicare Other | Admitting: Anesthesiology

## 2021-01-17 ENCOUNTER — Ambulatory Visit
Admission: RE | Admit: 2021-01-17 | Discharge: 2021-01-17 | Disposition: A | Discharge status: Home / Self Care | Payer: Medicare Other | Attending: Ophthalmology | Admitting: Ophthalmology

## 2021-01-17 ENCOUNTER — Encounter
Admission: RE | Disposition: A | Discharge status: Home / Self Care | Payer: Self-pay | Source: Home / Self Care | Attending: Ophthalmology

## 2021-01-17 ENCOUNTER — Encounter: Payer: Self-pay | Admitting: Ophthalmology

## 2021-01-17 ENCOUNTER — Other Ambulatory Visit: Payer: Self-pay

## 2021-01-17 DIAGNOSIS — Z881 Allergy status to other antibiotic agents status: Secondary | ICD-10-CM | POA: Insufficient documentation

## 2021-01-17 DIAGNOSIS — Z87891 Personal history of nicotine dependence: Secondary | ICD-10-CM | POA: Diagnosis not present

## 2021-01-17 DIAGNOSIS — Z79899 Other long term (current) drug therapy: Secondary | ICD-10-CM | POA: Insufficient documentation

## 2021-01-17 DIAGNOSIS — Z8616 Personal history of COVID-19: Secondary | ICD-10-CM | POA: Insufficient documentation

## 2021-01-17 DIAGNOSIS — Z888 Allergy status to other drugs, medicaments and biological substances status: Secondary | ICD-10-CM | POA: Insufficient documentation

## 2021-01-17 DIAGNOSIS — Z880 Allergy status to penicillin: Secondary | ICD-10-CM | POA: Diagnosis not present

## 2021-01-17 DIAGNOSIS — Z886 Allergy status to analgesic agent status: Secondary | ICD-10-CM | POA: Insufficient documentation

## 2021-01-17 DIAGNOSIS — Z791 Long term (current) use of non-steroidal anti-inflammatories (NSAID): Secondary | ICD-10-CM | POA: Insufficient documentation

## 2021-01-17 DIAGNOSIS — Z9842 Cataract extraction status, left eye: Secondary | ICD-10-CM | POA: Diagnosis not present

## 2021-01-17 DIAGNOSIS — H2511 Age-related nuclear cataract, right eye: Secondary | ICD-10-CM | POA: Insufficient documentation

## 2021-01-17 DIAGNOSIS — Z7983 Long term (current) use of bisphosphonates: Secondary | ICD-10-CM | POA: Insufficient documentation

## 2021-01-17 DIAGNOSIS — Z961 Presence of intraocular lens: Secondary | ICD-10-CM | POA: Diagnosis not present

## 2021-01-17 HISTORY — PX: CATARACT EXTRACTION W/PHACO: SHX586

## 2021-01-17 SURGERY — PHACOEMULSIFICATION, CATARACT, WITH IOL INSERTION
Anesthesia: Monitor Anesthesia Care | Site: Eye | Laterality: Right

## 2021-01-17 MED ORDER — LIDOCAINE HCL (PF) 2 % IJ SOLN
INTRAOCULAR | Status: DC | PRN
  Administered 2021-01-17: 1 mL via INTRAOCULAR

## 2021-01-17 MED ORDER — SIGHTPATH DOSE#1 BSS IO SOLN
INTRAOCULAR | Status: DC | PRN
  Administered 2021-01-17: 15 mL

## 2021-01-17 MED ORDER — ARMC OPHTHALMIC DILATING DROPS
1.0000 "application " | OPHTHALMIC | Status: DC | PRN
  Administered 2021-01-17 (×3): 1 via OPHTHALMIC

## 2021-01-17 MED ORDER — MIDAZOLAM HCL 2 MG/2ML IJ SOLN
INTRAMUSCULAR | Status: DC | PRN
Start: 2021-01-17 — End: 2021-01-17
  Administered 2021-01-17: 2 mg via INTRAVENOUS

## 2021-01-17 MED ORDER — FENTANYL CITRATE (PF) 100 MCG/2ML IJ SOLN
INTRAMUSCULAR | Status: DC | PRN
  Administered 2021-01-17: 100 ug via INTRAVENOUS

## 2021-01-17 MED ORDER — SIGHTPATH DOSE#1 BSS IO SOLN
INTRAOCULAR | Status: DC | PRN
  Administered 2021-01-17: 75 mL via OPHTHALMIC

## 2021-01-17 MED ORDER — ACETAMINOPHEN 325 MG PO TABS
325.0000 mg | ORAL_TABLET | Freq: Once | ORAL | Status: DC
Start: 2021-01-17 — End: 2021-01-17

## 2021-01-17 MED ORDER — SIGHTPATH DOSE#1 SODIUM HYALURONATE 23 MG/ML IO SOLUTION
PREFILLED_SYRINGE | INTRAOCULAR | Status: DC | PRN
  Administered 2021-01-17: 0.6 mL via INTRAOCULAR

## 2021-01-17 MED ORDER — TETRACAINE HCL 0.5 % OP SOLN
1.0000 [drp] | OPHTHALMIC | Status: DC | PRN
  Administered 2021-01-17 (×3): 1 [drp] via OPHTHALMIC

## 2021-01-17 MED ORDER — MOXIFLOXACIN HCL 0.5 % OP SOLN
OPHTHALMIC | Status: DC | PRN
  Administered 2021-01-17: 0.2 mL via OPHTHALMIC

## 2021-01-17 MED ORDER — SIGHTPATH DOSE#1 SODIUM HYALURONATE 10 MG/ML IO SOLUTION
PREFILLED_SYRINGE | INTRAOCULAR | Status: DC | PRN
  Administered 2021-01-17: 0.85 mL via INTRAOCULAR

## 2021-01-17 MED ORDER — LACTATED RINGERS IV SOLN: INTRAVENOUS | Status: DC

## 2021-01-17 MED ORDER — ACETAMINOPHEN 160 MG/5ML PO SOLN: 325.0000 mg | Freq: Once | ORAL | Status: DC

## 2021-01-17 SURGICAL SUPPLY — 16 items
CANNULA ANT/CHMB 27G (MISCELLANEOUS) IMPLANT
CANNULA ANT/CHMB 27GA (MISCELLANEOUS) IMPLANT
DISSECTOR HYDRO NUCLEUS 50X22 (MISCELLANEOUS) ×2 IMPLANT
GLOVE SURG GAMMEX PI TX LF 7.5 (GLOVE) ×2 IMPLANT
GLOVE SURG SYN 8.5  E (GLOVE) ×2
GLOVE SURG SYN 8.5 E (GLOVE) ×1 IMPLANT
GLOVE SURG SYN 8.5 PF PI (GLOVE) ×1 IMPLANT
GOWN STRL REUS W/ TWL LRG LVL3 (GOWN DISPOSABLE) ×2 IMPLANT
GOWN STRL REUS W/TWL LRG LVL3 (GOWN DISPOSABLE) ×4
LENS IOL TECNIS EYHANCE 22.0 (Intraocular Lens) ×1 IMPLANT
MARKER SKIN DUAL TIP RULER LAB (MISCELLANEOUS) IMPLANT
PACK EYE AFTER SURG (MISCELLANEOUS) ×2 IMPLANT
SYR 3ML LL SCALE MARK (SYRINGE) ×2 IMPLANT
SYR TB 1ML LUER SLIP (SYRINGE) ×2 IMPLANT
WATER STERILE IRR 250ML POUR (IV SOLUTION) ×2 IMPLANT
WIPE NON LINTING 3.25X3.25 (MISCELLANEOUS) ×2 IMPLANT

## 2021-01-17 NOTE — Anesthesia Preprocedure Evaluation (Signed)
Anesthesia Evaluation  Patient identified by MRN, date of birth, ID band Patient awake    Reviewed: Allergy & Precautions, H&P , NPO status , Patient's Chart, lab work & pertinent test results, reviewed documented beta blocker date and time   Airway Mallampati: II  TM Distance: >3 FB Neck ROM: full    Dental no notable dental hx.    Pulmonary neg pulmonary ROS, former smoker,    Pulmonary exam normal breath sounds clear to auscultation       Cardiovascular Exercise Tolerance: Good hypertension, negative cardio ROS Normal cardiovascular exam Rhythm:regular Rate:Normal     Neuro/Psych  Headaches, PSYCHIATRIC DISORDERS Depression    GI/Hepatic negative GI ROS, Neg liver ROS,   Endo/Other  negative endocrine ROS  Renal/GU negative Renal ROS  negative genitourinary   Musculoskeletal   Abdominal   Peds  Hematology negative hematology ROS (+)   Anesthesia Other Findings   Reproductive/Obstetrics negative OB ROS                             Anesthesia Physical  Anesthesia Plan  ASA: 2  Anesthesia Plan: MAC   Post-op Pain Management:    Induction:   PONV Risk Score and Plan: 2 and Treatment may vary due to age or medical condition, TIVA and Midazolam  Airway Management Planned:   Additional Equipment:   Intra-op Plan:   Post-operative Plan:   Informed Consent: I have reviewed the patients History and Physical, chart, labs and discussed the procedure including the risks, benefits and alternatives for the proposed anesthesia with the patient or authorized representative who has indicated his/her understanding and acceptance.     Dental Advisory Given  Plan Discussed with: CRNA  Anesthesia Plan Comments:         Anesthesia Quick Evaluation

## 2021-01-17 NOTE — H&P (Signed)
[facility]   Primary Care Physician:  Myrene Buddy, NP Ophthalmologist: Dr. Willey Blade  Pre-Procedure History & Physical: HPI:  Carol [name] is a 75 y.o. female here for cataract surgery.   Past Medical History:  Diagnosis Date   Anxiety    Arthritis    COVID-19 11/2020   symptom free since 12/08/20   Depression    Headache    migraine - last one 2016   Hypertension    reason that she was given [place] & she remarks that her BP has been better she has been taking it.    Multilevel degenerative disc disease    Positive skin test for tuberculosis    told that she is a carrier, (treated age 89 and as adult) Negative x-ray(per pt)   Wears contact lenses    Wears dentures    partial upper   Wears hearing aid in right ear     Past Surgical History:  Procedure Laterality Date   ABDOMINAL HYSTERECTOMY     ANKLE ARTHROSCOPY Left 10/30/2019   Procedure: A-SCOPE/DEBRIDEMENT, EXTENSIVE, LEFT ANKLE;  Surgeon: Gwyneth Revels, DPM;  Location: [facility];  Service: Podiatry;  Laterality: Left;   ANKLE RECONSTRUCTION Left 10/30/2019   Procedure: BROSTRUM-GOULD LEFT ANKLE;  Surgeon: Gwyneth Revels, DPM;  Location: [facility];  Service: Podiatry;  Laterality: Left;   BACK SURGERY     x3 previous cervical fusion & then removed hardware    BREAST SURGERY Right    benign-    CATARACT EXTRACTION W/PHACO Left 01/03/2021   Procedure: CATARACT EXTRACTION PHACO AND INTRAOCULAR LENS PLACEMENT (IOC) LEFT;  Surgeon: Nevada Crane, MD;  Location: [facility];  Service: Ophthalmology;  Laterality: Left;  7.32 0:46.3   HAND TENDON SURGERY Right 2016   JOINT REPLACEMENT Right    also had a partial on the L knee   LUMBAR FUSION     MYRINGOTOMY WITH TUBE PLACEMENT Right    TONSILLECTOMY     TUBAL LIGATION     had a 2nd. surgery for adhesions    Prior to Admission medications   Medication Sig Start Date End Date Taking? Authorizing Provider  alendronate (FOSAMAX) 70 MG  tablet Take 70 mg by mouth once a week. 11/08/20  Yes [provider]  Calcium Carbonate-Vitamin D (CALTRATE 600+D PO) Take 1 tablet by mouth daily.    Yes [provider]  cetirizine (ZYRTEC) 10 MG tablet Take 10 mg by mouth daily.   Yes [provider]  chlorthalidone (HYGROTON) 50 MG tablet Take 25 mg by mouth daily.   Yes [provider]  FLUoxetine (PROZAC) 40 MG capsule TAKE 1 CAPSULE(40 MG) BY MOUTH EVERY DAY 08/24/20  Yes [provider]  fluticasone (FLONASE) 50 MCG/ACT nasal spray Place 2 sprays into both nostrils daily as needed for allergies.    Yes [provider]  meloxicam (MOBIC) 15 MG tablet Take 15 mg by mouth daily. 01/05/19  Yes [provider]  pyridOXINE (VITAMIN B-6) 100 MG tablet Take 100 mg by mouth daily.   Yes [provider]  rizatriptan (MAXALT) 10 MG tablet Take 10 mg by mouth as needed for migraine.    Yes [provider]  SUMAtriptan (IMITREX) 100 MG tablet Take 1 tablet (100 mg total) by mouth daily as needed for migraine. May repeat in 2 hours if headache persists or recurs. 11/23/16  Yes Plonk, Chrissie Noa, MD  Vitamin D, Cholecalciferol, 50 MCG (2000 UT) CAPS Take 2,000 Units by  mouth daily.    Yes [provider]  clindamycin (CLEOCIN) 150 MG capsule Take 600 mg by mouth See admin instructions. Take 600 mg by mouth 1 hour prior to dental appointment Patient not taking: Reported on 01/17/2021 03/07/16   [provider]  clonazePAM (KLONOPIN) 0.5 MG tablet Take 0.5 mg by mouth daily as needed for anxiety.  08/14/19   [provider]  cyclobenzaprine (FLEXERIL) 10 MG tablet Take by mouth. 04/29/20   [provider]  diclofenac Sodium (VOLTAREN) 1 % GEL Apply topically as needed.    [provider]  glucose 4 GM chewable tablet Chew 1 tablet by mouth as needed for low blood sugar.    [provider]  KLOR-CON M20 20 MEQ tablet Take 20 mEq by  mouth 2 (two) times daily. 07/23/20   [provider]  linaclotide Karlene Einstein) 145 MCG CAPS capsule Take 1 capsule (145 mcg total) by mouth daily before breakfast. 12/30/20   Vanga, Loel Dubonnet, MD  meloxicam (MOBIC) 15 MG tablet Take 1 tablet by mouth daily. 12/22/20   [provider]  potassium chloride (KLOR-CON) 10 MEQ tablet Take 10 mEq by mouth in the morning and at bedtime.  02/04/19 12/23/20  [provider]  Psyllium-Calcium (FIBER PLUS CALCIUM PO) Take 1 tablet by mouth in the morning and at bedtime.    [provider]  rosuvastatin (CRESTOR) 5 MG tablet Take 5 mg by mouth daily. 11/19/17   [provider]  traZODone (DESYREL) 50 MG tablet Take 50 mg by mouth at bedtime as needed for sleep.  07/01/19 12/23/20  [provider]    Allergies as of 11/11/2020 - Review Complete 07/06/2020  Allergen Reaction Noted   Gentamicin Rash, Itching, and Other (See Comments) 11/21/2012   Celecoxib Other (See Comments) 01/27/2020   Hydralazine Nausea And Vomiting 10/03/2013   Ibandronic acid Other (See Comments) 09/16/2013   Penicillins Hives and Other (See Comments) 09/16/2013    Family History  Problem Relation Age of Onset   Aneurysm Mother    Lung cancer Brother     Social History   Socioeconomic History   Marital status: Divorced    Spouse name: Not on file   Number of children: Not on file   Years of education: Not on file   Highest education level: Not on file  Occupational History   Not on file  Tobacco Use   Smoking status: Former    Types: Cigarettes    Quit date: 03/15/1976    Years since quitting: 44.8   Smokeless tobacco: Never  Vaping Use   Vaping Use: Never used  Substance and Sexual Activity   Alcohol use: No   Drug use: Yes    Frequency: 1.0 times per week    Types: Marijuana    Comment: occasionally    Sexual activity: Not on file  Other Topics Concern   Not on file  Social History Narrative   Not on file    Social Determinants of Health   Financial Resource Strain: Not on file  Food Insecurity: Not on file  Transportation Needs: Not on file  Physical Activity: Not on file  Stress: Not on file  Social Connections: Not on file  Intimate Partner Violence: Not on file    Review of Systems: See HPI, otherwise negative ROS  Physical Exam: BP (!) 143/65   Pulse 92   Temp (!) 97.3 F (36.3 C) (Temporal)   Resp 18   Ht 5\' 3"  (  1.6 m)   Wt 79.2 kg   SpO2 99%   BMI 30.93 kg/m  General:   Alert, cooperative in NAD Head:  Normocephalic and atraumatic. Respiratory:  Normal work of breathing. Cardiovascular:  RRR  Impression/Plan: Carol [name] is here for cataract surgery.  Risks, benefits, limitations, and alternatives regarding cataract surgery have been reviewed with the patient.  Questions have been answered.  All parties agreeable.   Willey Blade, MD  01/17/2021, 11:00 AM

## 2021-01-17 NOTE — Anesthesia Postprocedure Evaluation (Signed)
Anesthesia Post Note  Patient: Carol [name]  Procedure(s) Performed: CATARACT EXTRACTION PHACO AND INTRAOCULAR LENS PLACEMENT (IOC) RIGHT (Right: Eye)     Patient location during evaluation: PACU Anesthesia Type: MAC Level of consciousness: awake and alert and oriented Pain management: satisfactory to patient Vital Signs Assessment: post-procedure vital signs reviewed and stable Respiratory status: spontaneous breathing, nonlabored ventilation and respiratory function stable Cardiovascular status: blood pressure returned to baseline and stable Postop Assessment: Adequate PO intake and No signs of nausea or vomiting Anesthetic complications: no   No notable events documented.  Raliegh Ip

## 2021-01-17 NOTE — Op Note (Signed)
OPERATIVE NOTE  ANEL CREIGHTON [id number] 01/17/2021   PREOPERATIVE DIAGNOSIS:  Nuclear sclerotic cataract right eye.  H25.11   POSTOPERATIVE DIAGNOSIS:    Nuclear sclerotic cataract right eye.     PROCEDURE:  Phacoemusification with posterior chamber intraocular lens placement of the right eye   LENS:   Implant Name Type Inv. Item Serial No. Manufacturer Lot No. LRB No. Used Action  LENS IOL TECNIS EYHANCE 22.0 - [id number] Intraocular Lens LENS IOL TECNIS EYHANCE 22.0 [id number] JOHNSON   Right 1 Implanted       Procedure(s) with comments: CATARACT EXTRACTION PHACO AND INTRAOCULAR LENS PLACEMENT (IOC) RIGHT (Right) - 5.09 0:32.9  DIB00 +22.0   SURGEON:  Willey Blade, MD, MPH  ANESTHESIOLOGIST: Anesthesiologist: Ranee Gosselin, MD CRNA: Michaele Offer, CRNA   ANESTHESIA:  Topical with tetracaine drops augmented with 1% preservative-free intracameral lidocaine.  ESTIMATED BLOOD LOSS: less than 1 mL.   COMPLICATIONS:  None.   DESCRIPTION OF PROCEDURE:  The patient was identified in the holding room and transported to the operating room and placed in the supine position under the operating microscope.  The right eye was identified as the operative eye and it was prepped and draped in the usual sterile ophthalmic fashion.   A 1.0 millimeter clear-corneal paracentesis was made at the 10:30 position. 0.5 ml of preservative-free 1% lidocaine with epinephrine was injected into the anterior chamber.  The anterior chamber was filled with Healon 5 viscoelastic.  A 2.4 millimeter keratome was used to make a near-clear corneal incision at the 8:00 position.  A curvilinear capsulorrhexis was made with a cystotome and capsulorrhexis forceps.  Balanced salt solution was used to hydrodissect and hydrodelineate the nucleus.   Phacoemulsification was then used in stop and chop fashion to remove the lens nucleus and epinucleus.  The remaining cortex was then removed using the irrigation and  aspiration handpiece. Healon was then placed into the capsular bag to distend it for lens placement.  A lens was then injected into the capsular bag.  The remaining viscoelastic was aspirated.   Wounds were hydrated with balanced salt solution.  The anterior chamber was inflated to a physiologic pressure with balanced salt solution.   Intracameral vigamox 0.1 mL undiluted was injected into the eye and a drop placed onto the ocular surface.  No wound leaks were noted.  The patient was taken to the recovery room in stable condition without complications of anesthesia or surgery  Willey Blade 01/17/2021, 11:28 AM

## 2021-01-17 NOTE — Transfer of Care (Signed)
Immediate Anesthesia Transfer of Care Note  Patient: Carol [name]  Procedure(s) Performed: CATARACT EXTRACTION PHACO AND INTRAOCULAR LENS PLACEMENT (IOC) RIGHT (Right: Eye)  Patient Location: PACU  Anesthesia Type: MAC  Level of Consciousness: awake, alert  and patient cooperative  Airway and Oxygen Therapy: Patient Spontanous Breathing and Patient connected to supplemental oxygen  Post-op Assessment: Post-op Vital signs reviewed, Patient's Cardiovascular Status Stable, Respiratory Function Stable, Patent Airway and No signs of Nausea or vomiting  Post-op Vital Signs: Reviewed and stable  Complications: No notable events documented.

## 2021-02-28 ENCOUNTER — Other Ambulatory Visit: Payer: Self-pay

## 2021-02-28 ENCOUNTER — Encounter: Payer: Self-pay | Admitting: Cardiovascular Disease

## 2021-02-28 ENCOUNTER — Ambulatory Visit: Payer: Medicare Other | Admitting: Cardiovascular Disease

## 2021-02-28 VITALS — BP 130/70 | HR 86 | Ht 63.0 in | Wt 180.5 lb

## 2021-02-28 DIAGNOSIS — E782 Mixed hyperlipidemia: Secondary | ICD-10-CM | POA: Diagnosis not present

## 2021-02-28 DIAGNOSIS — I10 Essential (primary) hypertension: Secondary | ICD-10-CM | POA: Diagnosis not present

## 2021-02-28 DIAGNOSIS — I872 Venous insufficiency (chronic) (peripheral): Secondary | ICD-10-CM | POA: Diagnosis not present

## 2021-02-28 NOTE — Patient Instructions (Addendum)
Medication Instructions:  No changes  If you need a refill on your cardiac medications before your next appointment, please call your pharmacy.   Lab work: No new labs needed  Testing/Procedures: No new testing needed  Follow-Up: At [facility], you and your health needs are our priority.  As part of our continuing mission to provide you with exceptional heart care, we have created designated Provider Care Teams.  These Care Teams include your primary Cardiologist (physician) and Advanced Practice Providers (APPs -  Physician Assistants and Nurse Practitioners) who all work together to provide you with the care you need, when you need it.  You will need a follow up appointment as needed  Providers on your designated Care Team:   Christopher Berge, NP Ryan Dunn, PA-C Cadence Furth, PA-C  COVID-19 Vaccine Information can be found at: [email or web address] For questions related to vaccine distribution or appointments, please email [email or web address] or call [phone].    

## 2021-02-28 NOTE — Progress Notes (Signed)
Cardiology Office Note  Date:  02/28/2021   ID:  Carol [name], DOB [birth year], MRN [id number]  PCP:  Myrene Buddy, NP   Chief Complaint  Patient presents with   New Patient (Initial Visit)    Self referral for family history of CAD & stroke. Patient c/o shortness of breath, tremors, racing heart beats, breaking out into a cold sweat that has lasted for about 5-6 weeks ago Medications reviewed by the patient verbally.     HPI:  [name] is a 75 year old woman with past medical history of  Smoking Mild COPD Hypertension Presented by self-referral to discuss family history of coronary disease/stroke, also with symptoms of shortness of breath, breaking out in cold sweats, tremors, fast heartbeat  Active at baseline, denies any chest pain or shortness of breath on exertion Stopped smoking 6 to 7 years ago  Currently taking low-dose cholesterol medication  CT scan chest abdomen pelvis reviewed Images pulled up and discussed with her No significant coronary calcification Very minimal aortic atherosclerosis noted, pointed out to her  Feels she is having episodes of anxiety causing tremor, cold sweats  Concerned given strong family history Mother with brain aneurysm died in her 67s, Brother had other cardiac issues  EKG personally reviewed by myself on todays visit Normal sinus rhythm rate 86 bpm no significant ST-T wave changes  PMH:   has a past medical history of Anxiety, Arthritis, COVID-19 (11/2020), Depression, Headache, Hypertension, Multilevel degenerative disc disease, Positive skin test for tuberculosis, Wears contact lenses, Wears dentures, and Wears hearing aid in right ear.  PSH:    Past Surgical History:  Procedure Laterality Date   ABDOMINAL HYSTERECTOMY     ANKLE ARTHROSCOPY Left 10/30/2019   Procedure: A-SCOPE/DEBRIDEMENT, EXTENSIVE, LEFT ANKLE;  Surgeon: Gwyneth Revels, DPM;  Location: [facility];  Service: Podiatry;  Laterality: Left;   ANKLE  RECONSTRUCTION Left 10/30/2019   Procedure: BROSTRUM-GOULD LEFT ANKLE;  Surgeon: Gwyneth Revels, DPM;  Location: [facility];  Service: Podiatry;  Laterality: Left;   BACK SURGERY     x3 previous cervical fusion & then removed hardware    BREAST SURGERY Right    benign-    CATARACT EXTRACTION W/PHACO Left 01/03/2021   Procedure: CATARACT EXTRACTION PHACO AND INTRAOCULAR LENS PLACEMENT (IOC) LEFT;  Surgeon: Nevada Crane, MD;  Location: [facility];  Service: Ophthalmology;  Laterality: Left;  7.32 0:46.3   CATARACT EXTRACTION W/PHACO Right 01/17/2021   Procedure: CATARACT EXTRACTION PHACO AND INTRAOCULAR LENS PLACEMENT (IOC) RIGHT;  Surgeon: Nevada Crane, MD;  Location: [facility];  Service: Ophthalmology;  Laterality: Right;  5.09 0:32.9   HAND TENDON SURGERY Right 2016   JOINT REPLACEMENT Right    also had a partial on the L knee   LUMBAR FUSION     MYRINGOTOMY WITH TUBE PLACEMENT Right    TONSILLECTOMY     TUBAL LIGATION     had a 2nd. surgery for adhesions    Current Outpatient Medications  Medication Sig Dispense Refill   alendronate (FOSAMAX) 70 MG tablet Take 70 mg by mouth once a week.     Calcium Carbonate-Vitamin D (CALTRATE 600+D PO) Take 1 tablet by mouth daily.      cetirizine (ZYRTEC) 10 MG tablet Take 10 mg by mouth daily.     chlorthalidone (HYGROTON) 50 MG tablet Take 25 mg by mouth daily.     clonazePAM (KLONOPIN) 0.5 MG tablet Take 0.5 mg by mouth daily as needed for anxiety.  cyclobenzaprine (FLEXERIL) 10 MG tablet Take by mouth.     diclofenac Sodium (VOLTAREN) 1 % GEL Apply topically as needed.     FLUoxetine (PROZAC) 40 MG capsule TAKE 1 CAPSULE(40 MG) BY MOUTH EVERY DAY     fluticasone (FLONASE) 50 MCG/ACT nasal spray Place 2 sprays into both nostrils daily as needed for allergies.      glucose 4 GM chewable tablet Chew 1 tablet by mouth as needed for low blood sugar.     KLOR-CON M20 20 MEQ tablet Take 20 mEq by mouth 2 (two)  times daily.     meloxicam (MOBIC) 15 MG tablet Take 15 mg by mouth daily.     Psyllium-Calcium (FIBER PLUS CALCIUM PO) Take 1 tablet by mouth in the morning and at bedtime.     pyridOXINE (VITAMIN B-6) 100 MG tablet Take 100 mg by mouth daily.     rizatriptan (MAXALT) 10 MG tablet Take 10 mg by mouth as needed for migraine.      rosuvastatin (CRESTOR) 5 MG tablet Take 5 mg by mouth daily.  1   SUMAtriptan (IMITREX) 100 MG tablet Take 1 tablet (100 mg total) by mouth daily as needed for migraine. May repeat in 2 hours if headache persists or recurs. 10 tablet 2   traZODone (DESYREL) 50 MG tablet Take 50 mg by mouth at bedtime as needed for sleep.      Vitamin D, Cholecalciferol, 50 MCG (2000 UT) CAPS Take 2,000 Units by mouth daily.      clindamycin (CLEOCIN) 150 MG capsule Take 600 mg by mouth See admin instructions. Take 600 mg by mouth 1 hour prior to dental appointment (Patient not taking: Reported on 01/17/2021)     linaclotide (LINZESS) 145 MCG CAPS capsule Take 1 capsule (145 mcg total) by mouth daily before breakfast. (Patient not taking: Reported on 02/28/2021) 30 capsule 2   meloxicam (MOBIC) 15 MG tablet Take 1 tablet by mouth daily. (Patient not taking: Reported on 02/28/2021)     potassium chloride (KLOR-CON) 10 MEQ tablet Take 10 mEq by mouth in the morning and at bedtime.  (Patient not taking: Reported on 02/28/2021)     No current facility-administered medications for this visit.     Allergies:   Gentamicin, Celecoxib, Hydralazine, Ibandronic acid, Oxycodone hcl, and Penicillins   Social History:  The patient  reports that she quit smoking about 44 years ago. Her smoking use included cigarettes. She has never used smokeless tobacco. She reports current drug use. Frequency: 1.00 time per week. Drug: Marijuana. She reports that she does not drink alcohol.   Family History:   family history includes Aneurysm in her mother; Heart disease in her mother; Hyperlipidemia in her brother  and mother; Hypertension in her brother and mother; Lung cancer in her brother.    Review of Systems: Review of Systems  Constitutional: Negative.   HENT: Negative.    Respiratory: Negative.    Cardiovascular: Negative.   Gastrointestinal: Negative.   Musculoskeletal: Negative.   Neurological:  Positive for tremors.  Psychiatric/Behavioral:  The patient is nervous/anxious.   All other systems reviewed and are negative.   PHYSICAL EXAM: VS:  BP 130/70 (BP Location: Left Arm, Patient Position: Sitting, Cuff Size: Normal)   Pulse 86   Ht 5\' 3"  (1.6 m)   Wt 180 lb 8 oz (81.9 kg)   SpO2 99%   BMI 31.97 kg/m  , BMI Body mass index is 31.97 kg/m. GEN: Well nourished, well developed, in no  acute distress HEENT: normal Neck: no JVD, carotid bruits, or masses Cardiac: RRR; no murmurs, rubs, or gallops,no edema  Respiratory:  clear to auscultation bilaterally, normal work of breathing GI: soft, nontender, nondistended, + BS MS: no deformity or atrophy Skin: warm and dry, no rash Neuro:  Strength and sensation are intact Psych: euthymic mood, full affect  Recent Labs: 07/06/2020: BUN 15; Creatinine, Ser 0.99; Hemoglobin 14.0; Magnesium 1.8; Platelets 257; Potassium 2.4; Sodium 135    Lipid Panel Lab Results  Component Value Date   CHOL 178 07/19/2016   HDL 73 07/19/2016   LDLCALC 68 07/19/2016   TRIG 186 (H) 07/19/2016      Wt Readings from Last 3 Encounters:  02/28/21 180 lb 8 oz (81.9 kg)  01/17/21 174 lb 9.6 oz (79.2 kg)  01/03/21 174 lb (78.9 kg)     ASSESSMENT AND PLAN:  Problem List Items Addressed This Visit       Cardiology Problems   Hypertension - Primary   Hyperlipidemia   Chronic venous insufficiency   Family history coronary disease Current non-smoker, cholesterol reasonable, no diabetes CT scan images reviewed in detail, no significant coronary calcification, there is minimal aortic atherosclerosis -No further testing  needed  Tremor/anxiety Recommended stress reduction techniques Also recommended checking heart rate and blood pressure when she has episodes to ensure she is not having cardiac arrhythmia For any tachycardia/inappropriate heart rate, recommended she call our office -Recommended deep breathing, walking program -Poor sleep hygiene, goes to sleep at 3 or 4 in the morning, gets 5 to 6 hours of sleep.  Recommend we need to work on better sleep  Hyperlipidemia Recommend she could continue low-dose Crestor  Essential hypertension Blood pressure is well controlled on today's visit. No changes made to the medications.     Total encounter time more than 60 minutes  Greater than 50% was spent in counseling and coordination of care with the patient    Signed, Dossie Arbour, M.D., Ph.D. [facility] [place], [place] [phone]

## 2021-04-01 ENCOUNTER — Ambulatory Visit: Payer: Medicare Other | Admitting: Nurse Practitioner

## 2021-04-19 ENCOUNTER — Other Ambulatory Visit: Payer: Self-pay

## 2021-04-19 ENCOUNTER — Ambulatory Visit
Payer: Medicare Other | Attending: Student in an Organized Health Care Education/Training Program | Admitting: Student in an Organized Health Care Education/Training Program

## 2021-04-19 ENCOUNTER — Encounter: Payer: Self-pay | Admitting: Student in an Organized Health Care Education/Training Program

## 2021-04-19 VITALS — HR 109 | Temp 97.3°F | Resp 16 | Ht 63.0 in | Wt 171.0 lb

## 2021-04-19 DIAGNOSIS — G894 Chronic pain syndrome: Secondary | ICD-10-CM | POA: Diagnosis not present

## 2021-04-19 DIAGNOSIS — G8929 Other chronic pain: Secondary | ICD-10-CM | POA: Insufficient documentation

## 2021-04-19 DIAGNOSIS — M5416 Radiculopathy, lumbar region: Secondary | ICD-10-CM | POA: Insufficient documentation

## 2021-04-19 MED ORDER — TRAMADOL HCL 50 MG PO TABS: 50.0000 mg | ORAL_TABLET | Freq: Three times a day (TID) | ORAL | 0 refills | Status: AC | PRN

## 2021-04-19 NOTE — Patient Instructions (Signed)
Pain Management Discharge Instructions  General Discharge Instructions :  If you need to reach your doctor call: Monday-Friday 8:00 am - 4:00 pm at [phone] or toll free [phone].  After clinic hours [phone] to have operator reach doctor.  Bring all of your medication bottles to all your appointments in the pain clinic.  To cancel or reschedule your appointment with Pain Management please remember to call 24 hours in advance to avoid a fee.  Refer to the educational materials which you have been given on: General Risks, I had my Procedure. Discharge Instructions, Post Sedation.  Post Procedure Instructions:  The drugs you were given will stay in your system until tomorrow, so for the next 24 hours you should not drive, make any legal decisions or drink any alcoholic beverages.  You may eat anything you prefer, but it is better to start with liquids then soups and crackers, and gradually work up to solid foods.  Please notify your doctor immediately if you have any unusual bleeding, trouble breathing or pain that is not related to your normal pain.  Depending on the type of procedure that was done, some parts of your body may feel week and/or numb.  This usually clears up by tonight or the next day.  Walk with the use of an assistive device or accompanied by an adult for the 24 hours.  You may use ice on the affected area for the first 24 hours.  Put ice in a Ziploc bag and cover with a towel and place against area 15 minutes on 15 minutes off.  You may switch to heat after 24 hours.GENERAL RISKS AND COMPLICATIONS  What are the risk, side effects and possible complications? Generally speaking, most procedures are safe.  However, with any procedure there are risks, side effects, and the possibility of complications.  The risks and complications are dependent upon the sites that are lesioned, or the type of nerve block to be performed.  The closer the procedure is to the spine,  the more serious the risks are.  Great care is taken when placing the radio frequency needles, block needles or lesioning probes, but sometimes complications can occur. Infection: Any time there is an injection through the skin, there is a risk of infection.  This is why sterile conditions are used for these blocks.  There are four possible types of infection. Localized skin infection. Central Nervous System Infection-This can be in the form of Meningitis, which can be deadly. Epidural Infections-This can be in the form of an epidural abscess, which can cause pressure inside of the spine, causing compression of the spinal cord with subsequent paralysis. This would require an emergency surgery to decompress, and there are no guarantees that the patient would recover from the paralysis. Discitis-This is an infection of the intervertebral discs.  It occurs in about 1% of discography procedures.  It is difficult to treat and it may lead to surgery.        2. Pain: the needles have to go through skin and soft tissues, will cause soreness.       3. Damage to internal structures:  The nerves to be lesioned may be near blood vessels or    other nerves which can be potentially damaged.       4. Bleeding: Bleeding is more common if the patient is taking blood thinners such as  aspirin, Coumadin, Ticiid, Plavix, etc., or if he/she have some genetic predisposition  such as hemophilia. Bleeding into the spinal   canal can cause compression of the spinal  cord with subsequent paralysis.  This would require an emergency surgery to  decompress and there are no guarantees that the patient would recover from the  paralysis.       5. Pneumothorax:  Puncturing of a lung is a possibility, every time a needle is introduced in  the area of the chest or upper back.  Pneumothorax refers to free air around the  collapsed lung(s), inside of the thoracic cavity (chest cavity).  Another two possible  complications related to a similar  event would include: Hemothorax and Chylothorax.   These are variations of the Pneumothorax, where instead of air around the collapsed  lung(s), you may have blood or chyle, respectively.       6. Spinal headaches: They may occur with any procedures in the area of the spine.       7. Persistent CSF (Cerebro-Spinal Fluid) leakage: This is a rare problem, but may occur  with prolonged intrathecal or epidural catheters either due to the formation of a fistulous  track or a dural tear.       8. Nerve damage: By working so close to the spinal cord, there is always a possibility of  nerve damage, which could be as serious as a permanent spinal cord injury with  paralysis.       9. Death:  Although rare, severe deadly allergic reactions known as "Anaphylactic  reaction" can occur to any of the medications used.      10. Worsening of the symptoms:  We can always make thing worse.  What are the chances of something like this happening? Chances of any of this occuring are extremely low.  By statistics, you have more of a chance of getting killed in a motor vehicle accident: while driving to the hospital than any of the above occurring .  Nevertheless, you should be aware that they are possibilities.  In general, it is similar to taking a shower.  Everybody knows that you can slip, hit your head and get killed.  Does that mean that you should not shower again?  Nevertheless always keep in mind that statistics do not mean anything if you happen to be on the wrong side of them.  Even if a procedure has a 1 (one) in a 1,000,000 (million) chance of going wrong, it you happen to be that one..Also, keep in mind that by statistics, you have more of a chance of having something go wrong when taking medications.  Who should not have this procedure? If you are on a blood thinning medication (e.g. Coumadin, Plavix, see list of "Blood Thinners"), or if you have an active infection going on, you should not have the procedure.   If you are taking any blood thinners, please inform your physician.  How should I prepare for this procedure? Do not eat or drink anything at least six hours prior to the procedure. Bring a driver with you .  It cannot be a taxi. Come accompanied by an adult that can drive you back, and that is strong enough to help you if your legs get weak or numb from the local anesthetic. Take all of your medicines the morning of the procedure with just enough water to swallow them. If you have diabetes, make sure that you are scheduled to have your procedure done first thing in the morning, whenever possible. If you have diabetes, take only half of your insulin dose and notify our nurse   that you have done so as soon as you arrive at the clinic. If you are diabetic, but only take blood sugar pills (oral hypoglycemic), then do not take them on the morning of your procedure.  You may take them after you have had the procedure. Do not take aspirin or any aspirin-containing medications, at least eleven (11) days prior to the procedure.  They may prolong bleeding. Wear loose fitting clothing that may be easy to take off and that you would not mind if it got stained with Betadine or blood. Do not wear any jewelry or perfume Remove any nail coloring.  It will interfere with some of our monitoring equipment.  NOTE: Remember that this is not meant to be interpreted as a complete list of all possible complications.  Unforeseen problems may occur.  BLOOD THINNERS The following drugs contain aspirin or other products, which can cause increased bleeding during surgery and should not be taken for 2 weeks prior to and 1 week after surgery.  If you should need take something for relief of minor pain, you may take acetaminophen which is found in Tylenol,m Datril, Anacin-3 and Panadol. It is not blood thinner. The products listed below are.  Do not take any of the products listed below in addition to any listed on your  instruction sheet.  A.P.C or A.P.C with Codeine Codeine Phosphate Capsules #3 Ibuprofen Ridaura  ABC compound Congesprin Imuran rimadil  Advil Cope Indocin Robaxisal  Alka-Seltzer Effervescent Pain Reliever and Antacid Coricidin or Coricidin-D  Indomethacin Rufen  Alka-Seltzer plus Cold Medicine Cosprin Ketoprofen S-A-C Tablets  Anacin Analgesic Tablets or Capsules Coumadin Korlgesic Salflex  Anacin Extra Strength Analgesic tablets or capsules CP-2 Tablets Lanoril Salicylate  Anaprox Cuprimine Capsules Levenox Salocol  Anexsia-D Dalteparin Magan Salsalate  Anodynos Darvon compound Magnesium Salicylate Sine-off  Ansaid Dasin Capsules Magsal Sodium Salicylate  Anturane Depen Capsules Marnal Soma  APF Arthritis pain formula Dewitt's Pills Measurin Stanback  Argesic Dia-Gesic Meclofenamic Sulfinpyrazone  Arthritis Bayer Timed Release Aspirin Diclofenac Meclomen Sulindac  Arthritis pain formula Anacin Dicumarol Medipren Supac  Analgesic (Safety coated) Arthralgen Diffunasal Mefanamic Suprofen  Arthritis Strength Bufferin Dihydrocodeine Mepro Compound Suprol  Arthropan liquid Dopirydamole Methcarbomol with Aspirin Synalgos  ASA tablets/Enseals Disalcid Micrainin Tagament  Ascriptin Doan's Midol Talwin  Ascriptin A/D Dolene Mobidin Tanderil  Ascriptin Extra Strength Dolobid Moblgesic Ticlid  Ascriptin with Codeine Doloprin or Doloprin with Codeine Momentum Tolectin  Asperbuf Duoprin Mono-gesic Trendar  Aspergum Duradyne Motrin or Motrin IB Triminicin  Aspirin plain, buffered or enteric coated Durasal Myochrisine Trigesic  Aspirin Suppositories Easprin Nalfon Trillsate  Aspirin with Codeine Ecotrin Regular or Extra Strength Naprosyn Uracel  Atromid-S Efficin Naproxen Ursinus  Auranofin Capsules Elmiron Neocylate Vanquish  Axotal Emagrin Norgesic Verin  Azathioprine Empirin or Empirin with Codeine Normiflo Vitamin E  Azolid Emprazil Nuprin Voltaren  Bayer Aspirin plain, buffered or  children's or timed BC Tablets or powders Encaprin Orgaran Warfarin Sodium  Buff-a-Comp Enoxaparin Orudis Zorpin  Buff-a-Comp with Codeine Equegesic Os-Cal-Gesic   Buffaprin Excedrin plain, buffered or Extra Strength Oxalid   Bufferin Arthritis Strength Feldene Oxphenbutazone   Bufferin plain or Extra Strength Feldene Capsules Oxycodone with Aspirin   Bufferin with Codeine Fenoprofen Fenoprofen Pabalate or Pabalate-SF   Buffets II Flogesic Panagesic   Buffinol plain or Extra Strength Florinal or Florinal with Codeine Panwarfarin   Buf-Tabs Flurbiprofen Penicillamine   Butalbital Compound Four-way cold tablets Penicillin   Butazolidin Fragmin Pepto-Bismol   Carbenicillin Geminisyn Percodan   Carna Arthritis Reliever   Geopen Persantine   Carprofen Gold's salt Persistin   Chloramphenicol Goody's Phenylbutazone   Chloromycetin Haltrain Piroxlcam   Clmetidine heparin Plaquenil   Cllnoril Hyco-pap Ponstel   Clofibrate Hydroxy chloroquine Propoxyphen         Before stopping any of these medications, be sure to consult the physician who ordered them.  Some, such as Coumadin (Warfarin) are ordered to prevent or treat serious conditions such as "deep thrombosis", "pumonary embolisms", and other heart problems.  The amount of time that you may need off of the medication may also vary with the medication and the reason for which you were taking it.  If you are taking any of these medications, please make sure you notify your pain physician before you undergo any procedures.         Epidural Steroid Injection Patient Information  Description: The epidural space surrounds the nerves as they exit the spinal cord.  In some patients, the nerves can be compressed and inflamed by a bulging disc or a tight spinal canal (spinal stenosis).  By injecting steroids into the epidural space, we can bring irritated nerves into direct contact with a potentially helpful medication.  These steroids act directly on  the irritated nerves and can reduce swelling and inflammation which often leads to decreased pain.  Epidural steroids may be injected anywhere along the spine and from the neck to the low back depending upon the location of your pain.   After numbing the skin with local anesthetic (like Novocaine), a small needle is passed into the epidural space slowly.  You may experience a sensation of pressure while this is being done.  The entire block usually last less than 10 minutes.  Conditions which may be treated by epidural steroids:  Low back and leg pain Neck and arm pain Spinal stenosis Post-laminectomy syndrome Herpes zoster (shingles) pain Pain from compression fractures  Preparation for the injection:  Do not eat any solid food or dairy products within 8 hours of your appointment.  You may drink clear liquids up to 3 hours before appointment.  Clear liquids include water, black coffee, juice or soda.  No milk or cream please. You may take your regular medication, including pain medications, with a sip of water before your appointment  Diabetics should hold regular insulin (if taken separately) and take 1/2 normal NPH dos the morning of the procedure.  Carry some sugar containing items with you to your appointment. A driver must accompany you and be prepared to drive you home after your procedure.  Bring all your current medications with your. An IV may be inserted and sedation may be given at the discretion of the physician.   A blood pressure cuff, EKG and other monitors will often be applied during the procedure.  Some patients may need to have extra oxygen administered for a short period. You will be asked to provide medical information, including your allergies, prior to the procedure.  We must know immediately if you are taking blood thinners (like Coumadin/Warfarin)  Or if you are allergic to IV iodine contrast (dye). We must know if you could possible be pregnant.  Possible  side-effects: Bleeding from needle site Infection (rare, may require surgery) Nerve injury (rare) Numbness & tingling (temporary) Difficulty urinating (rare, temporary) Spinal headache ( a headache worse with upright posture) Light -headedness (temporary) Pain at injection site (several days) Decreased blood pressure (temporary) Weakness in arm/leg (temporary) Pressure sensation in back/neck (temporary)  Call if you experience: Fever/chills associated   with headache or increased back/neck pain. Headache worsened by an upright position. New onset weakness or numbness of an extremity below the injection site Hives or difficulty breathing (go to the emergency room) Inflammation or drainage at the infection site Severe back/neck pain Any new symptoms which are concerning to you  Please note:  Although the local anesthetic injected can often make your back or neck feel good for several hours after the injection, the pain will likely return.  It takes 3-7 days for steroids to work in the epidural space.  You may not notice any pain relief for at least that one week.  If effective, we will often do a series of three injections spaced 3-6 weeks apart to maximally decrease your pain.  After the initial series, we generally will wait several months before considering a repeat injection of the same type.  If you have any questions, please call [phone] [facility] 

## 2021-04-19 NOTE — Progress Notes (Signed)
Safety precautions to be maintained throughout the outpatient stay will include: orient to surroundings, keep bed in low position, maintain call bell within reach at all times, provide assistance with transfer out of bed and ambulation.  

## 2021-04-19 NOTE — Progress Notes (Signed)
PROVIDER NOTE: Information contained herein reflects review and annotations entered in association with encounter. Interpretation of such information and data should be left to medically-trained personnel. Information provided to patient can be located elsewhere in the medical record under "Patient Instructions". Document created using STT-dictation technology, any transcriptional errors that may result from process are unintentional.    Patient: Carol [name]  Service Category: E/M  Provider: Gillis Santa, MD  DOB: 03/26/1945  DOS: 04/19/2021  Specialty: Interventional Pain Management  MRN: [id number]  Setting: Ambulatory outpatient  PCP: Carol Lange, NP  Type: Established Patient    Referring Provider: Sallee [name], *  Location: Office  Delivery: Face-to-face     HPI  Reason for encounter: Carol [name], a 76 y.o. year old female, is here today for evaluation and management of her Lumbar radiculopathy [M54.16]. Carol [name] primary complain today is Back Pain (lower) Last encounter: Practice (09/02/2019). My last encounter with her was on 03/11/20 Pertinent problems: Carol [name] has Degenerative spondylolisthesis; Fibromyalgia; OA (osteoarthritis); Status post left partial knee replacement; Osteopenia of spine; History of lumbar fusion; Hx of fusion of cervical spine; Obesity (BMI 30.0-34.9); Leg pain, left; Lumbar radiculopathy; and Chronic pain syndrome on their pertinent problem list. Pain Assessment: Severity of Chronic pain is reported as a 9 /10. Location: Back Lower/buttocks bilateral. Onset: More than a month ago. Quality: Aching, Burning, Stabbing, Tender, Discomfort. Timing: Constant. Modifying factor(s): tylenol, heat, rest, back brace. Vitals:  height is _0  (1.6 m) and weight is 171 lb (77.6 kg). Her temperature is 97.3 F (36.3 C) (abnormal). Her pulse is 109 (abnormal). Her respiration is 16 and oxygen saturation is 100%.    Patient has a history of L2-L5  lumbar fusion and is having lumbar radicular pain at this time.  She is status post a caudal epidural steroid injection 02/16/2020 that provided greater than 80% pain relief for about 10 months.  She states that she started to notice increased pain towards the end of Thanksgiving which is gotten worse over the last 2 months.  We discussed repeating caudal injection.  She is also requesting a small quantity of tramadol to help manage her breakthrough pain until she is able to get an epidural injection.  This is reasonable.   ROS  Constitutional: Denies any fever or chills Gastrointestinal: No reported hemesis, hematochezia, vomiting, or acute GI distress Musculoskeletal:  Low back pain with radiation to bilateral lower extremity Neurological: No reported episodes of acute onset apraxia, aphasia, dysarthria, agnosia, amnesia, paralysis, loss of coordination, or loss of consciousness  Medication Review  Calcium Carbonate-Vitamin D, FLUoxetine, Psyllium-Calcium, SUMAtriptan, alendronate, cetirizine, chlorthalidone, clonazePAM, cyclobenzaprine, diclofenac Sodium, fluticasone, glucose, meloxicam, potassium chloride SA, pyridOXINE, rizatriptan, rosuvastatin, traMADol, traZODone, and vitamin D3  History Review  Allergy: Carol [name] is allergic to gentamicin, celecoxib, hydralazine, ibandronic acid, oxycodone hcl, and penicillins. Drug: Carol [name]  reports current drug use. Frequency: 1.00 time per week. Drug: Marijuana. Alcohol:  reports no history of alcohol use. Tobacco:  reports that she quit smoking about 45 years ago. Her smoking use included cigarettes. She has never used smokeless tobacco. Social: Carol [name]  reports that she quit smoking about 45 years ago. Her smoking use included cigarettes. She has never used smokeless tobacco. She reports current drug use. Frequency: 1.00 time per week. Drug: Marijuana. She reports that she does not drink alcohol. Medical:  has a past medical history of Anxiety,  Arthritis, COVID-19 (11/2020), Depression, Headache, Hypertension, Multilevel degenerative disc disease, Positive  skin test for tuberculosis, Wears contact lenses, Wears dentures, and Wears hearing aid in right ear. Surgical: Carol [name]  has a past surgical history that includes Abdominal hysterectomy; Tonsillectomy; Breast surgery (Right); Tubal ligation; Myringotomy with tube placement (Right); Hand tendon surgery (Right, 2016); Lumbar fusion; Joint replacement (Right); Back surgery; Ankle reconstruction (Left, 10/30/2019); Ankle arthroscopy (Left, 10/30/2019); Cataract extraction w/PHACO (Left, 01/03/2021); and Cataract extraction w/PHACO (Right, 01/17/2021). Family: family history includes Aneurysm in her mother; Heart disease in her mother; Hyperlipidemia in her brother and mother; Hypertension in her brother and mother; Lung cancer in her brother.  Laboratory Chemistry Profile   Renal Lab Results  Component Value Date   BUN 15 07/06/2020   CREATININE 0.99 07/06/2020   BCR 18 07/19/2016   GFRAA >60 09/24/2019   GFRNONAA >60 07/06/2020     Hepatic Lab Results  Component Value Date   AST 30 09/24/2019   ALT 34 09/24/2019   ALBUMIN 4.3 09/24/2019   ALKPHOS 57 09/24/2019   LIPASE 47 07/06/2020     Electrolytes Lab Results  Component Value Date   NA 135 07/06/2020   K 2.4 (LL) 07/06/2020   CL 98 07/06/2020   CALCIUM 9.7 07/06/2020   MG 1.8 07/06/2020     Bone Lab Results  Component Value Date   VD25OH 56.6 04/26/2017     Inflammation (CRP: Acute Phase) (ESR: Chronic Phase) No results found for: CRP, ESRSEDRATE, LATICACIDVEN     Note: Above Lab results reviewed.  Recent Imaging Review  DG Chest 2 View CLINICAL DATA:  Substernal chest pain with shortness of breath.  EXAM: CHEST - 2 VIEW  COMPARISON:  03/25/2016  FINDINGS: The lungs are clear without focal pneumonia, edema, pneumothorax or pleural effusion. Interstitial markings are diffusely coarsened with chronic  features. The cardiopericardial silhouette is within normal limits for size. The visualized bony structures of the thorax show no acute abnormality. Telemetry leads overlie the chest.  IMPRESSION: No active cardiopulmonary disease.  Electronically Signed   By: Misty Stanley M.D.   On: 07/06/2020 17:08 Note: Reviewed        Physical Exam  General appearance: Well nourished, well developed, and well hydrated. In no apparent acute distress Mental status: Alert, oriented x 3 (person, place, & time)       Respiratory: No evidence of acute respiratory distress Eyes: PERLA Vitals: Pulse (!) 109    Temp (!) 97.3 F (36.3 C)    Resp 16    Ht _0  (1.6 m)    Wt 171 lb (77.6 kg)    SpO2 100%    BMI 30.29 kg/m  BMI: Estimated body mass index is 30.29 kg/m as calculated from the following:   Height as of this encounter: _1  (1.6 m).   Weight as of this encounter: 171 lb (77.6 kg). Ideal: Ideal body weight: 52.4 kg (115 lb 8.3 oz) Adjusted ideal body weight: 62.5 kg (137 lb 11.4 oz)  Lumbar Spine Area Exam  Skin & Axial Inspection: Well healed scar from previous spine surgery detected Alignment: Symmetrical Functional ROM: Pain restricted ROM       Stability: No instability detected Muscle Tone/Strength: Functionally intact. No obvious neuro-muscular anomalies detected. Sensory (Neurological): Dermatomal pain pattern  Gait & Posture Assessment  Ambulation: Limited Gait: Antalgic Posture: Difficulty standing up straight, due to pain    Lower Extremity Exam    Side: Right lower extremity  Side: Left lower extremity  Stability: No instability observed  Stability: No instability observed          Skin & Extremity Inspection: Skin color, temperature, and hair growth are WNL. No peripheral edema or cyanosis. No masses, redness, swelling, asymmetry, or associated skin lesions. No contractures.  Skin & Extremity Inspection: Skin color, temperature, and hair growth are WNL. No  peripheral edema or cyanosis. No masses, redness, swelling, asymmetry, or associated skin lesions. No contractures.  Functional ROM: Pain restricted ROM for all joints of the lower extremity          Functional ROM: Pain restricted ROM for all joints of the lower extremity          Muscle Tone/Strength: Functionally intact. No obvious neuro-muscular anomalies detected.  Muscle Tone/Strength: Functionally intact. No obvious neuro-muscular anomalies detected.  Sensory (Neurological): Neurogenic pain pattern        Sensory (Neurological): Neurogenic pain pattern        DTR: Patellar: deferred today Achilles: deferred today Plantar: deferred today  DTR: Patellar: deferred today Achilles: deferred today Plantar: deferred today  Palpation: No palpable anomalies  Palpation: No palpable anomalies    Assessment   Status Diagnosis  Having a Flare-up Having a Flare-up Having a Flare-up 1. Lumbar radiculopathy   2. Chronic radicular lumbar pain   3. Chronic pain syndrome      Updated Problems: No problems updated.   Plan of Care  Ms. KAMERIN AXFORD has a current medication list which includes the following long-term medication(s): calcium carbonate-vitamin d, chlorthalidone, fluoxetine, fluticasone, glucose, sumatriptan, and trazodone.  Orders:  Orders Placed This Encounter  Procedures   Caudal Epidural Injection    Standing Status:   Future    Standing Expiration Date:   05/17/2021    Scheduling Instructions:     Laterality: Midline     Level(s): Sacrococcygeal canal (Tailbone area)     Sedation: without     Scheduling Timeframe: As soon as pre-approved    Order Specific Question:   Where will this procedure be performed?    Answer:   [facility]   Requested Prescriptions   Signed Prescriptions Disp Refills   traMADol (ULTRAM) 50 MG tablet 21 tablet 0    Sig: Take 1 tablet (50 mg total) by mouth every 8 (eight) hours as needed for up to 7 days for severe pain.  PMP  checked and reviewed.   Follow-up plan:   Return in about 6 days (around 04/25/2021) for caudal ESI.     Status post caudal ESI on 09/01/2019: Helped significantly, repeat as needed.  Interlaminar window seem appropriate for spinal cord stimulator trial    Recent Visits No visits were found meeting these conditions. Showing recent visits within past 90 days and meeting all other requirements Today's Visits Date Type Provider Dept  04/19/21 Office Visit Gillis Santa, MD [facility]  Showing today's visits and meeting all other requirements Future Appointments No visits were found meeting these conditions. Showing future appointments within next 90 days and meeting all other requirements  I discussed the assessment and treatment plan with the patient. The patient was provided an opportunity to ask questions and all were answered. The patient agreed with the plan and demonstrated an understanding of the instructions.  Patient advised to call back or seek an in-person evaluation if the symptoms or condition worsens.  Duration of encounter:30 minutes.  Note by: Gillis Santa, MD Date: 04/19/2021; Time: 3:14 PM

## 2021-04-25 ENCOUNTER — Ambulatory Visit: Payer: Medicare Other | Admitting: Student in an Organized Health Care Education/Training Program

## 2021-05-04 ENCOUNTER — Other Ambulatory Visit: Payer: Self-pay

## 2021-05-04 ENCOUNTER — Ambulatory Visit (HOSPITAL_BASED_OUTPATIENT_CLINIC_OR_DEPARTMENT_OTHER): Payer: Medicare Other | Admitting: Student in an Organized Health Care Education/Training Program

## 2021-05-04 ENCOUNTER — Encounter: Payer: Self-pay | Admitting: Student in an Organized Health Care Education/Training Program

## 2021-05-04 ENCOUNTER — Ambulatory Visit
Admission: RE | Admit: 2021-05-04 | Discharge: 2021-05-04 | Disposition: A | Discharge status: Home / Self Care | Payer: Medicare Other | Source: Ambulatory Visit | Attending: Student in an Organized Health Care Education/Training Program | Admitting: Student in an Organized Health Care Education/Training Program

## 2021-05-04 VITALS — BP 128/73 | HR 84 | Temp 97.0°F | Resp 19 | Ht 63.0 in | Wt 182.0 lb

## 2021-05-04 DIAGNOSIS — G894 Chronic pain syndrome: Secondary | ICD-10-CM

## 2021-05-04 DIAGNOSIS — G8929 Other chronic pain: Secondary | ICD-10-CM

## 2021-05-04 DIAGNOSIS — M5416 Radiculopathy, lumbar region: Secondary | ICD-10-CM | POA: Insufficient documentation

## 2021-05-04 MED ORDER — LIDOCAINE HCL 2 % IJ SOLN
20.0000 mL | Freq: Once | INTRAMUSCULAR | Status: AC
  Administered 2021-05-04: 400 mg

## 2021-05-04 MED ORDER — LIDOCAINE HCL 2 % IJ SOLN
INTRAMUSCULAR | Status: AC
  Filled 2021-05-04: qty 20

## 2021-05-04 MED ORDER — DEXAMETHASONE SODIUM PHOSPHATE 10 MG/ML IJ SOLN
10.0000 mg | Freq: Once | INTRAMUSCULAR | Status: AC
  Administered 2021-05-04: 10 mg

## 2021-05-04 MED ORDER — SODIUM CHLORIDE 0.9% FLUSH
2.0000 mL | Freq: Once | INTRAVENOUS | Status: AC
  Administered 2021-05-04: 2 mL

## 2021-05-04 MED ORDER — IOHEXOL 180 MG/ML  SOLN
10.0000 mL | Freq: Once | INTRAMUSCULAR | Status: AC
  Administered 2021-05-04: 5 mL via EPIDURAL

## 2021-05-04 MED ORDER — ROPIVACAINE HCL 2 MG/ML IJ SOLN
2.0000 mL | Freq: Once | INTRAMUSCULAR | Status: AC
  Administered 2021-05-04: 2 mL via EPIDURAL

## 2021-05-04 MED ORDER — DEXAMETHASONE SODIUM PHOSPHATE 10 MG/ML IJ SOLN
INTRAMUSCULAR | Status: AC
  Filled 2021-05-04: qty 1

## 2021-05-04 MED ORDER — SODIUM CHLORIDE (PF) 0.9 % IJ SOLN
INTRAMUSCULAR | Status: AC
  Filled 2021-05-04: qty 10

## 2021-05-04 MED ORDER — ROPIVACAINE HCL 2 MG/ML IJ SOLN
INTRAMUSCULAR | Status: AC
  Filled 2021-05-04: qty 20

## 2021-05-04 NOTE — Patient Instructions (Signed)

## 2021-05-04 NOTE — Progress Notes (Signed)
Safety precautions to be maintained throughout the outpatient stay will include: orient to surroundings, keep bed in low position, maintain call bell within reach at all times, provide assistance with transfer out of bed and ambulation.  

## 2021-05-04 NOTE — Progress Notes (Signed)
PROVIDER NOTE: Information contained herein reflects review and annotations entered in association with encounter. Interpretation of such information and data should be left to medically-trained personnel. Information provided to patient can be located elsewhere in the medical record under "Patient Instructions". Document created using STT-dictation technology, any transcriptional errors that may result from process are unintentional.    Patient: Carol [name]  Service Category: Procedure  Provider: Edward Jolly, MD  DOB: 18-Feb-1946  DOS: 05/04/2021  Location: [facility]  MRN: [id number]  Setting: Ambulatory - outpatient  Referring Provider: Myrene Buddy, *  Type: Established Patient  Specialty: Interventional Pain Management  PCP: Myrene Buddy, NP   Primary Reason for Visit: Interventional Pain Management Treatment. CC: Back Pain  Procedure:          Anesthesia, Analgesia, Anxiolysis:  Type: Diagnostic Epidural Steroid Injection #2 (#1 09/01/19)  Region: Caudal Level: Sacrococcygeal   Laterality: Midline       Type: Local Anesthesia  Local Anesthetic: Lidocaine 1-2%  Position: Prone   Indications: 1. Lumbar radiculopathy   2. Chronic radicular lumbar pain   3. Chronic pain syndrome    Pain Score: Pre-procedure: 6 /10 Post-procedure: 2 /10   Pre-op Assessment:  Carol [name] is a 76 y.o. (year old), female patient, seen today for interventional treatment. She  has a past surgical history that includes Abdominal hysterectomy; Tonsillectomy; Breast surgery (Right); Tubal ligation; Myringotomy with tube placement (Right); Hand tendon surgery (Right, 2016); Lumbar fusion; Joint replacement (Right); Back surgery; Ankle reconstruction (Left, 10/30/2019); Ankle arthroscopy (Left, 10/30/2019); Cataract extraction w/PHACO (Left, 01/03/2021); and Cataract extraction w/PHACO (Right, 01/17/2021). Ms. Kaldenberg has a current medication list which includes the following  prescription(s): alendronate, calcium carbonate-vitamin d, cetirizine, chlorthalidone, clonazepam, cyclobenzaprine, diclofenac sodium, fluoxetine, fluticasone, glucose, klor-con m20, meloxicam, psyllium-calcium, pyridoxine, rizatriptan, rosuvastatin, sumatriptan, tramadol, venlafaxine xr, vitamin d3, fluticasone, meloxicam, meloxicam, and trazodone. Her primarily concern today is the Back Pain  Initial Vital Signs:  Pulse/HCG Rate: 84ECG Heart Rate: 76 Temp: (!) 97 F (36.1 C) Resp: 16 BP: 111/83 SpO2: 98 %  BMI: Estimated body mass index is 32.24 kg/m as calculated from the following:   Height as of this encounter: 5\' 3"  (1.6 m).   Weight as of this encounter: 182 lb (82.6 kg).  Risk Assessment: Allergies: Reviewed. She is allergic to gentamicin, celecoxib, hydralazine, ibandronic acid, oxycodone hcl, and penicillins.  Allergy Precautions: None required Coagulopathies: Reviewed. None identified.  Blood-thinner therapy: None at this time Active Infection(s): Reviewed. None identified. Carol [name] is afebrile  Site Confirmation: Carol [name] was asked to confirm the procedure and laterality before marking the site Procedure checklist: Completed Consent: Before the procedure and under the influence of no sedative(s), amnesic(s), or anxiolytics, the patient was informed of the treatment options, risks and possible complications. To fulfill our ethical and legal obligations, as recommended by the American Medical Association's Code of Ethics, I have informed the patient of my clinical impression; the nature and purpose of the treatment or procedure; the risks, benefits, and possible complications of the intervention; the alternatives, including doing nothing; the risk(s) and benefit(s) of the alternative treatment(s) or procedure(s); and the risk(s) and benefit(s) of doing nothing. The patient was provided information about the general risks and possible complications associated with the procedure.  These may include, but are not limited to: failure to achieve desired goals, infection, bleeding, organ or nerve damage, allergic reactions, paralysis, and death. In addition, the patient was informed of those risks and complications associated  to Spine-related procedures, such as failure to decrease pain; infection (i.e.: Meningitis, epidural or intraspinal abscess); bleeding (i.e.: epidural hematoma, subarachnoid hemorrhage, or any other type of intraspinal or peri-dural bleeding); organ or nerve damage (i.e.: Any type of peripheral nerve, nerve root, or spinal cord injury) with subsequent damage to sensory, motor, and/or autonomic systems, resulting in permanent pain, numbness, and/or weakness of one or several areas of the body; allergic reactions; (i.e.: anaphylactic reaction); and/or death. Furthermore, the patient was informed of those risks and complications associated with the medications. These include, but are not limited to: allergic reactions (i.e.: anaphylactic or anaphylactoid reaction(s)); adrenal axis suppression; blood sugar elevation that in diabetics may result in ketoacidosis or comma; water retention that in patients with history of congestive heart failure may result in shortness of breath, pulmonary edema, and decompensation with resultant heart failure; weight gain; swelling or edema; medication-induced neural toxicity; particulate matter embolism and blood vessel occlusion with resultant organ, and/or nervous system infarction; and/or aseptic necrosis of one or more joints. Finally, the patient was informed that Medicine is not an exact science; therefore, there is also the possibility of unforeseen or unpredictable risks and/or possible complications that may result in a catastrophic outcome. The patient indicated having understood very clearly. We have given the patient no guarantees and we have made no promises. Enough time was given to the patient to ask questions, all of which were  answered to the patient's satisfaction. Carol [name] has indicated that she wanted to continue with the procedure. Attestation: I, the ordering provider, attest that I have discussed with the patient the benefits, risks, side-effects, alternatives, likelihood of achieving goals, and potential problems during recovery for the procedure that I have provided informed consent. Date   Time: 05/04/2021  1:02 PM  Pre-Procedure Preparation:  Monitoring: As per clinic protocol. Respiration, ETCO2, SpO2, BP, heart rate and rhythm monitor placed and checked for adequate function Safety Precautions: Patient was assessed for positional comfort and pressure points before starting the procedure. Time-out: I initiated and conducted the "Time-out" before starting the procedure, as per protocol. The patient was asked to participate by confirming the accuracy of the "Time Out" information. Verification of the correct person, site, and procedure were performed and confirmed by me, the nursing staff, and the patient. "Time-out" conducted as per Joint Commission's Universal Protocol (UP.01.01.01). Time: 1326  Description of Procedure:          Target Area: Caudal Epidural Canal. Approach: Midline approach. Area Prepped: Entire Posterior Sacrococcygeal Region DuraPrep (Iodine Povacrylex [0.7% available iodine] and Isopropyl Alcohol, 74% w/w) Safety Precautions: Aspiration looking for blood return was conducted prior to all injections. At no point did we inject any substances, as a needle was being advanced. No attempts were made at seeking any paresthesias. Safe injection practices and needle disposal techniques used. Medications properly checked for expiration dates. SDV (single dose vial) medications used. Description of the Procedure: Protocol guidelines were followed. The patient was placed in position over the fluoroscopy table. The target area was identified and the area prepped in the usual manner. Skin & deeper  tissues infiltrated with local anesthetic. Appropriate amount of time allowed to pass for local anesthetics to take effect. The procedure needles were then advanced to the target area. Proper needle placement secured. Negative aspiration confirmed. Solution injected in intermittent fashion, asking for systemic symptoms every 0.5cc of injectate. The needles were then removed and the area cleansed, making sure to leave some of the prepping solution back to  take advantage of its long term bactericidal properties. Vitals:   05/04/21 1319 05/04/21 1322 05/04/21 1328 05/04/21 1332  BP: 140/83 132/87 136/80 128/73  Pulse:      Resp: (!) 24 15 13 19   Temp:      SpO2: 98% 98% 97% 98%  Weight:      Height:        Start Time: 1326 hrs. End Time: 1330 hrs. Materials:  Needle(s) Type: Epidural needle Gauge: 22G Length: 3.5-in Medication(s): Please see orders for medications and dosing details. 6 cc solution made of 3 cc of preservative-free saline, 2 cc of 0.2% ropivacaine, 1 cc of Decadron 10 mg/cc.  Imaging Guidance (Spinal):          Type of Imaging Technique: Fluoroscopy Guidance (Spinal) Indication(s): Assistance in needle guidance and placement for procedures requiring needle placement in or near specific anatomical locations not easily accessible without such assistance. Exposure Time: Please see nurses notes. Contrast: Before injecting any contrast, we confirmed that the patient did not have an allergy to iodine, shellfish, or radiological contrast. Once satisfactory needle placement was completed at the desired level, radiological contrast was injected. Contrast injected under live fluoroscopy. No contrast complications. See chart for type and volume of contrast used. Fluoroscopic Guidance: I was personally present during the use of fluoroscopy. "Tunnel Vision Technique" used to obtain the best possible view of the target area. Parallax error corrected before commencing the procedure.  "Direction-depth-direction" technique used to introduce the needle under continuous pulsed fluoroscopy. Once target was reached, antero-posterior, oblique, and lateral fluoroscopic projection used confirm needle placement in all planes. Images permanently stored in EMR. Interpretation: I personally interpreted the imaging intraoperatively. Adequate needle placement confirmed in multiple planes. Appropriate spread of contrast into desired area was observed. No evidence of afferent or efferent intravascular uptake. No intrathecal or subarachnoid spread observed. Permanent images saved into the patient's record.   Post-operative Assessment:  Post-procedure Vital Signs:  Pulse/HCG Rate: 8475 Temp: (!) 97 F (36.1 C) Resp: 19 BP: 128/73 SpO2: 98 %  EBL: None  Complications: No immediate post-treatment complications observed by team, or reported by patient.  Note: The patient tolerated the entire procedure well. A repeat set of vitals were taken after the procedure and the patient was kept under observation following institutional policy, for this type of procedure. Post-procedural neurological assessment was performed, showing return to baseline, prior to discharge. The patient was provided with post-procedure discharge instructions, including a section on how to identify potential problems. Should any problems arise concerning this procedure, the patient was given instructions to immediately contact , at any time, without hesitation. In any case, we plan to contact the patient by telephone for a follow-up status report regarding this interventional procedure.  Comments:  No additional relevant information.  5 out of 5 strength bilateral lower extremity: Plantar flexion, dorsiflexion, knee flexion, knee extension.  Plan of Care  Orders:  Orders Placed This Encounter  Procedures   DG PAIN CLINIC C-ARM 1-60 MIN NO REPORT    Intraoperative interpretation by procedural physician at [facility].    Standing Status:   Standing    Number of Occurrences:   1    Order Specific Question:   Reason for exam:    Answer:   Assistance in needle guidance and placement for procedures requiring needle placement in or near specific anatomical locations not easily accessible without such assistance.    Medications ordered for procedure: Meds ordered this encounter  Medications  iohexol (OMNIPAQUE) 180 MG/ML injection 10 mL    Must be Myelogram-compatible. If not available, you may substitute with a water-soluble, non-ionic, hypoallergenic, myelogram-compatible radiological contrast medium.   lidocaine (XYLOCAINE) 2 % (with pres) injection 400 mg   ropivacaine (PF) 2 mg/mL (0.2%) (NAROPIN) injection 2 mL   sodium chloride flush (NS) 0.9 % injection 2 mL   dexamethasone (DECADRON) injection 10 mg   Medications administered: We administered iohexol, lidocaine, ropivacaine (PF) 2 mg/mL (0.2%), sodium chloride flush, and dexamethasone.  See the medical record for exact dosing, route, and time of administration.  Follow-up plan:   Return in about 4 weeks (around 06/01/2021) for PPE VV.     Status post caudal ESI on 09/01/2019.  Interlaminar window seem appropriate for spinal cord stimulator trial   Recent Visits Date Type Provider Dept  04/19/21 Office Visit Edward JollyLateef, Rosea Dory, MD [facility]  Showing recent visits within past 90 days and meeting all other requirements Today's Visits Date Type Provider Dept  05/04/21 Procedure visit Edward JollyLateef, Crissie Aloi, MD [facility]  Showing today's visits and meeting all other requirements Future Appointments Date Type Provider Dept  06/01/21 Appointment Edward JollyLateef, Antonio Woodhams, MD [facility]  Showing future appointments within next 90 days and meeting all other requirements  Disposition: Discharge home  Discharge (Date   Time): 05/04/2021; 1336 hrs.   Primary Care Physician: Myrene BuddyGauger, Sarah Kathryn, NP [facility] Note by: Edward JollyBilal Patrese Neal, MD Date: 05/04/2021; Time: 1:42 PM  Disclaimer:  Medicine is not an exact science. The only guarantee in medicine is that nothing is guaranteed. It is important to note that the decision to proceed with this intervention was based on the information collected from the patient. The Data and conclusions were drawn from the patient's questionnaire, the interview, and the physical examination. Because the information was provided in large part by the patient, it cannot be guaranteed that it has not been purposely or unconsciously manipulated. Every effort has been made to obtain as much relevant data as possible for this evaluation. It is important to note that the conclusions that lead to this procedure are derived in large part from the available data. Always take into account that the treatment will also be dependent on availability of resources and existing treatment guidelines, considered by other Pain Management Practitioners as being common knowledge and practice, at the time of the intervention. For Medico-Legal purposes, it is also important to point out that variation in procedural techniques and pharmacological choices are the acceptable norm. The indications, contraindications, technique, and results of the above procedure should only be interpreted and judged by a Board-Certified Interventional Pain Specialist with extensive familiarity and expertise in the same exact procedure and technique.

## 2021-05-05 ENCOUNTER — Telehealth: Payer: Self-pay | Admitting: *Deleted

## 2021-05-05 NOTE — Telephone Encounter (Signed)
Post procedure call;  voicemail left to call if there are any questions or concerns.  °

## 2021-06-01 ENCOUNTER — Encounter: Payer: Self-pay | Admitting: Student in an Organized Health Care Education/Training Program

## 2021-06-01 ENCOUNTER — Ambulatory Visit
Payer: Medicare Other | Attending: Student in an Organized Health Care Education/Training Program | Admitting: Student in an Organized Health Care Education/Training Program

## 2021-06-01 ENCOUNTER — Other Ambulatory Visit: Payer: Self-pay

## 2021-06-01 DIAGNOSIS — M461 Sacroiliitis, not elsewhere classified: Secondary | ICD-10-CM

## 2021-06-01 DIAGNOSIS — M47818 Spondylosis without myelopathy or radiculopathy, sacral and sacrococcygeal region: Secondary | ICD-10-CM | POA: Diagnosis not present

## 2021-06-01 DIAGNOSIS — M47816 Spondylosis without myelopathy or radiculopathy, lumbar region: Secondary | ICD-10-CM

## 2021-06-01 DIAGNOSIS — M5416 Radiculopathy, lumbar region: Secondary | ICD-10-CM | POA: Diagnosis not present

## 2021-06-01 DIAGNOSIS — G8929 Other chronic pain: Secondary | ICD-10-CM

## 2021-06-01 DIAGNOSIS — M533 Sacrococcygeal disorders, not elsewhere classified: Secondary | ICD-10-CM | POA: Diagnosis not present

## 2021-06-01 DIAGNOSIS — G894 Chronic pain syndrome: Secondary | ICD-10-CM

## 2021-06-01 MED ORDER — TRAMADOL HCL 50 MG PO TABS: 50.0000 mg | ORAL_TABLET | Freq: Two times a day (BID) | ORAL | 1 refills | Status: DC | PRN

## 2021-06-01 NOTE — Progress Notes (Signed)
Patient: Carol [name]  Service Category: E/M  Provider: Gillis Santa, MD  ?DOB: 15-Jun-1945  DOS: 06/01/2021  Location: Office  ?MRN: [id number]  Setting: Ambulatory outpatient  Referring Provider: Sallee Lange, *  ?Type: Established Patient  Specialty: Interventional Pain Management  PCP: Sallee Lange, NP  ?Location: Remote location  Delivery: TeleHealth    ? ?Virtual Encounter - Pain Management ?PROVIDER NOTE: Information contained herein reflects review and annotations entered in association with encounter. Interpretation of such information and data should be left to medically-trained personnel. Information provided to patient can be located elsewhere in the medical record under "Patient Instructions". Document created using STT-dictation technology, any transcriptional errors that may result from process are unintentional.  ?  ?Contact & Pharmacy ?Preferred: [phone] ?Home: [phone] (home) ?Mobile: [phone] (mobile) ?E-mail: [email or web address]  ?[facility] [place], [facility] ?[address] ?[place] [address] ?Phone: [phone] Fax: [phone] ?  ?Pre-screening  ?Carol [name] offered "in-person" vs "virtual" encounter. She indicated preferring virtual for this encounter.  ? ?Reason ?COVID-19*  Social distancing based on CDC and AMA recommendations.  ? ?I contacted Carol [name] on 06/01/2021 via telephone.      I clearly identified myself as Gillis Santa, MD. I verified that I was speaking with the correct person using two identifiers (Name: Carol [name], and date of birth: 03/16/1946). ? ?Consent ?I sought verbal advanced consent from Carol [name] for virtual visit interactions. I informed Carol [name] of possible security and privacy concerns, risks, and limitations associated with providing "not-in-person" medical evaluation and management services. I also informed Carol [name] of the availability of "in-person" appointments.  Finally, I informed her that there would be a charge for the virtual visit and that she could be  personally, fully or partially, financially responsible for it. Carol [name] expressed understanding and agreed to proceed.  ? ?Historic Elements   ?Carol [name] is a 76 y.o. year old, female patient evaluated today after our last contact on 05/04/2021. Carol [name]  has a past medical history of Anxiety, Arthritis, COVID-19 (11/2020), Depression, Headache, Hypertension, Multilevel degenerative disc disease, Positive skin test for tuberculosis, Wears contact lenses, Wears dentures, and Wears hearing aid in right ear. She also  has a past surgical history that includes Abdominal hysterectomy; Tonsillectomy; Breast surgery (Right); Tubal ligation; Myringotomy with tube placement (Right); Hand tendon surgery (Right, 2016); Lumbar fusion; Joint replacement (Right); Back surgery; Ankle reconstruction (Left, 10/30/2019); Ankle arthroscopy (Left, 10/30/2019); Cataract extraction w/PHACO (Left, 01/03/2021); and Cataract extraction w/PHACO (Right, 01/17/2021). Carol [name] has a current medication list which includes the following prescription(s): alendronate, calcium carbonate-vitamin d, cetirizine, chlorthalidone, clonazepam, cyclobenzaprine, diclofenac sodium, fluoxetine, fluticasone, fluticasone, glucose, klor-con m20, meloxicam, meloxicam, psyllium-calcium, pyridoxine, rizatriptan, rosuvastatin, sumatriptan, venlafaxine xr, vitamin d3, tramadol, and trazodone. She  reports that she quit smoking about 45 years ago. Her smoking use included cigarettes. She has never used smokeless tobacco. She reports current drug use. Frequency: 1.00 time per week. Drug: Marijuana. She reports that she does not drink alcohol. Carol [name] is allergic to gentamicin, celecoxib, hydralazine, ibandronic acid, oxycodone hcl, and penicillins.  ? ?HPI  ?Today, she is being contacted for a post-procedure assessment. ? ? ?Post-procedure evaluation   ? ?Type: Diagnostic Epidural Steroid Injection #2 (#1 09/01/19)  ?Region: Caudal ?Level: Sacrococcygeal   ?Laterality: Midline   ? ?Effectiveness:  ?Initial hour after procedure: 90 %  ?Subsequent 4-6 hours post-procedure: 100 %  ?Analgesia past  initial 6 hours: 0 %  ?Ongoing improvement:  ?Analgesic: 0% ?Function: No improvement ?ROM: No improvement ? ? ?Laboratory Chemistry Profile  ? ?Renal ?Lab Results  ?Component Value Date  ? BUN 15 07/06/2020  ? CREATININE 0.99 07/06/2020  ? BCR 18 07/19/2016  ? GFRAA >60 09/24/2019  ? GFRNONAA >60 07/06/2020  ?  Hepatic ?Lab Results  ?Component Value Date  ? AST 30 09/24/2019  ? ALT 34 09/24/2019  ? ALBUMIN 4.3 09/24/2019  ? ALKPHOS 57 09/24/2019  ? LIPASE 47 07/06/2020  ?  ?Electrolytes ?Lab Results  ?Component Value Date  ? NA 135 07/06/2020  ? K 2.4 (LL) 07/06/2020  ? CL 98 07/06/2020  ? CALCIUM 9.7 07/06/2020  ? MG 1.8 07/06/2020  ?  Bone ?Lab Results  ?Component Value Date  ? VD25OH 56.6 04/26/2017  ?  ?Inflammation (CRP: Acute Phase) (ESR: Chronic Phase) ?No results found for: CRP, ESRSEDRATE, LATICACIDVEN    ?  ? ?Note: Above Lab results reviewed. ? ? ?Assessment  ?The primary encounter diagnosis was Lumbar radiculopathy. Diagnoses of Chronic radicular lumbar pain, Lumbar facet arthropathy, Lumbar spondylosis, SI joint arthritis, Sacroiliac joint pain, and Chronic pain syndrome were also pertinent to this visit. ? ?Plan of Care  ? ?Carol [name] has a current medication list which includes the following long-term medication(s): calcium carbonate-vitamin d, chlorthalidone, fluoxetine, fluticasone, fluticasone, glucose, sumatriptan, venlafaxine xr, and trazodone. ? ? ?Unfortunately no benefit with her previous caudal ESI.  She has an upcoming appointment with her previous neurosurgeon.  She has a history of L2-L5 fusion.  I informed her that she could be a candidate for spinal cord stimulation and that if she is interested in this after meeting with her  neurosurgeon, that I am happy to offer her a trial here.  We will need a lumbar, thoracic MRI as well as psych eval prior to scheduling.  However her interlaminar windows at T12-L1 seem appropriate for percutaneous access.  Given increased pain, patient is requesting a refill of her tramadol as below.  She will contact us after she has been evaluated by neurosurgery, Dr. Trenton Gammon ? ?Pharmacotherapy (Medications Ordered): ?Meds ordered this encounter  ?Medications  ? traMADol (ULTRAM) 50 MG tablet  ?  Sig: Take 1 tablet (50 mg total) by mouth every 12 (twelve) hours as needed.  ?  Dispense:  60 tablet  ?  Refill:  1  ? ?Orders:  ?No orders of the defined types were placed in this encounter. ? ?Follow-up plan:   ?Return if symptoms worsen or fail to improve.   ?  ?Status post caudal ESI on 09/01/2019, 05/04/21.  Interlaminar window seem appropriate for spinal cord stimulator trial  ?  ?Recent Visits ?Date Type Provider Dept  ?05/04/21 Procedure visit Gillis Santa, MD [facility]  ?04/19/21 Office Visit Gillis Santa, MD [facility]  ?Showing recent visits within past 90 days and meeting all other requirements ?Today's Visits ?Date Type Provider Dept  ?06/01/21 Office Visit Gillis Santa, MD [facility]  ?Showing today's visits and meeting all other requirements ?Future Appointments ?No visits were found meeting these conditions. ?Showing future appointments within next 90 days and meeting all other requirements ? ?I discussed the assessment and treatment plan with the patient. The patient was provided an opportunity to ask questions and all were answered. The patient agreed with the plan and demonstrated an understanding of the instructions. ? ?Patient advised to call back or seek an in-person evaluation if the symptoms  or condition worsens. ? ?Duration of encounter: 76mnutes. ? ?Note by: BGillis Santa MD ?Date: 06/01/2021; Time: 3:31 PM ?

## 2021-11-25 ENCOUNTER — Other Ambulatory Visit: Payer: Self-pay | Admitting: Nurse Practitioner

## 2021-11-25 DIAGNOSIS — Z1231 Encounter for screening mammogram for malignant neoplasm of breast: Secondary | ICD-10-CM

## 2021-12-09 ENCOUNTER — Telehealth: Payer: Self-pay | Admitting: Student in an Organized Health Care Education/Training Program

## 2021-12-09 ENCOUNTER — Other Ambulatory Visit: Payer: Self-pay | Admitting: Neurosurgery

## 2021-12-09 NOTE — Telephone Encounter (Signed)
Per vmail 02-02-22, patient just left neurologist, her fusion has cracked. She is having extreme sciatica from it. Wants to know if Dr Holley Raring can send something to pharmacy for this. Wants to speak with someone. Has appt. Thurs

## 2021-12-12 NOTE — Telephone Encounter (Signed)
Patient notified, she already has appt for tomorrow.

## 2021-12-13 ENCOUNTER — Encounter: Payer: Self-pay | Admitting: Student in an Organized Health Care Education/Training Program

## 2021-12-13 ENCOUNTER — Ambulatory Visit
Payer: Medicare Other | Attending: Student in an Organized Health Care Education/Training Program | Admitting: Student in an Organized Health Care Education/Training Program

## 2021-12-13 VITALS — BP 149/90 | HR 119 | Temp 97.4°F | Resp 18 | Ht 63.0 in | Wt 179.0 lb

## 2021-12-13 DIAGNOSIS — M5010 Cervical disc disorder with radiculopathy, unspecified cervical region: Secondary | ICD-10-CM | POA: Insufficient documentation

## 2021-12-13 DIAGNOSIS — G894 Chronic pain syndrome: Secondary | ICD-10-CM | POA: Insufficient documentation

## 2021-12-13 DIAGNOSIS — S129XXS Fracture of neck, unspecified, sequela: Secondary | ICD-10-CM | POA: Diagnosis not present

## 2021-12-13 MED ORDER — TRAMADOL HCL 50 MG PO TABS: 50.0000 mg | ORAL_TABLET | Freq: Three times a day (TID) | ORAL | 0 refills | Status: DC | PRN

## 2021-12-13 NOTE — Progress Notes (Signed)
PROVIDER NOTE: Information contained herein reflects review and annotations entered in association with encounter. Interpretation of such information and data should be left to medically-trained personnel. Information provided to patient can be located elsewhere in the medical record under "Patient Instructions". Document created using STT-dictation technology, any transcriptional errors that may result from process are unintentional.    Patient: Carol [name]  Service Category: E/M  Provider: Gillis Santa, MD  DOB: 1945-09-21  DOS: 12/13/2021  Specialty: Interventional Pain Management  MRN: [id number]  Setting: Ambulatory outpatient  PCP: Sallee Lange, NP  Type: Established Patient    Referring Provider: Sallee Lange, *  Location: Office  Delivery: Face-to-face     HPI  Reason for encounter: Carol [name], a 76 y.o. year old female, is here today for evaluation and management of her Cervical disc disorder with radiculopathy of cervical region [M50.10]. Carol [name] primary complain today is Neck Pain Last encounter: Practice (09/02/2019). My last encounter with her was on 06/01/21 Pertinent problems: Carol [name] has Degenerative spondylolisthesis; Fibromyalgia; OA (osteoarthritis); Status post left partial knee replacement; Osteopenia of spine; History of lumbar fusion; Hx of fusion of cervical spine; Obesity (BMI 30.0-34.9); Leg pain, left; Lumbar radiculopathy; and Chronic pain syndrome on their pertinent problem list. Pain Assessment: Severity of Acute pain (2 weeks, continues to worsen) is reported as a 10-Worst pain ever/10. Location: Neck  /to shoulders down arms; down middle of backs of legs bilat to calves. Onset: 1 to 4 weeks ago. Quality: Aching. Timing: Constant. Modifying factor(s): nothing. Vitals:  height is $RemoveB'5\' 3"'uTQzBUZJ$  (1.6 m) and weight is 179 lb (81.2 kg). Her temporal temperature is 97.4 F (36.3 C) (abnormal). Her blood pressure is 149/90 (abnormal) and her pulse is 119  (abnormal). Her respiration is 18 and oxygen saturation is 100%.    Patient has cervical spine surgery scheduled for next month with Dr. Trenton Gammon.  She has known pseudoarthrosis with angulation at C5-C6.  She has a history of C6-C7 fusion. She is is requesting a temporary increase in her tramadol given increased pain to help manage her pain until her cervical spine surgery. I also provided her with a surgeons note detailing perioperative opioid prescribing which is to be done by surgical team.  Once the patient's acute postoperative pain is improved after 2 to 4 weeks, I have recommended the patient see me back again for chronic pain management.   ROS  Constitutional: Denies any fever or chills Gastrointestinal: No reported hemesis, hematochezia, vomiting, or acute GI distress Musculoskeletal:  Cervical spine pain with radiation into bilateral arms and hands Neurological: No reported episodes of acute onset apraxia, aphasia, dysarthria, agnosia, amnesia, paralysis, loss of coordination, or loss of consciousness  Medication Review  Calcium Carbonate-Vitamin D, Cholecalciferol, Docusate Calcium, SUMAtriptan, cetirizine, chlorthalidone, clonazePAM, diclofenac Sodium, fluticasone, potassium chloride SA, pyridOXINE, rizatriptan, traMADol, traZODone, and venlafaxine XR  History Review  Allergy: Carol [name] is allergic to gentamicin, celecoxib, hydralazine, ibandronic acid, oxycodone hcl, and penicillins. Drug: Carol [name]  reports current drug use. Frequency: 1.00 time per week. Drug: Marijuana. Alcohol:  reports no history of alcohol use. Tobacco:  reports that she quit smoking about 45 years ago. Her smoking use included cigarettes. She has never used smokeless tobacco. Social: Carol [name]  reports that she quit smoking about 45 years ago. Her smoking use included cigarettes. She has never used smokeless tobacco. She reports current drug use. Frequency: 1.00 time per week. Drug: Marijuana. She reports  that she does not drink  alcohol. Medical:  has a past medical history of Anxiety, Arthritis, COVID-19 (11/2020), Depression, Headache, Hypertension, Multilevel degenerative disc disease, Positive skin test for tuberculosis, Wears contact lenses, Wears dentures, and Wears hearing aid in right ear. Surgical: Carol [name]  has a past surgical history that includes Abdominal hysterectomy; Tonsillectomy; Breast surgery (Right); Tubal ligation; Myringotomy with tube placement (Right); Hand tendon surgery (Right, 2016); Lumbar fusion; Joint replacement (Right); Back surgery; Ankle reconstruction (Left, 10/30/2019); Ankle arthroscopy (Left, 10/30/2019); Cataract extraction w/PHACO (Left, 01/03/2021); and Cataract extraction w/PHACO (Right, 01/17/2021). Family: family history includes Aneurysm in her mother; Heart disease in her mother; Hyperlipidemia in her brother and mother; Hypertension in her brother and mother; Lung cancer in her brother.  Laboratory Chemistry Profile   Renal Lab Results  Component Value Date   BUN 15 07/06/2020   CREATININE 0.99 07/06/2020   BCR 18 07/19/2016   GFRAA >60 09/24/2019   GFRNONAA >60 07/06/2020     Hepatic Lab Results  Component Value Date   AST 30 09/24/2019   ALT 34 09/24/2019   ALBUMIN 4.3 09/24/2019   ALKPHOS 57 09/24/2019   LIPASE 47 07/06/2020     Electrolytes Lab Results  Component Value Date   NA 135 07/06/2020   K 2.4 (LL) 07/06/2020   CL 98 07/06/2020   CALCIUM 9.7 07/06/2020   MG 1.8 07/06/2020     Bone Lab Results  Component Value Date   VD25OH 56.6 04/26/2017     Inflammation (CRP: Acute Phase) (ESR: Chronic Phase) No results found for: "CRP", "ESRSEDRATE", "LATICACIDVEN"     Note: Above Lab results reviewed.  Recent Imaging Review  DG PAIN CLINIC C-ARM 1-60 MIN NO REPORT Fluoro was used, but no Radiologist interpretation will be provided.  Please refer to "NOTES" tab for provider progress note. Note: Reviewed        Physical  Exam  General appearance: Well nourished, well developed, and well hydrated. In no apparent acute distress Mental status: Alert, oriented x 3 (person, place, & time)       Respiratory: No evidence of acute respiratory distress Eyes: PERLA Vitals: BP (!) 149/90   Pulse (!) 119   Temp (!) 97.4 F (36.3 C) (Temporal)   Resp 18   Ht $R'5\' 3"'or$  (1.6 m)   Wt 179 lb (81.2 kg)   SpO2 100%   BMI 31.71 kg/m  BMI: Estimated body mass index is 31.71 kg/m as calculated from the following:   Height as of this encounter: $RemoveBeforeD'5\' 3"'xQGTlKRnqKDaPy$  (1.6 m).   Weight as of this encounter: 179 lb (81.2 kg). Ideal: Ideal body weight: 52.4 kg (115 lb 8.3 oz) Adjusted ideal body weight: 63.9 kg (140 lb 14.6 oz)  Cervical Spine Area Exam  Skin & Axial Inspection: No masses, redness, edema, swelling, or associated skin lesions Alignment: Symmetrical Functional ROM: Pain restricted ROM, bilaterally Stability: No instability detected Muscle Tone/Strength: Functionally intact. No obvious neuro-muscular anomalies detected. Sensory (Neurological): Dermatomal pain pattern Palpation: No palpable anomalies             Upper Extremity (UE) Exam    Side: Right upper extremity  Side: Left upper extremity  Skin & Extremity Inspection: Skin color, temperature, and hair growth are WNL. No peripheral edema or cyanosis. No masses, redness, swelling, asymmetry, or associated skin lesions. No contractures.  Skin & Extremity Inspection: Skin color, temperature, and hair growth are WNL. No peripheral edema or cyanosis. No masses, redness, swelling, asymmetry, or associated skin lesions. No contractures.  Functional ROM:  Unrestricted ROM          Functional ROM: Unrestricted ROM          Muscle Tone/Strength: Functionally intact. No obvious neuro-muscular anomalies detected.  Muscle Tone/Strength: Functionally intact. No obvious neuro-muscular anomalies detected.  Sensory (Neurological): Dermatomal pain pattern          Sensory (Neurological):  Dermatomal pain pattern          Palpation: No palpable anomalies              Palpation: No palpable anomalies              Provocative Test(s):  Phalen's test: deferred Tinel's test: deferred Apley's scratch test (touch opposite shoulder):  Action 1 (Across chest): deferred Action 2 (Overhead): deferred Action 3 (LB reach): deferred   Provocative Test(s):  Phalen's test: deferred Tinel's test: deferred Apley's scratch test (touch opposite shoulder):  Action 1 (Across chest): deferred Action 2 (Overhead): deferred Action 3 (LB reach): deferred     Lumbar Spine Area Exam  Skin & Axial Inspection: Well healed scar from previous spine surgery detected Alignment: Symmetrical Functional ROM: Pain restricted ROM       Stability: No instability detected Muscle Tone/Strength: Functionally intact. No obvious neuro-muscular anomalies detected. Sensory (Neurological): Dermatomal pain pattern  Gait & Posture Assessment  Ambulation: Limited Gait: Antalgic Posture: Difficulty standing up straight, due to pain    Lower Extremity Exam    Side: Right lower extremity  Side: Left lower extremity  Stability: No instability observed          Stability: No instability observed          Skin & Extremity Inspection: Skin color, temperature, and hair growth are WNL. No peripheral edema or cyanosis. No masses, redness, swelling, asymmetry, or associated skin lesions. No contractures.  Skin & Extremity Inspection: Skin color, temperature, and hair growth are WNL. No peripheral edema or cyanosis. No masses, redness, swelling, asymmetry, or associated skin lesions. No contractures.  Functional ROM: Pain restricted ROM for all joints of the lower extremity          Functional ROM: Pain restricted ROM for all joints of the lower extremity          Muscle Tone/Strength: Functionally intact. No obvious neuro-muscular anomalies detected.  Muscle Tone/Strength: Functionally intact. No obvious neuro-muscular  anomalies detected.  Sensory (Neurological): Neurogenic pain pattern        Sensory (Neurological): Neurogenic pain pattern        DTR: Patellar: deferred today Achilles: deferred today Plantar: deferred today  DTR: Patellar: deferred today Achilles: deferred today Plantar: deferred today  Palpation: No palpable anomalies  Palpation: No palpable anomalies    Assessment   Status Diagnosis  Having a Flare-up Having a Flare-up Having a Flare-up 1. Cervical disc disorder with radiculopathy of cervical region   2. Pseudoarthrosis of cervical spine, sequela   3. Chronic pain syndrome      Updated Problems: No problems updated.   Plan of Care  Carol [name] has a current medication list which includes the following long-term medication(s): calcium carbonate-vitamin d, chlorthalidone, fluticasone, sumatriptan, trazodone, and venlafaxine xr.  Orders:  No orders of the defined types were placed in this encounter.  Requested Prescriptions   Signed Prescriptions Disp Refills   traMADol (ULTRAM) 50 MG tablet 180 tablet 0    Sig: Take 1-2 tablets (50-100 mg total) by mouth every 8 (eight) hours as needed.  PMP checked and reviewed.  Follow-up plan:   Return in about 7 weeks (around 02/02/2022) for Medication Management, in person.     Status post caudal ESI on 09/01/2019: Helped significantly, repeat as needed.  Interlaminar window seem appropriate for spinal cord stimulator trial    Recent Visits No visits were found meeting these conditions. Showing recent visits within past 90 days and meeting all other requirements Today's Visits Date Type Provider Dept  12/13/21 Office Visit Gillis Santa, MD [facility]  Showing today's visits and meeting all other requirements Future Appointments Date Type Provider Dept  01/24/22 Appointment Gillis Santa, MD [facility]  Showing future appointments within next 90 days and meeting all other requirements  I  discussed the assessment and treatment plan with the patient. The patient was provided an opportunity to ask questions and all were answered. The patient agreed with the plan and demonstrated an understanding of the instructions.  Patient advised to call back or seek an in-person evaluation if the symptoms or condition worsens.  Duration of encounter:30 minutes.  Note by: Gillis Santa, MD Date: 12/13/2021; Time: 3:28 PM

## 2021-12-13 NOTE — Progress Notes (Signed)
Nursing Pain Medication Assessment:  Safety precautions to be maintained throughout the outpatient stay will include: orient to surroundings, keep bed in low position, maintain call bell within reach at all times, provide assistance with transfer out of bed and ambulation.  Medication Inspection Compliance: Ms. Keeven did not comply with our request to bring her pills to be counted. She was reminded that bringing the medication bottles, even when empty, is a requirement.  Medication: None brought in. Pill/Patch Count: None available to be counted. Bottle Appearance: No container available. Did not bring bottle(s) to appointment. Filled Date: N/A Last Medication intake:   12/09/21

## 2021-12-14 ENCOUNTER — Ambulatory Visit: Payer: Medicare Other

## 2021-12-15 NOTE — Pre-Procedure Instructions (Signed)
Surgical Instructions    Your procedure is scheduled on Tuesday, December 20, 2021 at 8:00 AM.  Report to [facility] Main Entrance "A" at 6:00 A.M., then check in with the Admitting office.  Call this number if you have problems the morning of surgery:  [phone]   If you have any questions prior to your surgery date call [phone]: Open Monday-Friday 8am-4pm  *If you experience any cold or flu symptoms such as cough, fever, chills, shortness of breath, etc. between now and your scheduled surgery, please notify us.*    Remember:  Do not eat or drink after midnight the night before your surgery    Take these medicines the morning of surgery with A SIP OF WATER:  cetirizine (ZYRTEC) venlafaxine XR (EFFEXOR-XR)  IF NEEDED: clonazePAM (KLONOPIN)  fluticasone (FLONASE)  rizatriptan (MAXALT) SUMAtriptan (IMITREX)  traMADol (ULTRAM)   As of today, STOP taking any Aspirin (unless otherwise instructed by your surgeon) Aleve, Naproxen, Ibuprofen, Motrin, Advil, Goody's, BC's, all herbal medications, fish oil, and all vitamins. This includes your diclofenac Sodium (VOLTAREN) 1 % GEL.                     Do NOT Smoke (Tobacco/Vaping) for 24 hours prior to your procedure.  If you use a CPAP at night, you may bring your mask/headgear for your overnight stay.   Contacts, glasses, piercing's, hearing aid's, dentures or partials may not be worn into surgery, please bring cases for these belongings.    For patients admitted to the hospital, discharge time will be determined by your treatment team.   Patients discharged the day of surgery will not be allowed to drive home, and someone needs to stay with them for 24 hours.  SURGICAL WAITING ROOM VISITATION Patients having surgery or a procedure may have two support people in the waiting area. Visitors may stay in the waiting area during the procedure and switch out with other visitors if needed. Children under the age of 18 must have an  adult accompany them who is not the patient. If the patient needs to stay at the hospital during part of their recovery, the visitor guidelines for inpatient rooms apply.  Please refer to the [facility] website for the visitor guidelines for Inpatients (after your surgery is over and you are in a regular room).    Special instructions:   [facility]- Preparing For Surgery  Before surgery, you can play an important role. Because skin is not sterile, your skin needs to be as free of germs as possible. You can reduce the number of germs on your skin by washing with CHG (chlorahexidine gluconate) Soap before surgery.  CHG is an antiseptic cleaner which kills germs and bonds with the skin to continue killing germs even after washing.    Oral Hygiene is also important to reduce your risk of infection.  Remember - BRUSH YOUR TEETH THE MORNING OF SURGERY WITH YOUR REGULAR TOOTHPASTE  Please do not use if you have an allergy to CHG or antibacterial soaps. If your skin becomes reddened/irritated stop using the CHG.  Do not shave (including legs and underarms) for at least 48 hours prior to first CHG shower. It is OK to shave your face.  Please follow these instructions carefully.   Shower the NIGHT BEFORE SURGERY and the MORNING OF SURGERY  If you chose to wash your hair, wash your hair first as usual with your normal shampoo.  After you shampoo, rinse your hair and  body thoroughly to remove the shampoo.  Use CHG Soap as you would any other liquid soap. You can apply CHG directly to the skin and wash gently with a scrungie or a clean washcloth.   Apply the CHG Soap to your body ONLY FROM THE NECK DOWN.  Do not use on open wounds or open sores. Avoid contact with your eyes, ears, mouth and genitals (private parts). Wash Face and genitals (private parts)  with your normal soap.   Wash thoroughly, paying special attention to the area where your surgery will be performed.  Thoroughly rinse your body  with warm water from the neck down.  DO NOT shower/wash with your normal soap after using and rinsing off the CHG Soap.  Pat yourself dry with a CLEAN TOWEL.  Wear CLEAN PAJAMAS to bed the night before surgery  Place CLEAN SHEETS on your bed the night before your surgery  DO NOT SLEEP WITH PETS.   Day of Surgery: Take a shower with CHG soap. Do not wear jewelry or makeup Do not wear lotions, powders, perfumes/colognes, or deodorant. Do not shave 48 hours prior to surgery. Do not bring valuables to the hospital.  [facility] is not responsible for any belongings or valuables. Do not wear nail polish, gel polish, artificial nails, or any other type of covering on natural nails (fingers and toes) If you have artificial nails or gel coating that need to be removed by a nail salon, please have this removed prior to surgery. Artificial nails or gel coating may interfere with anesthesia's ability to adequately monitor your vital signs. Wear Clean/Comfortable clothing the morning of surgery Do not apply any deodorants/lotions.   Remember to brush your teeth WITH YOUR REGULAR TOOTHPASTE.   Please read over the following fact sheets that you were given.  If you received a COVID test during your pre-op visit  it is requested that you wear a mask when out in public, stay away from anyone that may not be feeling well and notify your surgeon if you develop symptoms. If you have been in contact with anyone that has tested positive in the last 10 days please notify you surgeon.

## 2021-12-16 ENCOUNTER — Other Ambulatory Visit: Payer: Self-pay

## 2021-12-16 ENCOUNTER — Encounter (HOSPITAL_COMMUNITY)
Admission: RE | Admit: 2021-12-16 | Discharge: 2021-12-16 | Disposition: A | Discharge status: Home / Self Care | Payer: Medicare Other | Source: Ambulatory Visit | Attending: Neurosurgery | Admitting: Neurosurgery

## 2021-12-16 ENCOUNTER — Encounter (HOSPITAL_COMMUNITY): Payer: Self-pay

## 2021-12-16 VITALS — BP 132/71 | HR 108 | Temp 98.3°F | Resp 17 | Ht 63.0 in | Wt 184.4 lb

## 2021-12-16 DIAGNOSIS — I10 Essential (primary) hypertension: Secondary | ICD-10-CM

## 2021-12-16 DIAGNOSIS — R0609 Other forms of dyspnea: Secondary | ICD-10-CM | POA: Insufficient documentation

## 2021-12-16 DIAGNOSIS — E876 Hypokalemia: Secondary | ICD-10-CM | POA: Insufficient documentation

## 2021-12-16 DIAGNOSIS — Z8616 Personal history of COVID-19: Secondary | ICD-10-CM | POA: Diagnosis not present

## 2021-12-16 DIAGNOSIS — Z01812 Encounter for preprocedural laboratory examination: Secondary | ICD-10-CM | POA: Diagnosis present

## 2021-12-16 DIAGNOSIS — Z01818 Encounter for other preprocedural examination: Secondary | ICD-10-CM

## 2021-12-16 LAB — CBC
HCT: 42.8 % (ref 36.0–46.0)
Hemoglobin: 14.9 g/dL (ref 12.0–15.0)
MCH: 32.5 pg (ref 26.0–34.0)
MCHC: 34.8 g/dL (ref 30.0–36.0)
MCV: 93.4 fL (ref 80.0–100.0)
Platelets: 271 10*3/uL (ref 150–400)
RBC: 4.58 MIL/uL (ref 3.87–5.11)
RDW: 14 % (ref 11.5–15.5)
WBC: 13.1 10*3/uL — ABNORMAL HIGH (ref 4.0–10.5)
nRBC: 0 % (ref 0.0–0.2)

## 2021-12-16 LAB — SURGICAL PCR SCREEN
MRSA, PCR: NEGATIVE
Staphylococcus aureus: NEGATIVE

## 2021-12-16 NOTE — Progress Notes (Signed)
PCP - Dr. Gaetano Net Cardiologist - Dr. Ida Rogue Pulmonology: Dr. Wallene Huh  PPM/ICD - Denies  Chest x-ray - N/A EKG - 02/28/21 Stress Test - Denies ECHO - Denies Cardiac Cath - Denies  Sleep Study - Denies  Diabetes: Denies  Blood Thinner Instructions: N/A Aspirin Instructions: N/A  ERAS Protcol - No  COVID TEST- N/A   Anesthesia review: Yes, previous abnormal EKG  Patient denies shortness of breath, fever, cough and chest pain at PAT appointment   All instructions explained to the patient, with a verbal understanding of the material. Patient agrees to go over the instructions while at home for a better understanding. Patient also instructed to self quarantine after being tested for COVID-19. The opportunity to ask questions was provided.

## 2021-12-17 LAB — COMPREHENSIVE METABOLIC PANEL
ALT: 49 U/L — ABNORMAL HIGH (ref 0–44)
AST: 42 U/L — ABNORMAL HIGH (ref 15–41)
Albumin: 3.8 g/dL (ref 3.5–5.0)
Alkaline Phosphatase: 69 U/L (ref 38–126)
Anion gap: 11 (ref 5–15)
BUN: 15 mg/dL (ref 8–23)
CO2: 29 mmol/L (ref 22–32)
Calcium: 9.6 mg/dL (ref 8.9–10.3)
Chloride: 99 mmol/L (ref 98–111)
Creatinine, Ser: 0.92 mg/dL (ref 0.44–1.00)
GFR, Estimated: >60 mL/min (ref >60)
Glucose, Bld: 125 mg/dL — ABNORMAL HIGH (ref 70–99)
Potassium: 2.7 mmol/L — CL (ref 3.5–5.1)
Sodium: 139 mmol/L (ref 135–145)
Total Bilirubin: 0.9 mg/dL (ref 0.3–1.2)
Total Protein: 7.5 g/dL (ref 6.5–8.1)

## 2021-12-17 NOTE — Progress Notes (Signed)
CRITICAL VALUE STICKER  CRITICAL VALUE: Potassium 2.7  RECEIVER (on-site recipient of call): Grace Blight, RN  [place] NOTIFIED: 1310 12/17/21  MD NOTIFIED: Dr. Ashok Pall  TIME OF NOTIFICATION: 8416 12/17/21  RESPONSE: "Ok, Thank you"

## 2021-12-19 ENCOUNTER — Inpatient Hospital Stay (HOSPITAL_COMMUNITY): Payer: Medicare Other | Admitting: Physician Assistant

## 2021-12-19 NOTE — Anesthesia Preprocedure Evaluation (Deleted)
Anesthesia Evaluation  Patient identified by MRN, date of birth, ID band Patient awake    Reviewed: Allergy & Precautions, NPO status , Patient's Chart, lab work & pertinent test results  Airway Mallampati: II  TM Distance: >3 FB Neck ROM: Full    Dental no notable dental hx.    Pulmonary neg pulmonary ROS, former smoker,    Pulmonary exam normal breath sounds clear to auscultation       Cardiovascular hypertension, Pt. on medications Normal cardiovascular exam Rhythm:Regular Rate:Normal     Neuro/Psych negative neurological ROS  negative psych ROS   GI/Hepatic negative GI ROS, Neg liver ROS,   Endo/Other  negative endocrine ROS  Renal/GU negative Renal ROS  negative genitourinary   Musculoskeletal negative musculoskeletal ROS (+)   Abdominal   Peds negative pediatric ROS (+)  Hematology negative hematology ROS (+)   Anesthesia Other Findings   Reproductive/Obstetrics negative OB ROS                            Anesthesia Physical Anesthesia Plan  ASA: 2  Anesthesia Plan: General   Post-op Pain Management:    Induction: Intravenous  PONV Risk Score and Plan: 3 and Ondansetron, Dexamethasone and Treatment may vary due to age or medical condition  Airway Management Planned: Oral ETT  Additional Equipment:   Intra-op Plan:   Post-operative Plan: Extubation in OR  Informed Consent: I have reviewed the patients History and Physical, chart, labs and discussed the procedure including the risks, benefits and alternatives for the proposed anesthesia with the patient or authorized representative who has indicated his/her understanding and acceptance.     Dental advisory given  Plan Discussed with: CRNA and Surgeon  Anesthesia Plan Comments: (PAT note by Karoline Caldwell, PA-C:  Patient with history of hypokalemia, maintained on Klor-Con 53mEq tablets twice daily.  Potassium on preop  labs 2.7 which is down from recent labs at PCPs office 11/15/2021, potassium 3.5 at that time.  Review of records shows baseline potassium ~3.2.  His preop lab result was called to surgeon's office.  We will recheck i-STAT on day of surgery.  Remainder of preop labs unremarkable.  Patient had prolonged DOE post-COVID and was following with pulmonology at [facility].  When last seen 02/02/2021 she was noted to be returned to baseline.  Per note, fvc 3.49 liters ( 123%), fev1 2.60 liters ( 118%), fef 96 %, exp flow volume loop is normal, no change, normal spiro.  History of C4-6 ACDF.  EKG 02/28/2021: NSR.  Rate 86.  Possible LAE.  Prolonged QT (QTc 481)  )       Anesthesia Quick Evaluation

## 2021-12-19 NOTE — Progress Notes (Signed)
Anesthesia Chart Review:  Patient with history of hypokalemia, maintained on Klor-Con 32mEq tablets twice daily.  Potassium on preop labs 2.7 which is down from recent labs at PCPs office 11/15/2021, potassium 3.5 at that time.  Review of records shows baseline potassium ~3.2.  His preop lab result was called to surgeon's office.  We will recheck i-STAT on day of surgery.  Remainder of preop labs unremarkable.  Patient had prolonged DOE post-COVID and was following with pulmonology at [facility].  When last seen 02/02/2021 she was noted to be returned to baseline.  Per note, fvc 3.49 liters ( 123%), fev1 2.60 liters ( 118%), fef 96 %, exp flow volume loop is normal, no change, normal spiro.  History of C4-6 ACDF.  EKG 02/28/2021: NSR.  Rate 86.  Possible LAE.  Prolonged QT (QTc 481)    Wynonia Musty [facility] Short Stay Center/Anesthesiology Phone [phone] 12/19/2021 10:09 AM

## 2021-12-20 ENCOUNTER — Encounter (HOSPITAL_COMMUNITY)
Admission: RE | Disposition: A | Discharge status: Home / Self Care | Payer: Self-pay | Source: Home / Self Care | Attending: Neurosurgery

## 2021-12-20 ENCOUNTER — Encounter (HOSPITAL_COMMUNITY): Payer: Self-pay | Admitting: Neurosurgery

## 2021-12-20 ENCOUNTER — Ambulatory Visit (HOSPITAL_COMMUNITY)
Admission: RE | Admit: 2021-12-20 | Discharge: 2021-12-20 | Disposition: A | Discharge status: Home / Self Care | Payer: Medicare Other | Attending: Neurosurgery | Admitting: Neurosurgery

## 2021-12-20 ENCOUNTER — Inpatient Hospital Stay (HOSPITAL_COMMUNITY): Payer: Medicare Other

## 2021-12-20 DIAGNOSIS — E876 Hypokalemia: Secondary | ICD-10-CM | POA: Insufficient documentation

## 2021-12-20 LAB — POCT I-STAT, CHEM 8
BUN: 9 mg/dL (ref 8–23)
BUN: 7 mg/dL — ABNORMAL LOW (ref 8–23)
Calcium, Ion: 1.1 mmol/L — ABNORMAL LOW (ref 1.15–1.40)
Calcium, Ion: 1.09 mmol/L — ABNORMAL LOW (ref 1.15–1.40)
Chloride: 94 mmol/L — ABNORMAL LOW (ref 98–111)
Chloride: 95 mmol/L — ABNORMAL LOW (ref 98–111)
Creatinine, Ser: 0.8 mg/dL (ref 0.44–1.00)
Creatinine, Ser: 0.7 mg/dL (ref 0.44–1.00)
Glucose, Bld: 115 mg/dL — ABNORMAL HIGH (ref 70–99)
Glucose, Bld: 86 mg/dL (ref 70–99)
HCT: 39 % (ref 36.0–46.0)
HCT: 38 % (ref 36.0–46.0)
Hemoglobin: 13.3 g/dL (ref 12.0–15.0)
Hemoglobin: 12.9 g/dL (ref 12.0–15.0)
Potassium: 2.4 mmol/L — CL (ref 3.5–5.1)
Potassium: 2.8 mmol/L — ABNORMAL LOW (ref 3.5–5.1)
Sodium: 135 mmol/L (ref 135–145)
Sodium: 135 mmol/L (ref 135–145)
TCO2: 30 mmol/L (ref 22–32)
TCO2: 28 mmol/L (ref 22–32)

## 2021-12-20 SURGERY — POSTERIOR CERVICAL FUSION/FORAMINOTOMY LEVEL 1
Anesthesia: General

## 2021-12-20 MED ORDER — CHLORHEXIDINE GLUCONATE CLOTH 2 % EX PADS: 6.0000 | MEDICATED_PAD | Freq: Once | CUTANEOUS | Status: DC

## 2021-12-20 MED ORDER — LIDOCAINE 2% (20 MG/ML) 5 ML SYRINGE
INTRAMUSCULAR | Status: AC
  Filled 2021-12-20: qty 5

## 2021-12-20 MED ORDER — PROPOFOL 10 MG/ML IV BOLUS
INTRAVENOUS | Status: AC
  Filled 2021-12-20: qty 20

## 2021-12-20 MED ORDER — ONDANSETRON HCL 4 MG/2ML IJ SOLN
INTRAMUSCULAR | Status: AC
  Filled 2021-12-20: qty 2

## 2021-12-20 MED ORDER — FENTANYL CITRATE (PF) 250 MCG/5ML IJ SOLN
INTRAMUSCULAR | Status: AC
  Filled 2021-12-20: qty 5

## 2021-12-20 MED ORDER — CHLORHEXIDINE GLUCONATE 0.12 % MT SOLN
15.0000 mL | Freq: Once | OROMUCOSAL | Status: AC
  Administered 2021-12-20: 15 mL via OROMUCOSAL
  Filled 2021-12-20: qty 15

## 2021-12-20 MED ORDER — DEXAMETHASONE SODIUM PHOSPHATE 10 MG/ML IJ SOLN
INTRAMUSCULAR | Status: AC
  Filled 2021-12-20: qty 1

## 2021-12-20 MED ORDER — VANCOMYCIN HCL IN DEXTROSE 1-5 GM/200ML-% IV SOLN
1000.0000 mg | INTRAVENOUS | Status: DC
  Filled 2021-12-20: qty 200

## 2021-12-20 MED ORDER — ORAL CARE MOUTH RINSE: 15.0000 mL | Freq: Once | OROMUCOSAL | Status: AC

## 2021-12-20 MED ORDER — LACTATED RINGERS IV SOLN: INTRAVENOUS | Status: DC

## 2021-12-20 MED ORDER — POTASSIUM CHLORIDE 10 MEQ/100ML IV SOLN
INTRAVENOUS | Status: AC
  Filled 2021-12-20: qty 100

## 2021-12-20 MED ORDER — MIDAZOLAM HCL 2 MG/2ML IJ SOLN
INTRAMUSCULAR | Status: AC
  Filled 2021-12-20: qty 2

## 2021-12-20 MED ORDER — POTASSIUM CHLORIDE 10 MEQ/100ML IV SOLN
10.0000 meq | INTRAVENOUS | Status: AC
  Administered 2021-12-20 (×4): 10 meq via INTRAVENOUS
  Filled 2021-12-20: qty 100

## 2021-12-20 NOTE — H&P (Signed)
Admission canceled secondary to hypokalemia.

## 2021-12-20 NOTE — Progress Notes (Signed)
Patient with significant hypokalemia today.  Although this is a chronic issue for her and she takes daily potassium replacement her level of 2.4 this morning is felt to be unsafe to proceed with anesthesia.  The logistics of giving her enough IV potassium to significantly raise her potassium level and her serum is such that I do not think it is feasible to do this case today.  We will plan on discharging her home.  We will have her increase her home potassium replacement and plan on putting this back on the schedule next week.

## 2021-12-20 NOTE — Progress Notes (Signed)
Anesthesia made aware of K+2.4 this am. Stated unable to proceed with procedure until K+3. Surgeon made aware and ordered IV Potassium Runs x4 and repeat Istat afterwards. Orders received and carried out. OR desk made aware.

## 2021-12-20 NOTE — Progress Notes (Signed)
ISTAT done on patient after 4 runs of K+. K+ was 2.8. Dr. Annette Stable notified. Patient okay to discharge home with husband. IV removed, clean dry and intact. Patient stable upon discharge in wheelchair.

## 2021-12-23 NOTE — Progress Notes (Signed)
I called Dr. Annette Stable and asked him to reenter consent orders, orders were discounted when surgery was cancelled.

## 2021-12-26 ENCOUNTER — Inpatient Hospital Stay (HOSPITAL_COMMUNITY)
Admission: RE | Admit: 2021-12-26 | Discharge: 2021-12-27 | DRG: 473 | Disposition: A | Discharge status: Home / Self Care | Payer: Medicare Other | Attending: Neurosurgery | Admitting: Neurosurgery

## 2021-12-26 ENCOUNTER — Other Ambulatory Visit: Payer: Self-pay

## 2021-12-26 ENCOUNTER — Inpatient Hospital Stay (HOSPITAL_COMMUNITY): Payer: Medicare Other | Admitting: Anesthesiology

## 2021-12-26 ENCOUNTER — Inpatient Hospital Stay (HOSPITAL_COMMUNITY): Payer: Medicare Other

## 2021-12-26 ENCOUNTER — Encounter (HOSPITAL_COMMUNITY)
Admission: RE | Disposition: A | Discharge status: Home / Self Care | Payer: Self-pay | Source: Home / Self Care | Attending: Neurosurgery

## 2021-12-26 ENCOUNTER — Encounter (HOSPITAL_COMMUNITY): Payer: Self-pay | Admitting: Neurosurgery

## 2021-12-26 DIAGNOSIS — Z888 Allergy status to other drugs, medicaments and biological substances status: Secondary | ICD-10-CM | POA: Diagnosis not present

## 2021-12-26 DIAGNOSIS — Z8616 Personal history of COVID-19: Secondary | ICD-10-CM

## 2021-12-26 DIAGNOSIS — I10 Essential (primary) hypertension: Secondary | ICD-10-CM

## 2021-12-26 DIAGNOSIS — Z886 Allergy status to analgesic agent status: Secondary | ICD-10-CM | POA: Diagnosis not present

## 2021-12-26 DIAGNOSIS — Z885 Allergy status to narcotic agent status: Secondary | ICD-10-CM

## 2021-12-26 DIAGNOSIS — Z79899 Other long term (current) drug therapy: Secondary | ICD-10-CM | POA: Diagnosis not present

## 2021-12-26 DIAGNOSIS — Z881 Allergy status to other antibiotic agents status: Secondary | ICD-10-CM

## 2021-12-26 DIAGNOSIS — Z87891 Personal history of nicotine dependence: Secondary | ICD-10-CM

## 2021-12-26 DIAGNOSIS — Z8249 Family history of ischemic heart disease and other diseases of the circulatory system: Secondary | ICD-10-CM | POA: Diagnosis not present

## 2021-12-26 DIAGNOSIS — F32A Depression, unspecified: Secondary | ICD-10-CM | POA: Diagnosis present

## 2021-12-26 DIAGNOSIS — F419 Anxiety disorder, unspecified: Secondary | ICD-10-CM | POA: Diagnosis present

## 2021-12-26 DIAGNOSIS — Y838 Other surgical procedures as the cause of abnormal reaction of the patient, or of later complication, without mention of misadventure at the time of the procedure: Secondary | ICD-10-CM | POA: Diagnosis present

## 2021-12-26 DIAGNOSIS — M960 Pseudarthrosis after fusion or arthrodesis: Secondary | ICD-10-CM | POA: Diagnosis present

## 2021-12-26 DIAGNOSIS — S129XXA Fracture of neck, unspecified, initial encounter: Secondary | ICD-10-CM | POA: Diagnosis present

## 2021-12-26 DIAGNOSIS — Z880 Allergy status to penicillin: Secondary | ICD-10-CM

## 2021-12-26 DIAGNOSIS — F418 Other specified anxiety disorders: Secondary | ICD-10-CM

## 2021-12-26 HISTORY — PX: POSTERIOR CERVICAL FUSION/FORAMINOTOMY: SHX5038

## 2021-12-26 LAB — TYPE AND SCREEN
ABO/RH(D): A POS
ABO/RH(D): A POS
Antibody Screen: NEGATIVE
Antibody Screen: NEGATIVE

## 2021-12-26 LAB — POCT I-STAT, CHEM 8
BUN: 16 mg/dL (ref 8–23)
Calcium, Ion: 1.11 mmol/L — ABNORMAL LOW (ref 1.15–1.40)
Chloride: 108 mmol/L (ref 98–111)
Creatinine, Ser: 0.7 mg/dL (ref 0.44–1.00)
Glucose, Bld: 100 mg/dL — ABNORMAL HIGH (ref 70–99)
HCT: 44 % (ref 36.0–46.0)
Hemoglobin: 15 g/dL (ref 12.0–15.0)
Potassium: 4.1 mmol/L (ref 3.5–5.1)
Sodium: 139 mmol/L (ref 135–145)
TCO2: 24 mmol/L (ref 22–32)

## 2021-12-26 SURGERY — POSTERIOR CERVICAL FUSION/FORAMINOTOMY LEVEL 1
Anesthesia: General

## 2021-12-26 MED ORDER — ACETAMINOPHEN 650 MG RE SUPP: 650.0000 mg | RECTAL | Status: DC | PRN

## 2021-12-26 MED ORDER — BACITRACIN ZINC 500 UNIT/GM EX OINT
TOPICAL_OINTMENT | CUTANEOUS | Status: DC | PRN
  Administered 2021-12-26: 1 via TOPICAL

## 2021-12-26 MED ORDER — HYDROMORPHONE HCL 1 MG/ML IJ SOLN
0.2500 mg | INTRAMUSCULAR | Status: DC | PRN
  Administered 2021-12-26 (×2): 0.5 mg via INTRAVENOUS

## 2021-12-26 MED ORDER — FENTANYL CITRATE (PF) 100 MCG/2ML IJ SOLN
INTRAMUSCULAR | Status: AC
  Filled 2021-12-26: qty 2

## 2021-12-26 MED ORDER — FENTANYL CITRATE (PF) 100 MCG/2ML IJ SOLN
25.0000 ug | INTRAMUSCULAR | Status: DC | PRN
  Administered 2021-12-26 (×3): 50 ug via INTRAVENOUS

## 2021-12-26 MED ORDER — VENLAFAXINE HCL ER 75 MG PO CP24
150.0000 mg | ORAL_CAPSULE | Freq: Every day | ORAL | Status: DC
  Administered 2021-12-27: 150 mg via ORAL
  Filled 2021-12-26: qty 2

## 2021-12-26 MED ORDER — CEFAZOLIN SODIUM-DEXTROSE 2-4 GM/100ML-% IV SOLN
INTRAVENOUS | Status: AC
  Filled 2021-12-26: qty 100

## 2021-12-26 MED ORDER — CHLORHEXIDINE GLUCONATE 0.12 % MT SOLN
OROMUCOSAL | Status: AC
  Administered 2021-12-26: 15 mL via OROMUCOSAL
  Filled 2021-12-26: qty 15

## 2021-12-26 MED ORDER — ONDANSETRON HCL 4 MG/2ML IJ SOLN: 4.0000 mg | Freq: Four times a day (QID) | INTRAMUSCULAR | Status: DC | PRN

## 2021-12-26 MED ORDER — CLONAZEPAM 0.5 MG PO TABS: 0.5000 mg | ORAL_TABLET | Freq: Two times a day (BID) | ORAL | Status: DC | PRN

## 2021-12-26 MED ORDER — SUMATRIPTAN SUCCINATE 50 MG PO TABS: 50.0000 mg | ORAL_TABLET | ORAL | Status: DC | PRN

## 2021-12-26 MED ORDER — HYDROCODONE-ACETAMINOPHEN 5-325 MG PO TABS: 1.0000 | ORAL_TABLET | ORAL | Status: DC | PRN

## 2021-12-26 MED ORDER — CHLORTHALIDONE 25 MG PO TABS
50.0000 mg | ORAL_TABLET | Freq: Every day | ORAL | Status: DC
  Administered 2021-12-27: 50 mg via ORAL
  Filled 2021-12-26: qty 2

## 2021-12-26 MED ORDER — ROCURONIUM BROMIDE 10 MG/ML (PF) SYRINGE
PREFILLED_SYRINGE | INTRAVENOUS | Status: AC
  Filled 2021-12-26: qty 10

## 2021-12-26 MED ORDER — SUMATRIPTAN SUCCINATE 100 MG PO TABS: 100.0000 mg | ORAL_TABLET | Freq: Every day | ORAL | Status: DC | PRN

## 2021-12-26 MED ORDER — CEFAZOLIN SODIUM-DEXTROSE 2-4 GM/100ML-% IV SOLN
2.0000 g | INTRAVENOUS | Status: AC
  Administered 2021-12-26: 2 g via INTRAVENOUS

## 2021-12-26 MED ORDER — ROCURONIUM BROMIDE 10 MG/ML (PF) SYRINGE
PREFILLED_SYRINGE | INTRAVENOUS | Status: DC | PRN
  Administered 2021-12-26: 60 mg via INTRAVENOUS

## 2021-12-26 MED ORDER — HYDROMORPHONE HCL 1 MG/ML IJ SOLN: 1.0000 mg | INTRAMUSCULAR | Status: DC | PRN

## 2021-12-26 MED ORDER — VITAMIN B-6 100 MG PO TABS
100.0000 mg | ORAL_TABLET | Freq: Every day | ORAL | Status: DC
  Filled 2021-12-26 (×2): qty 1

## 2021-12-26 MED ORDER — ONDANSETRON HCL 4 MG/2ML IJ SOLN: 4.0000 mg | Freq: Once | INTRAMUSCULAR | Status: DC | PRN

## 2021-12-26 MED ORDER — TRAMADOL HCL 50 MG PO TABS
50.0000 mg | ORAL_TABLET | Freq: Three times a day (TID) | ORAL | Status: DC | PRN
  Administered 2021-12-27: 50 mg via ORAL
  Filled 2021-12-26: qty 1

## 2021-12-26 MED ORDER — SODIUM CHLORIDE 0.9% FLUSH
3.0000 mL | Freq: Two times a day (BID) | INTRAVENOUS | Status: DC
  Administered 2021-12-26: 3 mL via INTRAVENOUS

## 2021-12-26 MED ORDER — ONDANSETRON HCL 4 MG/2ML IJ SOLN
INTRAMUSCULAR | Status: DC | PRN
  Administered 2021-12-26: 4 mg via INTRAVENOUS

## 2021-12-26 MED ORDER — LACTATED RINGERS IV SOLN: INTRAVENOUS | Status: DC

## 2021-12-26 MED ORDER — DEXAMETHASONE SODIUM PHOSPHATE 10 MG/ML IJ SOLN
INTRAMUSCULAR | Status: AC
  Filled 2021-12-26: qty 1

## 2021-12-26 MED ORDER — FLUTICASONE PROPIONATE 50 MCG/ACT NA SUSP: 2.0000 | Freq: Every day | NASAL | Status: DC | PRN

## 2021-12-26 MED ORDER — HYDROMORPHONE HCL 1 MG/ML IJ SOLN
INTRAMUSCULAR | Status: AC
  Filled 2021-12-26: qty 1

## 2021-12-26 MED ORDER — ONDANSETRON HCL 4 MG PO TABS: 4.0000 mg | ORAL_TABLET | Freq: Four times a day (QID) | ORAL | Status: DC | PRN

## 2021-12-26 MED ORDER — BACITRACIN ZINC 500 UNIT/GM EX OINT
TOPICAL_OINTMENT | CUTANEOUS | Status: AC
  Filled 2021-12-26: qty 28.35

## 2021-12-26 MED ORDER — KETOROLAC TROMETHAMINE 30 MG/ML IJ SOLN
INTRAMUSCULAR | Status: AC
  Filled 2021-12-26: qty 1

## 2021-12-26 MED ORDER — CYCLOBENZAPRINE HCL 10 MG PO TABS
10.0000 mg | ORAL_TABLET | Freq: Three times a day (TID) | ORAL | Status: DC | PRN
  Administered 2021-12-26: 10 mg via ORAL
  Filled 2021-12-26: qty 1

## 2021-12-26 MED ORDER — BUPIVACAINE HCL (PF) 0.25 % IJ SOLN
INTRAMUSCULAR | Status: DC | PRN
  Administered 2021-12-26: 20 mL

## 2021-12-26 MED ORDER — KETAMINE HCL-SODIUM CHLORIDE 100-0.9 MG/10ML-% IV SOSY
PREFILLED_SYRINGE | INTRAVENOUS | Status: DC | PRN
  Administered 2021-12-26: 10 mg via INTRAVENOUS
  Administered 2021-12-26 (×2): 20 mg via INTRAVENOUS

## 2021-12-26 MED ORDER — SODIUM CHLORIDE 0.9 % IV SOLN
250.0000 mL | INTRAVENOUS | Status: DC
  Administered 2021-12-26: 250 mL via INTRAVENOUS

## 2021-12-26 MED ORDER — DEXAMETHASONE SODIUM PHOSPHATE 10 MG/ML IJ SOLN
INTRAMUSCULAR | Status: DC | PRN
  Administered 2021-12-26: 10 mg via INTRAVENOUS

## 2021-12-26 MED ORDER — SUGAMMADEX SODIUM 200 MG/2ML IV SOLN
INTRAVENOUS | Status: DC | PRN
  Administered 2021-12-26: 200 mg via INTRAVENOUS

## 2021-12-26 MED ORDER — PHENOL 1.4 % MT LIQD: 1.0000 | OROMUCOSAL | Status: DC | PRN

## 2021-12-26 MED ORDER — SODIUM CHLORIDE 0.9% FLUSH: 3.0000 mL | INTRAVENOUS | Status: DC | PRN

## 2021-12-26 MED ORDER — FENTANYL CITRATE (PF) 250 MCG/5ML IJ SOLN
INTRAMUSCULAR | Status: DC | PRN
  Administered 2021-12-26 (×3): 50 ug via INTRAVENOUS

## 2021-12-26 MED ORDER — ORAL CARE MOUTH RINSE: 15.0000 mL | Freq: Once | OROMUCOSAL | Status: AC

## 2021-12-26 MED ORDER — POTASSIUM CHLORIDE CRYS ER 20 MEQ PO TBCR
20.0000 meq | EXTENDED_RELEASE_TABLET | Freq: Two times a day (BID) | ORAL | Status: DC
  Administered 2021-12-26 – 2021-12-27 (×2): 20 meq via ORAL
  Filled 2021-12-26 (×2): qty 1

## 2021-12-26 MED ORDER — MENTHOL 3 MG MT LOZG: 1.0000 | LOZENGE | OROMUCOSAL | Status: DC | PRN

## 2021-12-26 MED ORDER — CHLORHEXIDINE GLUCONATE 0.12 % MT SOLN: 15.0000 mL | Freq: Once | OROMUCOSAL | Status: AC

## 2021-12-26 MED ORDER — ACETAMINOPHEN 325 MG PO TABS: 650.0000 mg | ORAL_TABLET | ORAL | Status: DC | PRN

## 2021-12-26 MED ORDER — CEFAZOLIN SODIUM-DEXTROSE 1-4 GM/50ML-% IV SOLN
1.0000 g | Freq: Three times a day (TID) | INTRAVENOUS | Status: AC
  Administered 2021-12-26 – 2021-12-27 (×2): 1 g via INTRAVENOUS
  Filled 2021-12-26 (×2): qty 50

## 2021-12-26 MED ORDER — THROMBIN 5000 UNITS EX SOLR
CUTANEOUS | Status: DC | PRN
  Administered 2021-12-26: 10000 [IU] via TOPICAL

## 2021-12-26 MED ORDER — FENTANYL CITRATE (PF) 250 MCG/5ML IJ SOLN
INTRAMUSCULAR | Status: AC
  Filled 2021-12-26: qty 5

## 2021-12-26 MED ORDER — LORATADINE 10 MG PO TABS
10.0000 mg | ORAL_TABLET | Freq: Every day | ORAL | Status: DC
  Administered 2021-12-26 – 2021-12-27 (×2): 10 mg via ORAL
  Filled 2021-12-26 (×2): qty 1

## 2021-12-26 MED ORDER — PROPOFOL 10 MG/ML IV BOLUS
INTRAVENOUS | Status: DC | PRN
  Administered 2021-12-26: 20 mg via INTRAVENOUS
  Administered 2021-12-26: 180 mg via INTRAVENOUS

## 2021-12-26 MED ORDER — THROMBIN 5000 UNITS EX SOLR
CUTANEOUS | Status: AC
  Filled 2021-12-26: qty 10000

## 2021-12-26 MED ORDER — BUPIVACAINE HCL (PF) 0.25 % IJ SOLN
INTRAMUSCULAR | Status: AC
  Filled 2021-12-26: qty 30

## 2021-12-26 MED ORDER — KETAMINE HCL 50 MG/5ML IJ SOSY
PREFILLED_SYRINGE | INTRAMUSCULAR | Status: AC
  Filled 2021-12-26: qty 5

## 2021-12-26 MED ORDER — VITAMIN D 25 MCG (1000 UNIT) PO TABS
5000.0000 [IU] | ORAL_TABLET | Freq: Every day | ORAL | Status: DC
  Administered 2021-12-26: 5000 [IU] via ORAL
  Filled 2021-12-26: qty 5

## 2021-12-26 MED ORDER — HYDROCODONE-ACETAMINOPHEN 10-325 MG PO TABS
2.0000 | ORAL_TABLET | ORAL | Status: DC | PRN
  Administered 2021-12-26 – 2021-12-27 (×4): 2 via ORAL
  Filled 2021-12-26 (×4): qty 2

## 2021-12-26 MED ORDER — LIDOCAINE 2% (20 MG/ML) 5 ML SYRINGE
INTRAMUSCULAR | Status: AC
  Filled 2021-12-26: qty 5

## 2021-12-26 MED ORDER — PHENYLEPHRINE HCL-NACL 20-0.9 MG/250ML-% IV SOLN
INTRAVENOUS | Status: AC
  Filled 2021-12-26: qty 250

## 2021-12-26 MED ORDER — ACETAMINOPHEN 500 MG PO TABS
1000.0000 mg | ORAL_TABLET | Freq: Once | ORAL | Status: AC
  Administered 2021-12-26: 1000 mg via ORAL
  Filled 2021-12-26: qty 2

## 2021-12-26 MED ORDER — ONDANSETRON HCL 4 MG/2ML IJ SOLN
INTRAMUSCULAR | Status: AC
  Filled 2021-12-26: qty 2

## 2021-12-26 MED ORDER — LIDOCAINE 2% (20 MG/ML) 5 ML SYRINGE
INTRAMUSCULAR | Status: DC | PRN
  Administered 2021-12-26: 80 mg via INTRAVENOUS

## 2021-12-26 MED ORDER — 0.9 % SODIUM CHLORIDE (POUR BTL) OPTIME
TOPICAL | Status: DC | PRN
  Administered 2021-12-26: 1000 mL

## 2021-12-26 SURGICAL SUPPLY — 58 items
ADH SKN CLS APL DERMABOND .7 (GAUZE/BANDAGES/DRESSINGS) ×1
APL SKNCLS STERI-STRIP NONHPOA (GAUZE/BANDAGES/DRESSINGS) ×1
BAG COUNTER SPONGE SURGICOUNT (BAG) ×2 IMPLANT
BAG DECANTER FOR FLEXI CONT (MISCELLANEOUS) ×2 IMPLANT
BAG SPNG CNTER NS LX DISP (BAG) ×1
BENZOIN TINCTURE PRP APPL 2/3 (GAUZE/BANDAGES/DRESSINGS) ×4 IMPLANT
BLADE CLIPPER SURG (BLADE) IMPLANT
BUR MATCHSTICK NEURO 3.0 LAGG (BURR) ×2 IMPLANT
CANISTER SUCT 3000ML PPV (MISCELLANEOUS) ×2 IMPLANT
DERMABOND ADVANCED .7 DNX12 (GAUZE/BANDAGES/DRESSINGS) ×2 IMPLANT
DRAPE C-ARM 42X72 X-RAY (DRAPES) ×4 IMPLANT
DRAPE LAPAROTOMY 100X72 PEDS (DRAPES) ×2 IMPLANT
DRSG OPSITE POSTOP 4X6 (GAUZE/BANDAGES/DRESSINGS) ×2 IMPLANT
DURAPREP 26ML APPLICATOR (WOUND CARE) ×2 IMPLANT
ELECT REM PT RETURN 9FT ADLT (ELECTROSURGICAL) ×1
ELECTRODE REM PT RTRN 9FT ADLT (ELECTROSURGICAL) ×2 IMPLANT
EVACUATOR 1/8 PVC DRAIN (DRAIN) IMPLANT
GAUZE 4X4 16PLY ~~LOC~~+RFID DBL (SPONGE) IMPLANT
GAUZE SPONGE 4X4 12PLY STRL (GAUZE/BANDAGES/DRESSINGS) ×2 IMPLANT
GLOVE BIO SURGEON STRL SZ 6.5 (GLOVE) ×2 IMPLANT
GLOVE BIOGEL PI IND STRL 6.5 (GLOVE) ×2 IMPLANT
GLOVE ECLIPSE 9.0 STRL (GLOVE) ×2 IMPLANT
GOWN STRL REUS W/ TWL LRG LVL3 (GOWN DISPOSABLE) IMPLANT
GOWN STRL REUS W/ TWL XL LVL3 (GOWN DISPOSABLE) ×2 IMPLANT
GOWN STRL REUS W/TWL 2XL LVL3 (GOWN DISPOSABLE) IMPLANT
GOWN STRL REUS W/TWL LRG LVL3 (GOWN DISPOSABLE)
GOWN STRL REUS W/TWL XL LVL3 (GOWN DISPOSABLE) ×1
GRAFT BN 5X1XSPNE CVD POST DBM (Bone Implant) IMPLANT
GRAFT BONE MAGNIFUSE 1X5CM (Bone Implant) ×1 IMPLANT
KIT BASIN OR (CUSTOM PROCEDURE TRAY) ×2 IMPLANT
KIT INFUSE XX SMALL 0.7CC (Orthopedic Implant) IMPLANT
KIT TURNOVER KIT B (KITS) ×2 IMPLANT
NDL SPNL 22GX3.5 QUINCKE BK (NEEDLE) ×2 IMPLANT
NEEDLE HYPO 22GX1.5 SAFETY (NEEDLE) ×2 IMPLANT
NEEDLE SPNL 22GX3.5 QUINCKE BK (NEEDLE) ×1 IMPLANT
NS IRRIG 1000ML POUR BTL (IV SOLUTION) ×2 IMPLANT
PACK LAMINECTOMY NEURO (CUSTOM PROCEDURE TRAY) ×2 IMPLANT
PAD ARMBOARD 7.5X6 YLW CONV (MISCELLANEOUS) ×6 IMPLANT
PIN MAYFIELD SKULL DISP (PIN) ×2 IMPLANT
PUTTY GRAFTON DBF 6CC W/DELIVE (Putty) IMPLANT
RELOAD STAPLE 45 BLU REG DVNC (STAPLE) IMPLANT
ROD PRE CUT 3.5X25 (Rod) IMPLANT
SCREW MULTI AXIAL 3.5X14MM (Screw) IMPLANT
SET SCREW INFINITY IFIX THOR (Screw) IMPLANT
SPONGE SURGIFOAM ABS GEL SZ50 (HEMOSTASIS) ×2 IMPLANT
SPONGE T-LAP 4X18 ~~LOC~~+RFID (SPONGE) IMPLANT
STAPLER 45 BLU RELOAD XI (STAPLE) IMPLANT
STAPLER 45 BLUE RELOAD XI (STAPLE)
STAPLER VISISTAT 35W (STAPLE) IMPLANT
STRIP CLOSURE SKIN 1/2X4 (GAUZE/BANDAGES/DRESSINGS) ×2 IMPLANT
SUT VIC AB 0 CT1 18XCR BRD8 (SUTURE) ×2 IMPLANT
SUT VIC AB 0 CT1 8-18 (SUTURE) ×1
SUT VIC AB 2-0 CT1 18 (SUTURE) ×2 IMPLANT
SUT VIC AB 3-0 SH 8-18 (SUTURE) ×2 IMPLANT
TAPE CLOTH 4X10 WHT NS (GAUZE/BANDAGES/DRESSINGS) ×2 IMPLANT
TOWEL GREEN STERILE (TOWEL DISPOSABLE) ×2 IMPLANT
TOWEL GREEN STERILE FF (TOWEL DISPOSABLE) ×2 IMPLANT
WATER STERILE IRR 1000ML POUR (IV SOLUTION) ×2 IMPLANT

## 2021-12-26 NOTE — Progress Notes (Signed)
Orthopedic Tech Progress Note Patient Details:  Carol [name] [birth year] [id number]  Ortho Devices Type of Ortho Device: Soft collar Ortho Device/Splint Location: NECK Ortho Device/Splint Interventions: Ordered, Application, Adjustment   Post Interventions Patient Tolerated: Well Instructions Provided: Care of device  Janit Pagan 12/26/2021, 1:09 PM

## 2021-12-26 NOTE — Anesthesia Procedure Notes (Signed)
Procedure Name: Intubation Date/Time: 12/26/2021 11:07 AM  Performed by: Lowella Dell, CRNAPre-anesthesia Checklist: Patient identified, Emergency Drugs available, Suction available and Patient being monitored Patient Re-evaluated:Patient Re-evaluated prior to induction Oxygen Delivery Method: Circle System Utilized Preoxygenation: Pre-oxygenation with 100% oxygen Induction Type: IV induction Ventilation: Mask ventilation without difficulty Laryngoscope Size: Glidescope and 3 (elective glidescope d/t multiple previous neck surgeries) Grade View: Grade I Tube type: Oral Tube size: 7.0 mm Number of attempts: 1 Airway Equipment and Method: Stylet and Oral airway Placement Confirmation: ETT inserted through vocal cords under direct vision, positive ETCO2 and breath sounds checked- equal and bilateral Secured at: 21 cm Tube secured with: Tape Dental Injury: Teeth and Oropharynx as per pre-operative assessment

## 2021-12-26 NOTE — Anesthesia Postprocedure Evaluation (Signed)
Anesthesia Post Note  Patient: Carol [name]  Procedure(s) Performed: Posterior cervical fusion with lateral mass fixation - C5-C6     Patient location during evaluation: PACU Anesthesia Type: General Level of consciousness: awake and alert Pain management: pain level controlled Vital Signs Assessment: post-procedure vital signs reviewed and stable Respiratory status: spontaneous breathing, nonlabored ventilation and respiratory function stable Cardiovascular status: stable and blood pressure returned to baseline Anesthetic complications: no   No notable events documented.  Last Vitals:  Vitals:   12/26/21 1350 12/26/21 1405  BP: (!) 128/94 130/75  Pulse: 83 83  Resp: 12 13  Temp:  (!) 36.4 C  SpO2: 96% 93%    Last Pain:  Vitals:   12/26/21 1405  TempSrc:   PainSc: [place]

## 2021-12-26 NOTE — Anesthesia Preprocedure Evaluation (Addendum)
Anesthesia Evaluation  Patient identified by MRN, date of birth, ID band Patient awake    Reviewed: Allergy & Precautions, NPO status , Patient's Chart, lab work & pertinent test results  History of Anesthesia Complications Negative for: history of anesthetic complications  Airway Mallampati: II  TM Distance: >3 FB Neck ROM: Limited    Dental  (+) Dental Advisory Given, Partial Lower   Pulmonary former smoker,    Pulmonary exam normal        Cardiovascular hypertension, Pt. on medications Normal cardiovascular exam     Neuro/Psych  Headaches, PSYCHIATRIC DISORDERS Anxiety Depression    GI/Hepatic negative GI ROS, (+)     substance abuse  marijuana use,   Endo/Other   Obesity Hypokalemia, resolved    Renal/GU negative Renal ROS     Musculoskeletal  (+) Arthritis , Fibromyalgia -  Abdominal   Peds  Hematology negative hematology ROS (+)   Anesthesia Other Findings   Reproductive/Obstetrics                           Anesthesia Physical Anesthesia Plan  ASA: 3  Anesthesia Plan: General   Post-op Pain Management: Tylenol PO (pre-op)* and Ketamine IV*   Induction: Intravenous  PONV Risk Score and Plan: 3 and Treatment may vary due to age or medical condition, Ondansetron and Dexamethasone  Airway Management Planned: Oral ETT and Video Laryngoscope Planned  Additional Equipment: None  Intra-op Plan:   Post-operative Plan: Extubation in OR  Informed Consent: I have reviewed the patients History and Physical, chart, labs and discussed the procedure including the risks, benefits and alternatives for the proposed anesthesia with the patient or authorized representative who has indicated his/her understanding and acceptance.     Dental advisory given  Plan Discussed with: CRNA and Anesthesiologist  Anesthesia Plan Comments:       Anesthesia Quick Evaluation

## 2021-12-26 NOTE — Op Note (Signed)
Date of procedure: 12/26/2021  Date of dictation: Same  Service: Neurosurgery  Preoperative diagnosis: C5-6 pseudoarthrosis  Postoperative diagnosis: Same  Procedure Name: C5-6 posterior cervical fusion utilizing local autograft, morselized autograft, bone neurogenic protein, and nonsegmental lateral mass instrumentation  Surgeon:Jawanda Passey A.Meleni Delahunt, M.D.  Asst. Surgeon: Reinaldo Meeker, NP  Anesthesia: General  Indication: 76 year old female remotely status post C4-5 and 6 anterior cervical decompression and fusion presents with worsening neck pain and evidence of obvious pseudoarthrosis at C5-6 with instability.  Patient presents now for posterior cervical fusion in hopes of improving her symptoms. Operative note: After induction of anesthesia, patient position prone onto bolsters with her head fixed in a neutral position using a [place] pin headrest.  Patient's posterior cervical region prepped and draped sterilely.  Incision made overlying C5-6.  Dissection performed bilaterally.  Retractor placed.  Fluoroscopy used.  Level confirmed.  Entry sites into the lateral masses of C5 and C6 were identified.  Pilot holes were drilled.  Pilot holes were probed and found to be solidly within the bone.  A 14 mm Medtronic vertex screws were then placed bilaterally at C5 and C6.  Fluoroscopy confirmed good position of the hardware at the proper operative level with normal alignment of the spine.  Lateral masses and facet joints were decorticated using high-speed drill.  Autograft was collected and reused.  Short segment titanium rod placed over the screw heads at C5 and C6.  Locking caps then placed over the screws.  Locking caps and given a final tightening with this construct under mild compression.  Small bone morphogenic protein soaked sponges were then placed over the lamina and facet joints of C5 and C6.  Magnifuse allograft packets were placed over the lamina and facet joints of C5 and C6.  Wound was then closed  in layers with Vicryl sutures.  Steri-Strips and sterile dressing were applied.  No apparent complications.  Patient tolerated the procedure well and she returns to the recovery room postop.\

## 2021-12-26 NOTE — H&P (Signed)
Carol [name] is an 76 y.o. female.   Chief Complaint: Neck pain HPI: 76 year old female status post remote C4-5 and C5-6 anterior cervical discectomy and fusion and more recent C6-7 anterior cervical discectomy and fusion.  The patient has developed a symptomatic pseudoarthrosis with evidence of instability on x-rays at the C5-6 level.  She presents now for posterior cervical fusion after failing conservative management.  She has some neck pain but denies radiating pain numbness or weakness in her extremities.  She has no lower extremity dysfunction.  She has no bowel or bladder dysfunction.  Past Medical History:  Diagnosis Date   Anxiety    Arthritis    COVID-19 11/2020   symptom free since 12/08/20   Depression    Headache    migraine - last one 2016   Hypertension    reason that she was given [place] & she remarks that her BP has been better she has been taking it.    Multilevel degenerative disc disease    Positive skin test for tuberculosis    told that she is a carrier, (treated age 5 and as adult) Negative x-ray(per pt)   Wears contact lenses    Wears dentures    partial upper   Wears hearing aid in right ear     Past Surgical History:  Procedure Laterality Date   ABDOMINAL HYSTERECTOMY     ANKLE ARTHROSCOPY Left 10/30/2019   Procedure: A-SCOPE/DEBRIDEMENT, EXTENSIVE, LEFT ANKLE;  Surgeon: Gwyneth Revels, DPM;  Location: [facility] ORS;  Service: Podiatry;  Laterality: Left;   ANKLE RECONSTRUCTION Left 10/30/2019   Procedure: BROSTRUM-GOULD LEFT ANKLE;  Surgeon: Gwyneth Revels, DPM;  Location: [facility] OR[facility];  Service: Podiatry;  Laterality: Left;   BACK SURGERY     x3 previous cervical fusion & then removed hardware    BREAST SURGERY Right    benign-    CATARACT EXTRACTION W/PHACO Left 01/03/2021   Procedure: CATARACT EXTRACTION PHACO AND INTRAOCULAR LENS PLACEMENT (IOC) LEFT;  Surgeon: Nevada Crane, MD;  Location: [facility];  Service: Ophthalmology;  Laterality:  Left;  7.32 0:46.3   CATARACT EXTRACTION W/PHACO Right 01/17/2021   Procedure: CATARACT EXTRACTION PHACO AND INTRAOCULAR LENS PLACEMENT (IOC) RIGHT;  Surgeon: Nevada Crane, MD;  Location: [facility];  Service: Ophthalmology;  Laterality: Right;  5.09 0:32.9   HAND TENDON SURGERY Right 2016   JOINT REPLACEMENT Right    also had a partial on the L knee   LUMBAR FUSION     MYRINGOTOMY WITH TUBE PLACEMENT Right    TONSILLECTOMY     TUBAL LIGATION     had a 2nd. surgery for adhesions    Family History  Problem Relation Age of Onset   Hypertension Mother    Hyperlipidemia Mother    Heart disease Mother    Aneurysm Mother    Hyperlipidemia Brother    Hypertension Brother    Lung cancer Brother    Social History:  reports that she quit smoking about 45 years ago. Her smoking use included cigarettes. She has never used smokeless tobacco. She reports current drug use. Frequency: 1.00 time per week. Drug: Marijuana. She reports that she does not drink alcohol.  Allergies:  Allergies  Allergen Reactions   Gentamicin Rash, Itching and Other (See Comments)   Celecoxib Other (See Comments)    Achy joints   Hydralazine Nausea And Vomiting   Ibandronic Acid Other (See Comments)    cracked teeth and joint pain Boniva   Oxycodone Hcl  Itching    Can take with benadryl   Penicillins Hives and Other (See Comments)    Has patient had a PCN reaction causing immediate rash, facial/tongue/throat swelling, SOB or lightheadedness with hypotension: No Has patient had a PCN reaction causing severe rash involving mucus membranes or skin necrosis: Yes Has patient had a PCN reaction that required hospitalization No Has patient had a PCN reaction occurring within the last 10 years: No If all of the above answers are "NO", then may proceed with Cephalosporin use.     Medications Prior to Admission  Medication Sig Dispense Refill   Calcium Carbonate-Vitamin D (CALTRATE 600+D PO) Take 1  tablet by mouth daily.      cetirizine (ZYRTEC) 10 MG tablet Take 10 mg by mouth daily.     chlorthalidone (HYGROTON) 50 MG tablet Take 50 mg by mouth daily.     clonazePAM (KLONOPIN) 0.5 MG tablet Take 0.5 mg by mouth daily as needed for anxiety.      diclofenac Sodium (VOLTAREN) 1 % GEL Apply 2 g topically daily as needed (Pain).     Docusate Calcium (STOOL SOFTENER PO) Take 20 mg by mouth daily. [facility]     fluticasone (FLONASE) 50 MCG/ACT nasal spray Place 2 sprays into both nostrils daily as needed for allergies.      KLOR-CON M20 20 MEQ tablet Take 20 mEq by mouth 2 (two) times daily.     pyridOXINE (VITAMIN B-6) 100 MG tablet Take 100 mg by mouth daily.     rizatriptan (MAXALT) 10 MG tablet Take 10 mg by mouth daily as needed for migraine.     SUMAtriptan (IMITREX) 100 MG tablet Take 1 tablet (100 mg total) by mouth daily as needed for migraine. May repeat in 2 hours if headache persists or recurs. 10 tablet 2   traMADol (ULTRAM) 50 MG tablet Take 1-2 tablets (50-100 mg total) by mouth every 8 (eight) hours as needed. 180 tablet 0   venlafaxine XR (EFFEXOR-XR) 150 MG 24 hr capsule Take 150 mg by mouth daily.     VITAMIN D, CHOLECALCIFEROL, PO Take 5,000 Units by mouth daily.     traZODone (DESYREL) 50 MG tablet Take 50 mg by mouth at bedtime as needed for sleep.       Results for orders placed or performed during the hospital encounter of 12/26/21 (from the past 48 hour(s))  Type and screen [place]     Status: None (Preliminary result)   Collection Time: 12/26/21  9:40 AM  Result Value Ref Range   ABO/RH(D) PENDING    Antibody Screen PENDING    Sample Expiration      12/29/2021,2359 Performed at [facility], [place] [address], [place], [place] [address]   I-STAT, [place] [address]     Status: Abnormal   Collection Time: 12/26/21  9:50 AM  Result Value Ref Range   Sodium 139 135 - 145 mmol/L   Potassium 4.1 3.5 - 5.1 mmol/L   Chloride 108 98 - 111 mmol/L   BUN 16 8 -  23 mg/dL   Creatinine, Ser 0.70 0.44 - 1.00 mg/dL   Glucose, Bld 100 (H) 70 - 99 mg/dL    Comment: Glucose reference range applies only to samples taken after fasting for at least 8 hours.   Calcium, Ion 1.11 (L) 1.15 - 1.40 mmol/L   TCO2 24 22 - 32 mmol/L   Hemoglobin 15.0 12.0 - 15.0 g/dL   HCT 44.0 36.0 - 46.0 %  No results found.  Pertinent items noted in HPI and remainder of comprehensive ROS otherwise negative.  Blood pressure 113/61, pulse 74, temperature 97.7 F (36.5 C), temperature source Oral, resp. rate 18, height 5\' 3"  (1.6 m), weight 83.5 kg, SpO2 99 %.  Patient is awake and alert.  She is oriented and appropriate.  Speech is fluent.  Judgment insight are intact.  Cranial nerve function normal bilaterally motor examination reveals intact motor strength bilateral.  Sensory examination with intact sensation bilaterally.  Reflexes normal active.  No evidence of long track signs.  Gait normal peer examination head ears eyes nose and throat is unremarked.  Chest and abdomen are benign.  Extremities are free from injury or deformity. Assessment/Plan C5-6 symptomatic pseudoarthrosis.  Plan C5-6 posterior cervical fusion utilizing local autograft, morselized allograft, bone neurogenic protein, and nonsegmental lateral mass instrumentation.  Risks and benefits of been explained.  Patient wishes to proceed.  Asencion Loveday A Felina Tello 12/26/2021, 10:30 AM

## 2021-12-26 NOTE — Transfer of Care (Signed)
Immediate Anesthesia Transfer of Care Note  Patient: Carol [name]  Procedure(s) Performed: Posterior cervical fusion with lateral mass fixation - C5-C6  Patient Location: PACU  Anesthesia Type:General  Level of Consciousness: awake, alert , and patient cooperative  Airway & Oxygen Therapy: Patient Spontanous Breathing and Patient connected to face mask oxygen  Post-op Assessment: Report given to RN, Post -op Vital signs reviewed and stable, and Patient moving all extremities X 4  Post vital signs: Reviewed and stable  Last Vitals:  Vitals Value Taken Time  BP 128/80 12/26/21 1232  Temp    Pulse 86 12/26/21 1236  Resp 16 12/26/21 1236  SpO2 100 % 12/26/21 1236  Vitals shown include unvalidated device data.  Last Pain:  Vitals:   12/26/21 0938  TempSrc: Oral  PainSc:          Complications: No notable events documented.

## 2021-12-26 NOTE — Brief Op Note (Signed)
12/26/2021  12:13 PM  PATIENT:  Carol [name]  76 y.o. female  PRE-OPERATIVE DIAGNOSIS:  Pseudoarthrosis  POST-OPERATIVE DIAGNOSIS:  * No post-op diagnosis entered *  PROCEDURE:  Procedure(s): Posterior cervical fusion with lateral mass fixation - C5-C6 (N/A)  SURGEON:  Surgeon(s) and Role:    Earnie Larsson, MD - Primary  PHYSICIAN ASSISTANT:   ASSISTANTSMearl Latin   ANESTHESIA:   general  EBL:  75 mL   BLOOD ADMINISTERED:none  DRAINS: none   LOCAL MEDICATIONS USED:  MARCAINE     SPECIMEN:  No Specimen  DISPOSITION OF SPECIMEN:  N/A  COUNTS:  YES  TOURNIQUET:  * No tourniquets in log *  DICTATION: .Dragon Dictation  PLAN OF CARE: Admit to inpatient   PATIENT DISPOSITION:  PACU - hemodynamically stable.   Delay start of Pharmacological VTE agent (>24hrs) due to surgical blood loss or risk of bleeding: yes

## 2021-12-27 MED ORDER — HYDROCODONE-ACETAMINOPHEN 10-325 MG PO TABS: 1.0000 | ORAL_TABLET | ORAL | 0 refills | Status: DC | PRN

## 2021-12-27 MED ORDER — CYCLOBENZAPRINE HCL 10 MG PO TABS: 10.0000 mg | ORAL_TABLET | Freq: Three times a day (TID) | ORAL | 0 refills | Status: DC | PRN

## 2021-12-27 MED ORDER — DIPHENHYDRAMINE HCL 25 MG PO CAPS
25.0000 mg | ORAL_CAPSULE | Freq: Four times a day (QID) | ORAL | Status: DC | PRN
  Administered 2021-12-27: 25 mg via ORAL
  Filled 2021-12-27 (×3): qty 1

## 2021-12-27 NOTE — Evaluation (Signed)
Occupational Therapy Evaluation Patient Details Name: Carol [name] MRN: [id number] DOB: Jun 25, 1945 Today's Date: 12/27/2021   History of Present Illness CLIDE ZENGEL  76 y.o. female who underwent posterior cervical fusion with lateral mass fixation - C5-C6 10/9. PMHx: cervical sx x3, anxiety, arthritis, HTN, HOH   Clinical Impression   Carol [name] was evaluated s/p the above cervical surgery, she is mod I at baseline with intermittent use of SPC. Upon evaluation she required generalized supervision for ADLs and mobility with cues and education for compensatory techniques to maintain cervical precautions. She does not require further OT acutely. Recommend d/c to home with support of family.      Recommendations for follow up therapy are one component of a multi-disciplinary discharge planning process, led by the attending physician.  Recommendations may be updated based on patient status, additional functional criteria and insurance authorization.   Follow Up Recommendations  No OT follow up    Assistance Recommended at Discharge Intermittent Supervision/Assistance  Patient can return home with the following A little help with walking and/or transfers;A little help with bathing/dressing/bathroom;Assist for transportation    Functional Status Assessment  Patient has had a recent decline in their functional status and demonstrates the ability to make significant improvements in function in a reasonable and predictable amount of time.  Equipment Recommendations  None recommended by OT       Precautions / Restrictions Precautions Precautions: Fall;Cervical Precaution Booklet Issued: Yes (comment) Required Braces or Orthoses: Cervical Brace Cervical Brace: Soft collar Restrictions Weight Bearing Restrictions: No      Mobility Bed Mobility Overal bed mobility: Modified Independent             General bed mobility comments: with log roll    Transfers Overall transfer level:  Needs assistance Equipment used: None Transfers: Sit to/from Stand Sit to Stand: Supervision                  Balance Overall balance assessment: No apparent balance deficits (not formally assessed)                     ADL either performed or assessed with clinical judgement   ADL Overall ADL's : Needs assistance/impaired Eating/Feeding: Independent;Sitting   Grooming: Supervision/safety;Cueing for compensatory techniques   Upper Body Bathing: Supervision/ safety;Cueing for compensatory techniques   Lower Body Bathing: Supervison/ safety;Cueing for compensatory techniques   Upper Body Dressing : Supervision/safety;Cueing for compensatory techniques   Lower Body Dressing: Supervision/safety;Cueing for compensatory techniques   Toilet Transfer: Supervision/safety   Toileting- Clothing Manipulation and Hygiene: Supervision/safety;Cueing for compensatory techniques       Functional mobility during ADLs: Supervision/safety General ADL Comments: generalized supervision for safety and cues to maintain cervica precautions with compensatory techniques     Vision Baseline Vision/History: 0 No visual deficits Patient Visual Report: No change from baseline Vision Assessment?: No apparent visual deficits            Pertinent Vitals/Pain Pain Assessment Pain Assessment: Faces Faces Pain Scale: Hurts a little bit Pain Location: sx site Pain Descriptors / Indicators: Discomfort Pain Intervention(s): Limited activity within patient's tolerance, Monitored during session     Hand Dominance Right   Extremity/Trunk Assessment Upper Extremity Assessment Upper Extremity Assessment: Overall WFL for tasks assessed   Lower Extremity Assessment Lower Extremity Assessment: Generalized weakness   Cervical / Trunk Assessment Cervical / Trunk Assessment: Neck Surgery   Communication Communication Communication: No difficulties   Cognition Arousal/Alertness:  Awake/alert Behavior During Therapy:  WFL for tasks assessed/performed Overall Cognitive Status: Within Functional Limits for tasks assessed                             General Comments  VSS on RA     Home Living Family/patient expects to be discharged to:: Private residence Living Arrangements: Spouse/significant other Available Help at Discharge: Available 24 hours/day Type of Home: House Home Access: Stairs to enter [employer] of Steps: 5 Entrance Stairs-Rails: Right;Left Home Layout: One level     Bathroom Shower/Tub: Occupational psychologist: Standard     Home Equipment: Conservation officer, nature (2 wheels);Cane - single point;Grab bars - tub/shower;Shower seat - built in          Prior Functioning/Environment Prior Level of Function : Independent/Modified Independent;Driving             Mobility Comments: SPC intermittently ADLs Comments: indep        OT Problem List: Decreased knowledge of precautions;Decreased activity tolerance      OT Treatment/Interventions:      OT Goals(Current goals can be found in the care plan section) Acute Rehab OT Goals Patient Stated Goal: home OT Goal Formulation: With patient Time For Goal Achievement: 12/27/21 Potential to Achieve Goals: Good   AM-PAC OT "6 Clicks" Daily Activity     Outcome Measure Help from another person eating meals?: None Help from another person taking care of personal grooming?: A Little Help from another person toileting, which includes using toliet, bedpan, or urinal?: A Little Help from another person bathing (including washing, rinsing, drying)?: A Little Help from another person to put on and taking off regular upper body clothing?: A Little Help from another person to put on and taking off regular lower body clothing?: A Little 6 Click Score: 19   End of Session Equipment Utilized During Treatment: Cervical collar Nurse Communication: Mobility status  Activity  Tolerance: Patient tolerated treatment well Patient left: in bed  OT Visit Diagnosis: Unsteadiness on feet (R26.81);Pain                Time: 6010-9323 OT Time Calculation (min): 21 min Charges:  OT General Charges $OT Visit: 1 Visit OT Evaluation $OT Eval Low Complexity: 1 Low  Carol [name] 12/27/2021, 9:13 AM

## 2021-12-27 NOTE — Plan of Care (Signed)

## 2021-12-27 NOTE — Discharge Instructions (Signed)

## 2021-12-27 NOTE — Progress Notes (Signed)
Patient alert and oriented , surgical site clean and dry no sign of infection patient void. D/c instruction explain and given to the patient . All questions answered. Pt. D/c home per order.

## 2021-12-27 NOTE — Discharge Summary (Signed)
Physician Discharge Summary  Patient ID: Carol [name] MRN: [id number] DOB/AGE: [birth year] 76 y.o.  Admit date: 12/26/2021 Discharge date: 12/27/2021  Admission Diagnoses:  Discharge Diagnoses:  Principal Problem:   Cervical pseudoarthrosis [facility])   Discharged Condition: good  Hospital Course: Patient underwent uncomplicated Q7-6 posterior cervical fusion with instrumentation for treatment of her symptomatic pseudoarthrosis.  Postoperatively doing well.  Neck pain well controlled.  Standing ambulating and voiding without difficulty.  No motor or sensory complaints.  Ready for discharge home.  Consults:   Significant Diagnostic Studies:   Treatments:   Discharge Exam: Blood pressure 133/65, pulse 86, temperature 98.1 F (36.7 C), temperature source Oral, resp. rate 16, height 5\' 3"  (1.6 m), weight 83.5 kg, SpO2 97 %. Awake and alert.  Oriented and appropriate.  Motor and sensory function intact.  Wound clean and dry.  Chest and abdomen benign.  Disposition: Discharge disposition: 01-Home or Self Care        Allergies as of 12/27/2021       Reactions   Gentamicin Rash, Itching, Other (See Comments)   Celecoxib Other (See Comments)   Achy joints   Hydralazine Nausea And Vomiting   Ibandronic Acid Other (See Comments)   cracked teeth and joint pain Boniva   Oxycodone Hcl Itching   Can take with benadryl   Penicillins Hives, Other (See Comments)   Has patient had a PCN reaction causing immediate rash, facial/tongue/throat swelling, SOB or lightheadedness with hypotension: No Has patient had a PCN reaction causing severe rash involving mucus membranes or skin necrosis: Yes Has patient had a PCN reaction that required hospitalization No Has patient had a PCN reaction occurring within the last 10 years: No If all of the above answers are "NO", then may proceed with Cephalosporin use.        Medication List     TAKE these medications    CALTRATE 600+D PO Take 1  tablet by mouth daily.   cetirizine 10 MG tablet Commonly known as: ZYRTEC Take 10 mg by mouth daily.   chlorthalidone 50 MG tablet Commonly known as: HYGROTON Take 50 mg by mouth daily.   clonazePAM 0.5 MG tablet Commonly known as: KLONOPIN Take 0.5 mg by mouth daily as needed for anxiety.   cyclobenzaprine 10 MG tablet Commonly known as: FLEXERIL Take 1 tablet (10 mg total) by mouth 3 (three) times daily as needed for muscle spasms.   diclofenac Sodium 1 % Gel Commonly known as: VOLTAREN Apply 2 g topically daily as needed (Pain).   fluticasone 50 MCG/ACT nasal spray Commonly known as: FLONASE Place 2 sprays into both nostrils daily as needed for allergies.   HYDROcodone-acetaminophen 10-325 MG tablet Commonly known as: NORCO Take 1-2 tablets by mouth every 4 (four) hours as needed for severe pain ((score 7 to 10)).   Klor-Con M20 20 MEQ tablet Generic drug: potassium chloride SA Take 20 mEq by mouth 2 (two) times daily.   pyridOXINE 100 MG tablet Commonly known as: VITAMIN B6 Take 100 mg by mouth daily.   rizatriptan 10 MG tablet Commonly known as: MAXALT Take 10 mg by mouth daily as needed for migraine.   STOOL SOFTENER PO Take 20 mg by mouth daily. [facility]   SUMAtriptan 100 MG tablet Commonly known as: IMITREX Take 1 tablet (100 mg total) by mouth daily as needed for migraine. May repeat in 2 hours if headache persists or recurs.   traMADol 50 MG tablet Commonly known as: ULTRAM Take 1-2 tablets (50-100 mg  total) by mouth every 8 (eight) hours as needed.   traZODone 50 MG tablet Commonly known as: DESYREL Take 50 mg by mouth at bedtime as needed for sleep.   venlafaxine XR 150 MG 24 hr capsule Commonly known as: EFFEXOR-XR Take 150 mg by mouth daily.   VITAMIN D (CHOLECALCIFEROL) PO Take 5,000 Units by mouth daily.         Signed: [name] 12/27/2021, 9:57 AM

## 2021-12-29 ENCOUNTER — Encounter (HOSPITAL_COMMUNITY): Payer: Self-pay | Admitting: Neurosurgery

## 2022-01-02 ENCOUNTER — Emergency Department: Payer: Medicare Other

## 2022-01-02 ENCOUNTER — Other Ambulatory Visit: Payer: Self-pay

## 2022-01-02 ENCOUNTER — Encounter: Payer: Self-pay | Admitting: Emergency Medicine

## 2022-01-02 ENCOUNTER — Emergency Department
Admission: EM | Admit: 2022-01-02 | Discharge: 2022-01-03 | Disposition: A | Discharge status: Home / Self Care | Payer: Medicare Other | Attending: Emergency Medicine | Admitting: Emergency Medicine

## 2022-01-02 DIAGNOSIS — T8149XA Infection following a procedure, other surgical site, initial encounter: Secondary | ICD-10-CM | POA: Diagnosis present

## 2022-01-02 DIAGNOSIS — R509 Fever, unspecified: Secondary | ICD-10-CM | POA: Diagnosis not present

## 2022-01-02 DIAGNOSIS — R000 Tachycardia, unspecified: Secondary | ICD-10-CM | POA: Insufficient documentation

## 2022-01-02 DIAGNOSIS — T8140XA Infection following a procedure, unspecified, initial encounter: Secondary | ICD-10-CM

## 2022-01-02 LAB — CBC WITH DIFFERENTIAL/PLATELET
Abs Immature Granulocytes: 0.09 10*3/uL — ABNORMAL HIGH (ref 0.00–0.07)
Basophils Absolute: 0.1 10*3/uL (ref 0.0–0.1)
Basophils Relative: 1 %
Eosinophils Absolute: 0 10*3/uL (ref 0.0–0.5)
Eosinophils Relative: 0 %
HCT: 40.4 % (ref 36.0–46.0)
Hemoglobin: 13.2 g/dL (ref 12.0–15.0)
Immature Granulocytes: 1 %
Lymphocytes Relative: 17 %
Lymphs Abs: 2.1 10*3/uL (ref 0.7–4.0)
MCH: 30.7 pg (ref 26.0–34.0)
MCHC: 32.7 g/dL (ref 30.0–36.0)
MCV: 94 fL (ref 80.0–100.0)
Monocytes Absolute: 0.9 10*3/uL (ref 0.1–1.0)
Monocytes Relative: 7 %
Neutro Abs: 9.2 10*3/uL — ABNORMAL HIGH (ref 1.7–7.7)
Neutrophils Relative %: 74 %
Platelets: 330 10*3/uL (ref 150–400)
RBC: 4.3 MIL/uL (ref 3.87–5.11)
RDW: 13.7 % (ref 11.5–15.5)
WBC: 12.4 10*3/uL — ABNORMAL HIGH (ref 4.0–10.5)
nRBC: 0 % (ref 0.0–0.2)

## 2022-01-02 LAB — COMPREHENSIVE METABOLIC PANEL
ALT: 32 U/L (ref 0–44)
AST: 30 U/L (ref 15–41)
Albumin: 3.4 g/dL — ABNORMAL LOW (ref 3.5–5.0)
Alkaline Phosphatase: 61 U/L (ref 38–126)
Anion gap: 13 (ref 5–15)
BUN: 14 mg/dL (ref 8–23)
CO2: 29 mmol/L (ref 22–32)
Calcium: 9.3 mg/dL (ref 8.9–10.3)
Chloride: 96 mmol/L — ABNORMAL LOW (ref 98–111)
Creatinine, Ser: 0.72 mg/dL (ref 0.44–1.00)
GFR, Estimated: >60 mL/min (ref >60)
Glucose, Bld: 101 mg/dL — ABNORMAL HIGH (ref 70–99)
Potassium: 3.2 mmol/L — ABNORMAL LOW (ref 3.5–5.1)
Sodium: 138 mmol/L (ref 135–145)
Total Bilirubin: 0.6 mg/dL (ref 0.3–1.2)
Total Protein: 7.4 g/dL (ref 6.5–8.1)

## 2022-01-02 LAB — SEDIMENTATION RATE: Sed Rate: 63 mm/hr — ABNORMAL HIGH (ref 0–30)

## 2022-01-02 LAB — PROCALCITONIN: Procalcitonin: <0.1 ng/mL

## 2022-01-02 LAB — LACTIC ACID, PLASMA
Lactic Acid, Venous: 2.1 mmol/L (ref 0.5–1.9)
Lactic Acid, Venous: 1.8 mmol/L (ref 0.5–1.9)

## 2022-01-02 MED ORDER — HYDROCODONE-ACETAMINOPHEN 5-325 MG PO TABS
1.0000 | ORAL_TABLET | Freq: Once | ORAL | Status: AC
  Administered 2022-01-02: 1 via ORAL
  Filled 2022-01-02: qty 1

## 2022-01-02 NOTE — ED Triage Notes (Signed)
Pt to ED via POV. Pt states that she had a spinal fusion done at [facility] last week by Dr. Trenton Gammon. Pt states that a couple of days ago she started to notice drainage from the area. Pts incision is red and there is a knot that pt states came up after the surgery. Pt reports that she has been running fever at home, Tmax 101. Pt states that she had not taken medication for fever since last night.

## 2022-01-02 NOTE — ED Notes (Signed)
Patient taken to MRI

## 2022-01-02 NOTE — ED Notes (Signed)
Patient and daughter made aware of plan of care.  All current questions answered.

## 2022-01-02 NOTE — ED Provider Notes (Incomplete)
[facility] Provider Note    Event Date/Time   First MD Initiated Contact with Patient 01/02/22 1756     (approximate)   History   Post-op Problem   HPI  Carol [name] is a 76 y.o. female patient had a cervical fusion by Dr. Deri Fuelling at [facility] last week.  On Thursday 5 days ago she noticed some drainage and some pain.  There is a knot that has come up since surgery and the incision in the area around it is red.  She is running a fever of 101 at home.  She has no numbness or weakness.  She is tachycardic at 120.      Physical Exam   Triage Vital Signs: ED Triage Vitals  Enc Vitals Group     BP 01/02/22 1650 (!) 159/85     Pulse Rate 01/02/22 1650 (!) 120     Resp 01/02/22 1650 20     Temp 01/02/22 1650 98.3 F (36.8 C)     Temp Source 01/02/22 1650 Oral     SpO2 01/02/22 1650 100 %     Weight --      Height --      Head Circumference --      Peak Flow --      Pain Score 01/02/22 1651 7     Pain Loc --      Pain Edu? --      Excl. in [place]? --     Most recent vital signs: Vitals:   01/02/22 2230 01/02/22 2245  BP: 132/70 131/71  Pulse: 95 94  Resp: 14 12  Temp:    SpO2: 96% 96%     General: Awake, no distress.  Neck: Slightly stiff with redness and swelling around the incision extending out by about 2 cm.  There is also or tender swollen lump at the top of the incision.  Patient reports that came up after surgery. CV:  Good peripheral perfusion.  Heart regular rate and rhythm no audible murmur Resp:  Normal effort.  Abd:  No distention.  Neuro: No apparent focal deficits   ED Results / Procedures / Treatments   Labs (all labs ordered are listed, but only abnormal results are displayed) Labs Reviewed  CBC WITH DIFFERENTIAL/PLATELET - Abnormal; Notable for the following components:      Result Value   WBC 12.4 (*)    Neutro Abs 9.2 (*)    Abs Immature Granulocytes 0.09 (*)    All other components within normal limits   LACTIC ACID, PLASMA - Abnormal; Notable for the following components:   Lactic Acid, Venous 2.1 (*)    All other components within normal limits  COMPREHENSIVE METABOLIC PANEL - Abnormal; Notable for the following components:   Potassium 3.2 (*)    Chloride 96 (*)    Glucose, Bld 101 (*)    Albumin 3.4 (*)    All other components within normal limits  SEDIMENTATION RATE - Abnormal; Notable for the following components:   Sed Rate 63 (*)    All other components within normal limits  CULTURE, BLOOD (ROUTINE X 2)  CULTURE, BLOOD (ROUTINE X 2)  LACTIC ACID, PLASMA  PROCALCITONIN  PROCALCITONIN  C-REACTIVE PROTEIN  URINALYSIS, ROUTINE W REFLEX MICROSCOPIC     EKG     RADIOLOGY MRI shows several different abnormalities but none of them are felt to be infectious   PROCEDURES:  Critical Care performed:   Procedures   MEDICATIONS  ORDERED IN ED: Medications  vancomycin (VANCOREADY) IVPB 1500 mg/300 mL (has no administration in time range)  sodium chloride 0.9 % bolus 1,000 mL (has no administration in time range)  HYDROcodone-acetaminophen (NORCO/VICODIN) 5-325 MG per tablet 1 tablet (1 tablet Oral Given 01/02/22 2342)     IMPRESSION / MDM / ASSESSMENT AND PLAN / ED COURSE  I reviewed the triage vital signs and the nursing notes. Discussed with Dr. Autumn Patty neurosurgery at [facility] who dialysis this is a wound infection wants me to check a chest x-ray and urinalysis and have the patient admitted to medicine at [facility].  Chest x-ray was obtained.  Is negative.  Patient still has not been out of provide a urine specimen.  I discussed her with Dr. Carollee Herter at [facility] on the medicine service.  He will accept the patient under get a try and get Dr. Johnsie Cancel back and just make sure he will consult later.  I am giving the patient a little bit of fluid in the hopes that this will make her urinate.  Discussing the patient in detail with Dr. Imogene Burn we will give her some vancomycin IV  likely not anything besides that currently.  Differential diagnosis includes, but is not limited to, wound infection or infection at another source although patient does not any symptoms consistent with any other source of infection but it is possible.  Just irritation from the dressing or something similar.  That would not explain the fever and tachycardia however.  Patient's presentation is most consistent with acute presentation with potential threat to life or bodily function.      FINAL CLINICAL IMPRESSION(S) / ED DIAGNOSES   Final diagnoses:  Postoperative infection, unspecified type, initial encounter  This really should be apparent postoperative infection but I cannot find that in the computer.   Rx / DC Orders   ED Discharge Orders     None        Note:  This document was prepared using Dragon voice recognition software and may include unintentional dictation errors.   Arnaldo Natal, MD 01/03/22 0019 ----------------------------------------- 12:22 AM on 01/03/2022 ----------------------------------------- Discussed briefly with Dr. Johnsie Cancel again.  He agrees that they that is neurosurgery will check on her in the morning after she gets there.   Arnaldo Natal, MD 01/03/22 Rich Fuchs

## 2022-01-02 NOTE — ED Provider Triage Note (Signed)
Emergency Medicine Provider Triage Evaluation Note  Carol [name], a 76 y.o. female was evaluated in triage.  Pt complains of postop wound dehiscence, fevers, and neck stiffness.  She is less than 1 week status post a posterior cervical spinal fusion performed by Dr. Trenton Gammon in [place] last week.  She noted drainage from the suture cervical surgical site a few days earlier.  Today she reports redness, pain, and local area of soft tissue swelling.  She reports fever at home with a Tmax of 101 F.  Review of Systems  Positive: Fevers, post-op wound infection Negative: NVD  Physical Exam  BP (!) 159/85 (BP Location: Left Arm)   Pulse (!) 120   Temp 98.3 F (36.8 C) (Oral)   Resp 20   SpO2 100%  Gen:   Awake, no distress  NAD Resp:  Normal effort CTA MSK:   Moves extremities without difficulty. Neck stiffness due to pain SKIN:  Local erythema, induration and wound dehiscence to the posterior cervical surgical scar  Medical Decision Making  Medically screening exam initiated at 4:58 PM.  Appropriate orders placed.  Carol [name] was informed that the remainder of the evaluation will be completed by another provider, this initial triage assessment does not replace that evaluation, and the importance of remaining in the ED until their evaluation is complete.  Patient to the ED for evaluation of postop wound infection.  She presents objective fevers and presents tachycardic from home   Carol [name], Dannielle Karvonen, PA-C 01/02/22 1703

## 2022-01-03 ENCOUNTER — Observation Stay: Admit: 2022-01-03 | Payer: Medicare Other | Admitting: Internal Medicine

## 2022-01-03 ENCOUNTER — Encounter (HOSPITAL_COMMUNITY): Payer: Self-pay

## 2022-01-03 LAB — URINALYSIS, ROUTINE W REFLEX MICROSCOPIC
Bilirubin Urine: NEGATIVE
Glucose, UA: NEGATIVE mg/dL
Hgb urine dipstick: NEGATIVE
Ketones, ur: NEGATIVE mg/dL
Leukocytes,Ua: NEGATIVE
Nitrite: NEGATIVE
Protein, ur: NEGATIVE mg/dL
Specific Gravity, Urine: 1.011 (ref 1.005–1.030)
pH: 7 (ref 5.0–8.0)

## 2022-01-03 LAB — C-REACTIVE PROTEIN: CRP: 7.2 mg/dL — ABNORMAL HIGH (ref <1.0)

## 2022-01-03 LAB — PROCALCITONIN: Procalcitonin: <0.1 ng/mL

## 2022-01-03 MED ORDER — HYDROCODONE-ACETAMINOPHEN 5-325 MG PO TABS
1.0000 | ORAL_TABLET | ORAL | Status: DC | PRN
  Administered 2022-01-03: 1 via ORAL
  Filled 2022-01-03: qty 1

## 2022-01-03 MED ORDER — VANCOMYCIN HCL 1500 MG/300ML IV SOLN
1500.0000 mg | INTRAVENOUS | Status: DC
  Administered 2022-01-03: 1500 mg via INTRAVENOUS
  Filled 2022-01-03: qty 300

## 2022-01-03 MED ORDER — CEPHALEXIN 500 MG PO CAPS: 500.0000 mg | ORAL_CAPSULE | Freq: Three times a day (TID) | ORAL | 0 refills | Status: DC

## 2022-01-03 MED ORDER — SULFAMETHOXAZOLE-TRIMETHOPRIM 800-160 MG PO TABS: 1.0000 | ORAL_TABLET | Freq: Two times a day (BID) | ORAL | 0 refills | Status: DC

## 2022-01-03 MED ORDER — SODIUM CHLORIDE 0.9 % IV BOLUS
1000.0000 mL | Freq: Once | INTRAVENOUS | Status: AC
  Administered 2022-01-03: 1000 mL via INTRAVENOUS

## 2022-01-03 NOTE — ED Notes (Signed)
Pt A&O, IV removed, pt left AMA, pt ambulating with steady gait.

## 2022-01-03 NOTE — Discharge Instructions (Addendum)
We have sent prescriptions for antibiotics to continue treating your neck infection.  You should return to the emergency room right away if you start having difficulty breathing or swallowing.  Call your surgeon today to arrange a follow-up appointment.  If you change your mind and are agreeable to being hospitalized, please return to the emergency department right away.  You are always welcome to return to this hospital.

## 2022-01-03 NOTE — ED Notes (Signed)
Pt assisted to bathroom. Pt advised to hit call bell once done.

## 2022-01-03 NOTE — ED Provider Notes (Signed)
-----------------------------------------   6:09 AM on 01/03/2022 -----------------------------------------   No events overnight.  Patient remains in the emergency department pending bed availability at [place], [place], MD 01/03/22 [phone]

## 2022-01-03 NOTE — ED Notes (Signed)
RN at bedside. Pt stating she would rather leave then stay here any longer. Pt stating it is

## 2022-01-03 NOTE — ED Provider Notes (Signed)
Procedures     ----------------------------------------- 2:15 PM on 01/03/2022 -----------------------------------------   Patient has had a prolonged wait for transfer to [facility] for management of her postoperative infection.  She indicates that she is no longer willing to wait and intends to leave [place].  She has medical decision-making capacity.  Her daughter is on the way, and she plans to leave within the next few minutes and is not willing to wait any longer.  I have sent prescriptions for Bactrim and Keflex to manage her postoperative infection as best as possible, return precautions given, encouraged her to follow-up with her surgeon.   Carrie Mew, MD 01/03/22 1416

## 2022-01-03 NOTE — ED Notes (Signed)
Provider notified pt is wishing to go home, stating just give me my papers, they sent me this food and they feed you better in prison, just throw it away.

## 2022-01-07 LAB — CULTURE, BLOOD (ROUTINE X 2)
Culture: NO GROWTH
Special Requests: ADEQUATE

## 2022-01-24 ENCOUNTER — Encounter: Payer: Medicare Other | Admitting: Student in an Organized Health Care Education/Training Program

## 2022-01-30 ENCOUNTER — Inpatient Hospital Stay: Admission: RE | Admit: 2022-01-30 | Payer: Medicare Other | Source: Ambulatory Visit

## 2022-03-21 NOTE — Progress Notes (Unsigned)
Cardiology Office Note  Date:  03/22/2022   ID:  Carol [name], DOB 03/26/1945, MRN [id number]  PCP:  Carol Lange, NP   Chief Complaint  Patient presents with   Shortness of Breath    Patient c/o shortness of breath at times with walking 20 ft. Medications reviewed by the patient verbally.     HPI:  [name] is a 77 year old woman with past medical history of  Smoking, stopped over 7 years ago Mild COPD Hypertension Who presents for routine follow-up of her coronary disease/stroke, shortness of breath, breaking out in cold sweats, tremors, fast heartbeat  Last seen by myself in clinic December 2022 SOB over the summer Still a little bit in the fall No regular exercise program  Recent neck surgery, Dr. Trenton Gammon Now with lower back pain, outer thighs on fire  Has noted Left ankle swelling Previously diagnosed with vein disorder lower extremities, had treatments with Dr. Delana Meyer Sedentary as she has been recovering from neck surgery  Was seen by pulmonary, Dr. Raul Del started on inhaler as needed, she does not think this is helping her breathing  Lab work reviewed Total chol 176, LDL 51,  A1C 5.9  EKG personally reviewed by myself on todays visit Normal sinus rhythm rate 97 bpm no significant ST-T wave changes  Other past medical history reviewed CT scan chest abdomen pelvis  No significant coronary calcification Very minimal aortic atherosclerosis noted  Previously reported anxiety causing tremor, cold sweats  family history Mother with brain aneurysm died in her 69s, Brother had other cardiac issues   PMH:   has a past medical history of Anxiety, Arthritis, COVID-19 (11/2020), Depression, Headache, Hypertension, Multilevel degenerative disc disease, Positive skin test for tuberculosis, Wears contact lenses, Wears dentures, and Wears hearing aid in right ear.  PSH:    Past Surgical History:  Procedure Laterality Date   ABDOMINAL HYSTERECTOMY      ANKLE ARTHROSCOPY Left 10/30/2019   Procedure: A-SCOPE/DEBRIDEMENT, EXTENSIVE, LEFT ANKLE;  Surgeon: Samara Deist, DPM;  Location: [facility];  Service: Podiatry;  Laterality: Left;   ANKLE RECONSTRUCTION Left 10/30/2019   Procedure: BROSTRUM-GOULD LEFT ANKLE;  Surgeon: Samara Deist, DPM;  Location: [facility];  Service: Podiatry;  Laterality: Left;   BACK SURGERY     x3 previous cervical fusion & then removed hardware    BREAST SURGERY Right    benign-    CATARACT EXTRACTION W/PHACO Left 01/03/2021   Procedure: CATARACT EXTRACTION PHACO AND INTRAOCULAR LENS PLACEMENT ([place]) LEFT;  Surgeon: Eulogio Bear, MD;  Location: [facility];  Service: Ophthalmology;  Laterality: Left;  7.32 0:46.3   CATARACT EXTRACTION W/PHACO Right 01/17/2021   Procedure: CATARACT EXTRACTION PHACO AND INTRAOCULAR LENS PLACEMENT (IOC) RIGHT;  Surgeon: Eulogio Bear, MD;  Location: [facility];  Service: Ophthalmology;  Laterality: Right;  5.09 0:32.9   HAND TENDON SURGERY Right 2016   JOINT REPLACEMENT Right    also had a partial on the L knee   LUMBAR FUSION     MYRINGOTOMY WITH TUBE PLACEMENT Right    POSTERIOR CERVICAL FUSION/FORAMINOTOMY N/A 12/26/2021   Procedure: Posterior cervical fusion with lateral mass fixation - C5-C6;  Surgeon: Earnie Larsson, MD;  Location: [facility];  Service: Neurosurgery;  Laterality: N/A;   TONSILLECTOMY     TUBAL LIGATION     had a 2nd. surgery for adhesions    Current Outpatient Medications  Medication Sig Dispense Refill   albuterol (VENTOLIN HFA) 108 (90 Base) MCG/ACT inhaler SMARTSIG:2  Inhalation Via Inhaler Every 6 Hours PRN     Calcium Carbonate-Vitamin D (CALTRATE 600+D PO) Take 1 tablet by mouth daily.      cephALEXin (KEFLEX) 500 MG capsule Take 1 capsule (500 mg total) by mouth 3 (three) times daily. 21 capsule 0   cetirizine (ZYRTEC) 10 MG tablet Take 10 mg by mouth daily.     chlorthalidone (HYGROTON) 50 MG tablet Take 50 mg by mouth daily.      Cholecalciferol (VITAMIN D) 125 MCG (5000 UT) CAPS Take 5,000 Units by mouth daily.     clonazePAM (KLONOPIN) 0.5 MG tablet Take 0.5 mg by mouth daily as needed for anxiety.      cyclobenzaprine (FLEXERIL) 10 MG tablet Take 1 tablet (10 mg total) by mouth 3 (three) times daily as needed for muscle spasms. 30 tablet 0   fluticasone (FLONASE) 50 MCG/ACT nasal spray Place 2 sprays into both nostrils daily as needed for allergies.      gabapentin (NEURONTIN) 100 MG capsule Take 100 mg by mouth 3 (three) times daily.     HYDROcodone-acetaminophen (NORCO) 10-325 MG tablet Take 1-2 tablets by mouth every 4 (four) hours as needed for severe pain ((score 7 to 10)). 40 tablet 0   ondansetron (ZOFRAN-ODT) 4 MG disintegrating tablet Take 1 tablet by translingual route every 6 hours as needed for nausea. Place on top of the tongue where it will dissolve, then swallow.     potassium chloride SA (KLOR-CON M) 20 MEQ tablet Take 20 mEq by mouth 2 (two) times daily.     pyridOXINE (VITAMIN B-6) 100 MG tablet Take 100 mg by mouth daily.     rosuvastatin (CRESTOR) 5 MG tablet Take 1 tablet by mouth daily.     Sod Fluoride-Potassium Nitrate (SODIUM FLUORIDE 5000 SENSITIVE) 1.1-5 % GEL      sulfamethoxazole-trimethoprim (BACTRIM DS) 800-160 MG tablet Take 1 tablet by mouth 2 (two) times daily. 14 tablet 0   SUMAtriptan (IMITREX) 100 MG tablet Take 1 tablet (100 mg total) by mouth daily as needed for migraine. May repeat in 2 hours if headache persists or recurs. 10 tablet 2   traMADol (ULTRAM) 50 MG tablet Take 1-2 tablets (50-100 mg total) by mouth every 8 (eight) hours as needed. 180 tablet 0   traZODone (DESYREL) 50 MG tablet Take 50 mg by mouth at bedtime as needed for sleep.      venlafaxine XR (EFFEXOR-XR) 150 MG 24 hr capsule Take 150 mg by mouth daily.     No current facility-administered medications for this visit.     Allergies:   Gentamicin, Celecoxib, Hydralazine, Ibandronic acid, Oxycodone hcl, and  Penicillins   Social History:  The patient  reports that she quit smoking about 46 years ago. Her smoking use included cigarettes. She has never used smokeless tobacco. She reports that she does not currently use drugs after having used the following drugs: Marijuana. Frequency: 1.00 time per week. She reports that she does not drink alcohol.   Family History:   family history includes Aneurysm in her mother; Heart disease in her mother; Hyperlipidemia in her brother and mother; Hypertension in her brother and mother; Lung cancer in her brother.    Review of Systems: Review of Systems  Constitutional: Negative.   HENT: Negative.    Respiratory:  Positive for shortness of breath.   Cardiovascular:  Positive for leg swelling.  Gastrointestinal: Negative.   Musculoskeletal: Negative.   Neurological: Negative.   Psychiatric/Behavioral: Negative.  All other systems reviewed and are negative.    PHYSICAL EXAM: VS:  BP (!) 140/70 (BP Location: Left Arm, Patient Position: Sitting, Cuff Size: Normal)   Pulse 97   Ht 5' 3.5" (1.613 m)   Wt 187 lb 4 oz (84.9 kg)   SpO2 98%   BMI 32.65 kg/m  , BMI Body mass index is 32.65 kg/m. Constitutional:  oriented to person, place, and time. No distress.  HENT:  Head: Grossly normal Eyes:  no discharge. No scleral icterus.  Neck: No JVD, no carotid bruits  Cardiovascular: Regular rate and rhythm, no murmurs appreciated No significant ankle swelling appreciated on exam Pulmonary/Chest: Clear to auscultation bilaterally, no wheezes or rails Abdominal: Soft.  no distension.  no tenderness.  Musculoskeletal: Normal range of motion Neurological:  normal muscle tone. Coordination normal. No atrophy Skin: Skin warm and dry Psychiatric: normal affect, pleasant  Recent Labs: 01/02/2022: ALT 32; BUN 14; Creatinine, Ser 0.72; Hemoglobin 13.2; Platelets 330; Potassium 3.2; Sodium 138    Lipid Panel Lab Results  Component Value Date   CHOL 178  07/19/2016   HDL 73 07/19/2016   LDLCALC 68 07/19/2016   TRIG 186 (H) 07/19/2016      Wt Readings from Last 3 Encounters:  03/22/22 187 lb 4 oz (84.9 kg)  12/26/21 184 lb (83.5 kg)  12/20/21 180 lb (81.6 kg)     ASSESSMENT AND PLAN:  Problem List Items Addressed This Visit       Cardiology Problems   Hypertension - Primary   Relevant Medications   rosuvastatin (CRESTOR) 5 MG tablet   Hyperlipidemia   Relevant Medications   rosuvastatin (CRESTOR) 5 MG tablet   Chronic venous insufficiency   Relevant Medications   rosuvastatin (CRESTOR) 5 MG tablet  Family history coronary disease Current non-smoker, cholesterol reasonable, no diabetes CT scan :no significant coronary calcification, there is minimal aortic atherosclerosis No further cardiac testing needed at this time  Shortness of breath Etiology unclear, possibly deconditioning, symptoms worse over the summer in the heat Echocardiogram ordered, no prior baseline available  Ankle swelling Minimal if any on exam today, recommended compression hose She does report having prior history of vein disorder, previously treated by Dr. Delana Meyer  Hyperlipidemia  continue low-dose Crestor  Essential hypertension Blood pressure is well controlled on today's visit. No changes made to the medications.  Stressed the importance of taking her potassium daily with her chlorthalidone given hypokalemia history    Total encounter time more than 30 minutes  Greater than 50% was spent in counseling and coordination of care with the patient    Signed, Esmond Plants, M.D., Ph.D. [place], [place]

## 2022-03-22 ENCOUNTER — Ambulatory Visit: Payer: Medicare Other | Attending: Cardiovascular Disease | Admitting: Cardiovascular Disease

## 2022-03-22 ENCOUNTER — Encounter: Payer: Self-pay | Admitting: Cardiovascular Disease

## 2022-03-22 VITALS — BP 140/70 | HR 97 | Ht 63.5 in | Wt 187.2 lb

## 2022-03-22 DIAGNOSIS — I10 Essential (primary) hypertension: Secondary | ICD-10-CM | POA: Diagnosis not present

## 2022-03-22 DIAGNOSIS — R0602 Shortness of breath: Secondary | ICD-10-CM | POA: Diagnosis not present

## 2022-03-22 DIAGNOSIS — R600 Localized edema: Secondary | ICD-10-CM

## 2022-03-22 DIAGNOSIS — I872 Venous insufficiency (chronic) (peripheral): Secondary | ICD-10-CM | POA: Diagnosis not present

## 2022-03-22 DIAGNOSIS — E782 Mixed hyperlipidemia: Secondary | ICD-10-CM | POA: Diagnosis not present

## 2022-03-22 NOTE — Patient Instructions (Addendum)
Wear compression hose for ankle swelling  Medication Instructions:  No changes  If you need a refill on your cardiac medications before your next appointment, please call your pharmacy.   Lab work: No new labs needed  Testing/Procedures: Echo for shortness of breath, leg edema  Your physician has requested that you have an echocardiogram. Echocardiography is a painless test that uses sound waves to create images of your heart. It provides your doctor with information about the size and shape of your heart and how well your heart's chambers and valves are working. This procedure takes approximately one hour. There are no restrictions for this procedure. Please do NOT wear cologne, perfume, aftershave, or lotions (deodorant is allowed). Please arrive 15 minutes prior to your appointment time.   Follow-Up: At [facility], you and your health needs are our priority.  As part of our continuing mission to provide you with exceptional heart care, we have created designated Provider Care Teams.  These Care Teams include your primary Cardiologist (physician) and Advanced Practice Providers (APPs -  Physician Assistants and Nurse Practitioners) who all work together to provide you with the care you need, when you need it.  You will need a follow up appointment in 12 months  Providers on your designated Care Team:   Murray Hodgkins, NP Christell Faith, PA-C Cadence Kathlen Mody, [place]  COVID-19 Vaccine Information can be found at: [email or web address] For questions related to vaccine distribution or appointments, please email [email or web address] or call [phone].

## 2022-03-29 ENCOUNTER — Other Ambulatory Visit: Payer: Self-pay | Admitting: Neurosurgery

## 2022-03-29 DIAGNOSIS — S32009K Unspecified fracture of unspecified lumbar vertebra, subsequent encounter for fracture with nonunion: Secondary | ICD-10-CM

## 2022-04-06 ENCOUNTER — Ambulatory Visit: Payer: Medicare Other

## 2022-04-07 ENCOUNTER — Ambulatory Visit: Payer: Medicare Other

## 2022-04-17 ENCOUNTER — Ambulatory Visit
Admission: RE | Admit: 2022-04-17 | Discharge: 2022-04-17 | Disposition: A | Discharge status: Home / Self Care | Payer: Medicare Other | Source: Ambulatory Visit | Attending: Neurosurgery | Admitting: Neurosurgery

## 2022-04-17 DIAGNOSIS — S32009K Unspecified fracture of unspecified lumbar vertebra, subsequent encounter for fracture with nonunion: Secondary | ICD-10-CM | POA: Insufficient documentation

## 2022-05-15 ENCOUNTER — Ambulatory Visit: Payer: Medicare Other | Attending: Cardiovascular Disease

## 2022-05-15 DIAGNOSIS — R600 Localized edema: Secondary | ICD-10-CM | POA: Diagnosis not present

## 2022-05-15 DIAGNOSIS — R0602 Shortness of breath: Secondary | ICD-10-CM | POA: Diagnosis not present

## 2022-05-15 LAB — ECHOCARDIOGRAM COMPLETE
Area-P 1/2: 3.74 cm2
S' Lateral: 2.9 cm

## 2022-05-18 ENCOUNTER — Telehealth: Payer: Self-pay | Admitting: Cardiovascular Disease

## 2022-05-18 NOTE — Telephone Encounter (Signed)
Pt calling to f/u on Echo results. Please advise

## 2022-05-18 NOTE — Telephone Encounter (Signed)
Spoke to patient and gave results to recent echo as normal. Patient satisfied with response.

## 2022-08-02 ENCOUNTER — Ambulatory Visit (INDEPENDENT_AMBULATORY_CARE_PROVIDER_SITE_OTHER): Payer: Medicare HMO | Admitting: Nurse Practitioner

## 2022-08-02 VITALS — BP 131/84 | HR 92 | Temp 97.9°F | Ht 63.0 in | Wt 187.0 lb

## 2022-08-02 DIAGNOSIS — E669 Obesity, unspecified: Secondary | ICD-10-CM | POA: Diagnosis not present

## 2022-08-02 DIAGNOSIS — R7303 Prediabetes: Secondary | ICD-10-CM

## 2022-08-02 DIAGNOSIS — Z0289 Encounter for other administrative examinations: Secondary | ICD-10-CM

## 2022-08-02 DIAGNOSIS — Z6833 Body mass index (BMI) 33.0-33.9, adult: Secondary | ICD-10-CM

## 2022-08-02 DIAGNOSIS — E7849 Other hyperlipidemia: Secondary | ICD-10-CM | POA: Diagnosis not present

## 2022-08-02 NOTE — Progress Notes (Signed)
Office: [phone]  /  Fax: [phone]   Initial Visit  Carol [name] was seen in clinic today to evaluate for obesity. She is interested in losing weight to improve overall health and reduce the risk of weight related complications. She presents today to review program treatment options, initial physical assessment, and evaluation.     She was referred by: PCP  When asked what else they would like to accomplish? She states: Improve existing medical conditions, Reduce number of medications, and Lose a target amount of weight : Goal weight 155-160 lbs  Weight history:  She started gaining weight during menopause and excess weight when she started Lyrica.     When asked how has your weight affected you? She states: back and knee pain, shortness of breath, decreased energy and fatigue  Some associated conditions: chronic venous insufficiency, HTN, migraines, varicose veins, allergies, lung nodules, back pain, neck pain, osteopenia, vit d def, fibromyalgia, arthritis, HLD, pre diabetes  Contributing factors: Family history, Medications, Reduced physical activity, Life event, and Menopause  Weight promoting medications identified: Lyrica  Current nutrition plan: Weight Watchers  Current level of physical activity: Walking  Current or previous pharmacotherapy: None  Response to medication: Never tried medications   Past medical history includes:   Past Medical History:  Diagnosis Date   Anxiety    Arthritis    COVID-19 11/2020   symptom free since 12/08/20   Depression    Headache    migraine - last one 2016   Hypertension    reason that she was given [place] & she remarks that her BP has been better she has been taking it.    Multilevel degenerative disc disease    Positive skin test for tuberculosis    told that she is a carrier, (treated age 18 and as adult) Negative x-ray(per pt)   Wears contact lenses    Wears dentures    partial upper   Wears hearing aid in  right ear      Objective:   BP 131/84   Pulse 92   Temp 97.9 F (36.6 C)   Ht 5\' 3"  (1.6 m)   Wt 187 lb (84.8 kg)   SpO2 98%   BMI 33.13 kg/m  She was weighed on the bioimpedance scale: Body mass index is 33.13 kg/m.  Peak Weight:200 lbs , Body Fat%:46%, Visceral Fat Rating:14, Weight trend over the last 12 months: Increasing  General:  Alert, oriented and cooperative. Patient is in no acute distress.  Respiratory: Normal respiratory effort, no problems with respiration noted   Gait: able to ambulate independently  Mental Status: Normal mood and affect. Normal behavior. Normal judgment and thought content.   DIAGNOSTIC DATA REVIEWED:  BMET    Component Value Date/Time   NA 138 01/02/2022 1709   NA 138 07/19/2016 1556   NA 136 09/06/2012 0718   K 3.2 (L) 01/02/2022 1709   K 3.4 (L) 09/06/2012 0718   CL 96 (L) 01/02/2022 1709   CL 104 09/06/2012 0718   CO2 29 01/02/2022 1709   CO2 27 09/06/2012 0718   GLUCOSE 101 (H) 01/02/2022 1709   GLUCOSE 102 (H) 09/06/2012 0718   BUN 14 01/02/2022 1709   BUN 17 07/19/2016 1556   BUN 9 09/06/2012 0718   CREATININE 0.72 01/02/2022 1709   CREATININE 1.01 09/06/2012 0718   CALCIUM 9.3 01/02/2022 1709   CALCIUM 8.9 09/06/2012 0718   GFRNONAA >60 01/02/2022 1709   GFRNONAA 58 (L) 09/06/2012 2841  GFRAA >60 09/24/2019 2004   GFRAA >60 09/06/2012 0718   No results found for: "HGBA1C" No results found for: "INSULIN" CBC    Component Value Date/Time   WBC 12.4 (H) 01/02/2022 1653   RBC 4.30 01/02/2022 1653   HGB 13.2 01/02/2022 1653   HGB 14.2 07/19/2016 1556   HCT 40.4 01/02/2022 1653   HCT 43.2 07/19/2016 1556   PLT 330 01/02/2022 1653   PLT 263 07/19/2016 1556   MCV 94.0 01/02/2022 1653   MCV 84 07/19/2016 1556   MCV 90 09/06/2012 0718   MCH 30.7 01/02/2022 1653   MCHC 32.7 01/02/2022 1653   RDW 13.7 01/02/2022 1653   RDW 16.4 (H) 07/19/2016 1556   RDW 13.9 09/06/2012 0718   Iron/TIBC/Ferritin/ %Sat No results  found for: "IRON", "TIBC", "FERRITIN", "IRONPCTSAT" Lipid Panel     Component Value Date/Time   CHOL 178 07/19/2016 1556   TRIG 186 (H) 07/19/2016 1556   HDL 73 07/19/2016 1556   CHOLHDL 2.4 07/19/2016 1556   LDLCALC 68 07/19/2016 1556   Hepatic Function Panel     Component Value Date/Time   PROT 7.4 01/02/2022 1709   PROT 7.2 07/19/2016 1556   PROT 7.1 10/02/2011 2012   ALBUMIN 3.4 (L) 01/02/2022 1709   ALBUMIN 4.9 (H) 07/19/2016 1556   ALBUMIN 4.0 10/02/2011 2012   AST 30 01/02/2022 1709   AST 88 (H) 10/02/2011 2012   ALT 32 01/02/2022 1709   ALT 86 (H) 10/02/2011 2012   ALKPHOS 61 01/02/2022 1709   ALKPHOS 83 10/02/2011 2012   BILITOT 0.6 01/02/2022 1709   BILITOT 0.3 07/19/2016 1556   BILITOT 1.0 10/02/2011 2012      Component Value Date/Time   TSH 2.140 07/19/2016 1556     Assessment and Plan:   Other hyperlipidemia Continue to follow up with PCP. Continue meds as directed.   Pre-diabetes Will continue to monitor.    Generalized obesity  BMI 33.0-33.9,adult        Obesity Treatment / Action Plan:  Patient will work on garnering support from family and friends to begin weight loss journey. Will work on eliminating or reducing the presence of highly palatable, calorie dense foods in the home. Will complete provided nutritional and psychosocial assessment questionnaire before the next appointment. Will be scheduled for indirect calorimetry to determine resting energy expenditure in a fasting state.  This will allow [place] to create a reduced calorie, high-protein meal plan to promote loss of fat mass while preserving muscle mass. Counseled on the health benefits of losing 5%-15% of total body weight. Was counseled on nutritional approaches to weight loss and benefits of reducing processed foods and consuming plant-based foods and high quality protein as part of nutritional weight management. Was counseled on pharmacotherapy and role as an adjunct in weight  management.   Obesity Education Performed Today:  She was weighed on the bioimpedance scale and results were discussed and documented in the synopsis.  We discussed obesity as a disease and the importance of a more detailed evaluation of all the factors contributing to the disease.  We discussed the importance of long term lifestyle changes which include nutrition, exercise and behavioral modifications as well as the importance of customizing this to her specific health and social needs.  We discussed the benefits of reaching a healthier weight to alleviate the symptoms of existing conditions and reduce the risks of the biomechanical, metabolic and psychological effects of obesity.  SALENA TORNETTA appears to be in  the action stage of change and states they are ready to start intensive lifestyle modifications and behavioral modifications.  30 minutes was spent today on this visit including the above counseling, pre-visit chart review, and post-visit documentation.  Reviewed by clinician on day of visit: allergies, medications, problem list, medical history, surgical history, family history, social history, and previous encounter notes pertinent to obesity diagnosis.    Theodis Sato Jakyrie Totherow FNP-C

## 2022-08-10 ENCOUNTER — Encounter: Payer: Self-pay | Admitting: Student in an Organized Health Care Education/Training Program

## 2022-08-10 ENCOUNTER — Ambulatory Visit
Payer: Medicare HMO | Attending: Student in an Organized Health Care Education/Training Program | Admitting: Student in an Organized Health Care Education/Training Program

## 2022-08-10 VITALS — BP 133/69 | HR 85 | Temp 98.2°F | Resp 18 | Ht 63.0 in | Wt 187.0 lb

## 2022-08-10 DIAGNOSIS — S129XXS Fracture of neck, unspecified, sequela: Secondary | ICD-10-CM | POA: Insufficient documentation

## 2022-08-10 DIAGNOSIS — G8929 Other chronic pain: Secondary | ICD-10-CM | POA: Diagnosis present

## 2022-08-10 DIAGNOSIS — M5010 Cervical disc disorder with radiculopathy, unspecified cervical region: Secondary | ICD-10-CM | POA: Diagnosis present

## 2022-08-10 DIAGNOSIS — M47818 Spondylosis without myelopathy or radiculopathy, sacral and sacrococcygeal region: Secondary | ICD-10-CM | POA: Insufficient documentation

## 2022-08-10 DIAGNOSIS — G894 Chronic pain syndrome: Secondary | ICD-10-CM | POA: Diagnosis present

## 2022-08-10 DIAGNOSIS — M5416 Radiculopathy, lumbar region: Secondary | ICD-10-CM | POA: Diagnosis present

## 2022-08-10 DIAGNOSIS — M47816 Spondylosis without myelopathy or radiculopathy, lumbar region: Secondary | ICD-10-CM | POA: Diagnosis present

## 2022-08-10 MED ORDER — HYDROCODONE-ACETAMINOPHEN 10-325 MG PO TABS: 1.0000 | ORAL_TABLET | Freq: Three times a day (TID) | ORAL | 0 refills | Status: AC | PRN

## 2022-08-10 NOTE — Progress Notes (Signed)
PROVIDER NOTE: Information contained herein reflects review and annotations entered in association with encounter. Interpretation of such information and data should be left to medically-trained personnel. Information provided to patient can be located elsewhere in the medical record under "Patient Instructions". Document created using STT-dictation technology, any transcriptional errors that may result from process are unintentional.    Patient: Carol [name]  Service Category: E/M  Provider: Edward Jolly, MD  DOB: 28-Aug-1945  DOS: 08/10/2022  Specialty: Interventional Pain Management  MRN: [id number]  Setting: Ambulatory outpatient  PCP: Carol Buddy, NP  Type: Established Patient    Referring Provider: Myrene [name], *  Location: Office  Delivery: Face-to-face     HPI  Reason for encounter: Carol [name], a 77 y.o. year old female, is here today for evaluation and management of her Lumbar radiculopathy [M54.16]. Carol [name] primary complain today is Back Pain Last encounter: Practice (09/02/2019). My last encounter with her was on 06/01/21 Pertinent problems: Carol [name] has Degenerative spondylolisthesis; Fibromyalgia; OA (osteoarthritis); Status post left partial knee replacement; Osteopenia of spine; [name] of lumbar fusion; Hx of fusion of cervical spine; Obesity (BMI 30.0-34.9); Leg pain, left; Lumbar radiculopathy; and Chronic pain syndrome on their pertinent problem list. Pain Assessment: Severity of Chronic pain is reported as a 1 /10. Location: Back Lower, Right, Left/Radaites to hips bilateral and sometimes at worse down hips and outer leg bilateral. Onset: More than a month ago. Quality: Contraction, Throbbing. Timing: Constant. Modifying factor(s): Aleve, tramadol, hydrocodone when pain is worse. Vitals:  height is 5\' 3"  (1.6 m) and weight is 187 lb (84.8 kg). Her temporal temperature is 98.2 F (36.8 C). Her blood pressure is 133/69 and her pulse is 85. Her  respiration is 18 and oxygen saturation is 100%.   Patient is status post spine [name] with Dr. Dutch Quint.  She states that she is recovering well from that [name].  She continues to have intermittent low back pain with radiation into bilateral hips and down her lateral legs.  She states that she did find benefit with hydrocodone after her [name] and would take it only on a as needed basis.  She is also requesting a refill of her tramadol which she takes as needed.  She takes gabapentin also on as needed basis.   ROS  Constitutional: Denies any fever or chills Gastrointestinal: No reported hemesis, hematochezia, vomiting, or acute GI distress Musculoskeletal: Low back pain with radiation into bilateral hips and down her outer leg. Neurological: No reported episodes of acute onset apraxia, aphasia, dysarthria, agnosia, amnesia, paralysis, loss of coordination, or loss of consciousness  Medication Review  Calcium Carbonate-Vitamin D, HYDROcodone-acetaminophen, SUMAtriptan, Sod Fluoride-Potassium Nitrate, Vitamin D, albuterol, cephALEXin, cetirizine, chlorthalidone, clonazePAM, cyclobenzaprine, fluticasone, gabapentin, ondansetron, potassium chloride SA, pyridOXINE, rosuvastatin, sulfamethoxazole-trimethoprim, traMADol, and venlafaxine XR  [name] Review  Allergy: Carol [name], celecoxib, hydralazine, ibandronic acid, oxycodone hcl, and penicillins. Drug: Carol [name]  reports that she does not currently use drugs after having used the following drugs: Marijuana. Frequency: 1.00 time per week. Alcohol:  reports no [name] of alcohol use. Tobacco:  reports that she quit smoking about 46 years ago. Her smoking use included cigarettes. She has never used smokeless tobacco. Social: Carol [name]  reports that she quit smoking about 46 years ago. Her smoking use included cigarettes. She has never used smokeless tobacco. She reports that she does not currently use drugs after having used  the following drugs: Marijuana. Frequency: 1.00 time per week. She  reports that she does not drink alcohol. Medical:  has a past medical [name] of Anxiety, Arthritis, COVID-19 (11/2020), Depression, Headache, Hypertension, Multilevel degenerative disc disease, Positive skin test for tuberculosis, Wears contact lenses, Wears dentures, and Wears hearing aid in right ear. Surgical: Carol [name]  has a past surgical [name] that includes Abdominal hysterectomy; Tonsillectomy; Breast [name] (Right); Tubal ligation; Myringotomy with tube placement (Right); Hand tendon [name] (Right, 2016); Lumbar fusion; Joint replacement (Right); Back [name]; Ankle reconstruction (Left, 10/30/2019); Ankle arthroscopy (Left, 10/30/2019); Cataract extraction w/PHACO (Left, 01/03/2021); Cataract extraction w/PHACO (Right, 01/17/2021); and Posterior cervical fusion/foraminotomy (N/A, 12/26/2021). Family: family [name] includes Aneurysm in her mother; Heart disease in her mother; Hyperlipidemia in her brother and mother; Hypertension in her brother and mother; Lung cancer in her brother.  Laboratory Chemistry Profile   Renal Lab Results  Component Value Date   BUN 14 01/02/2022   CREATININE 0.72 01/02/2022   BCR 18 07/19/2016   GFRAA >60 09/24/2019   GFRNONAA >60 01/02/2022     Hepatic Lab Results  Component Value Date   AST 30 01/02/2022   ALT 32 01/02/2022   ALBUMIN 3.4 (L) 01/02/2022   ALKPHOS 61 01/02/2022   LIPASE 47 07/06/2020     Electrolytes Lab Results  Component Value Date   NA 138 01/02/2022   K 3.2 (L) 01/02/2022   CL 96 (L) 01/02/2022   CALCIUM 9.3 01/02/2022   MG 1.8 07/06/2020     Bone Lab Results  Component Value Date   VD25OH 56.6 04/26/2017     Inflammation (CRP: Acute Phase) (ESR: Chronic Phase) Lab Results  Component Value Date   CRP 7.2 (H) 01/02/2022   ESRSEDRATE 63 (H) 01/02/2022   LATICACIDVEN 1.8 01/02/2022       Note: Above Lab results reviewed.  Recent Imaging  Review  ECHOCARDIOGRAM COMPLETE    ECHOCARDIOGRAM REPORT       Patient Name:   Carol [name] Date of Exam: 05/15/2022 Medical Rec #:  [id number]      Height:       63.5 in Accession #:    [id number]     Weight:       187.2 lb Date of Birth:  May 09, 1945      BSA:          1.891 m Patient Age:    76 years       BP:           140/70 mmHg Patient Gender: F              HR:           95 bpm. Exam Location:  [place]  Procedure: 2D Echo, Cardiac Doppler, Color Doppler and Strain Analysis  Indications:    Dyspnea R06.00   [name]:        Patient has no prior [name] of Echocardiogram examinations.                 Risk Factors:Hypertension and Dyslipidemia.   Sonographer:    Louie Boston RDCS Referring Phys: [id number] Antonieta Iba  IMPRESSIONS   1. Left ventricular ejection fraction, by estimation, is 60 to 65%. The left ventricle has normal function. The left ventricle has no regional wall motion abnormalities. There is mild left ventricular hypertrophy. Left ventricular diastolic parameters  are consistent with Grade II diastolic dysfunction (pseudonormalization).  2. Right ventricular systolic function is normal. The right ventricular size is normal. There is normal pulmonary artery systolic pressure.  3. The  mitral valve is normal in structure. No evidence of mitral valve regurgitation.  4. The aortic valve is grossly normal. Aortic valve regurgitation is not visualized.  5. The inferior vena cava is normal in size with greater than 50% respiratory variability, suggesting right atrial pressure of 3 mmHg.  FINDINGS  Left Ventricle: Left ventricular ejection fraction, by estimation, is 60 to 65%. The left ventricle has normal function. The left ventricle has no regional wall motion abnormalities. Global longitudinal strain performed but not reported based on  interpreter judgement due to suboptimal tracking. The left ventricular internal cavity size was normal in size. There is mild left  ventricular hypertrophy. Left ventricular diastolic parameters are consistent with Grade II diastolic dysfunction  (pseudonormalization).  Right Ventricle: The right ventricular size is normal. No increase in right ventricular wall thickness. Right ventricular systolic function is normal. There is normal pulmonary artery systolic pressure. The tricuspid regurgitant velocity is 1.68 m/s, and  with an assumed right atrial pressure of 3 mmHg, the estimated right ventricular systolic pressure is 14.3 mmHg.  Left Atrium: Left atrial size was normal in size.  Right Atrium: Right atrial size was normal in size.  Pericardium: There is no evidence of pericardial effusion.  Mitral Valve: The mitral valve is normal in structure. No evidence of mitral valve regurgitation.  Tricuspid Valve: The tricuspid valve is normal in structure. Tricuspid valve regurgitation is not demonstrated.  Aortic Valve: The aortic valve is grossly normal. Aortic valve regurgitation is not visualized.  Pulmonic Valve: The pulmonic valve was normal in structure. Pulmonic valve regurgitation is not visualized.  Aorta: The aortic root is normal in size and structure.  Venous: The inferior vena cava is normal in size with greater than 50% respiratory variability, suggesting right atrial pressure of 3 mmHg.  IAS/Shunts: No atrial level shunt detected by color flow Doppler.    LEFT VENTRICLE PLAX 2D LVIDd:         4.50 cm   Diastology LVIDs:         2.90 cm   LV e' medial:    3.81 cm/s LV PW:         1.10 cm   LV E/e' medial:  23.0 LV IVS:        1.10 cm   LV e' lateral:   4.03 cm/s LVOT diam:     2.10 cm   LV E/e' lateral: 21.7 LV SV:         62 LV SV Index:   33 LVOT Area:     3.46 cm    RIGHT VENTRICLE             IVC RV S prime:     11.90 cm/s  IVC diam: 1.10 cm TAPSE (M-mode): 1.7 cm  LEFT ATRIUM             Index        RIGHT ATRIUM          Index LA diam:        2.90 cm 1.53 cm/m   RA Area:     9.13  cm LA Vol (A2C):   29.2 ml 15.44 ml/m  RA Volume:   15.50 ml 8.20 ml/m LA Vol (A4C):   24.7 ml 13.06 ml/m LA Biplane Vol: 27.1 ml 14.33 ml/m  AORTIC VALVE LVOT Vmax:   95.80 cm/s LVOT Vmean:  63.800 cm/s LVOT VTI:    0.180 m   AORTA Ao Root diam: 3.50 cm Ao Asc diam:  3.20 cm Ao Desc diam: 2.20 cm  MITRAL VALVE                TRICUSPID VALVE MV Area (PHT): 3.74 cm     TR Peak grad:   11.3 mmHg MV Decel Time: 203 msec     TR Vmax:        168.00 cm/s MV E velocity: 87.50 cm/s MV A velocity: 124.00 cm/s  SHUNTS MV E/A ratio:  0.71         Systemic VTI:  0.18 m                             Systemic Diam: 2.10 cm  Carol Odea MD Electronically signed by Carol Odea MD Signature Date/Time: 05/15/2022/3:49:36 PM      Final   Note: Reviewed        Physical Exam  General appearance: Well nourished, well developed, and well hydrated. In no apparent acute distress Mental status: Alert, oriented x 3 (person, place, & time)       Respiratory: No evidence of acute respiratory distress Eyes: PERLA Vitals: BP 133/69   Pulse 85   Temp 98.2 F (36.8 C) (Temporal)   Resp 18   Ht 5\' 3"  (1.6 m)   Wt 187 lb (84.8 kg)   SpO2 100%   BMI 33.13 kg/m  BMI: Estimated body mass index is 33.13 kg/m as calculated from the following:   Height as of this encounter: 5\' 3"  (1.6 m).   Weight as of this encounter: 187 lb (84.8 kg). Ideal: Ideal body weight: 52.4 kg (115 lb 8.3 oz) Adjusted ideal body weight: 65.4 kg (144 lb 1.8 oz)  Cervical Spine Area Exam  Skin & Axial Inspection: No masses, redness, edema, swelling, or associated skin lesions Alignment: Symmetrical Functional ROM: Pain restricted ROM, bilaterally Stability: No instability detected Muscle Tone/Strength: Functionally intact. No obvious neuro-muscular anomalies detected. Sensory (Neurological): Dermatomal pain pattern Palpation: No palpable anomalies             Upper Extremity (UE) Exam    Side: Right upper  extremity  Side: Left upper extremity  Skin & Extremity Inspection: Skin color, temperature, and hair growth are WNL. No peripheral edema or cyanosis. No masses, redness, swelling, asymmetry, or associated skin lesions. No contractures.  Skin & Extremity Inspection: Skin color, temperature, and hair growth are WNL. No peripheral edema or cyanosis. No masses, redness, swelling, asymmetry, or associated skin lesions. No contractures.  Functional ROM: Unrestricted ROM          Functional ROM: Unrestricted ROM          Muscle Tone/Strength: Functionally intact. No obvious neuro-muscular anomalies detected.  Muscle Tone/Strength: Functionally intact. No obvious neuro-muscular anomalies detected.  Sensory (Neurological): Dermatomal pain pattern          Sensory (Neurological): Dermatomal pain pattern          Palpation: No palpable anomalies              Palpation: No palpable anomalies              Provocative Test(s):  Phalen's test: deferred Tinel's test: deferred Apley's scratch test (touch opposite shoulder):  Action 1 (Across chest): deferred Action 2 (Overhead): deferred Action 3 (LB reach): deferred   Provocative Test(s):  Phalen's test: deferred Tinel's test: deferred Apley's scratch test (touch opposite shoulder):  Action 1 (Across chest): deferred Action 2 (Overhead): deferred Action 3 (  LB reach): deferred     Lumbar Spine Area Exam  Skin & Axial Inspection: Well healed scar from previous spine [name] detected Alignment: Symmetrical Functional ROM: Pain restricted ROM       Stability: No instability detected Muscle Tone/Strength: Functionally intact. No obvious neuro-muscular anomalies detected. Sensory (Neurological): Dermatomal pain pattern  Gait & Posture Assessment  Ambulation: Limited Gait: Antalgic Posture: Difficulty standing up straight, due to pain    Lower Extremity Exam    Side: Right lower extremity  Side: Left lower extremity  Stability: No instability  observed          Stability: No instability observed          Skin & Extremity Inspection: Skin color, temperature, and hair growth are WNL. No peripheral edema or cyanosis. No masses, redness, swelling, asymmetry, or associated skin lesions. No contractures.  Skin & Extremity Inspection: Skin color, temperature, and hair growth are WNL. No peripheral edema or cyanosis. No masses, redness, swelling, asymmetry, or associated skin lesions. No contractures.  Functional ROM: Pain restricted ROM for all joints of the lower extremity          Functional ROM: Pain restricted ROM for all joints of the lower extremity          Muscle Tone/Strength: Functionally intact. No obvious neuro-muscular anomalies detected.  Muscle Tone/Strength: Functionally intact. No obvious neuro-muscular anomalies detected.  Sensory (Neurological): Neurogenic pain pattern        Sensory (Neurological): Neurogenic pain pattern        DTR: Patellar: deferred today Achilles: deferred today Plantar: deferred today  DTR: Patellar: deferred today Achilles: deferred today Plantar: deferred today  Palpation: No palpable anomalies  Palpation: No palpable anomalies    Assessment   Status Diagnosis  Persistent Persistent Persistent 1. Lumbar radiculopathy   2. Cervical disc disorder with radiculopathy of cervical region   3. Pseudoarthrosis of cervical spine, sequela   4. Chronic radicular lumbar pain   5. Lumbar spondylosis   6. SI joint arthritis   7. Chronic pain syndrome       Plan of Care  Carol [name] has a current medication list which includes the following long-term medication(s): calcium carbonate-vitamin d, chlorthalidone, fluticasone, sumatriptan, and venlafaxine xr.  Orders:  No orders of the defined types were placed in this encounter.  Requested Prescriptions   Signed Prescriptions Disp Refills   HYDROcodone-acetaminophen (NORCO) 10-325 MG tablet 90 tablet 0    Sig: Take 1 tablet by mouth every  8 (eight) hours as needed for severe pain. Must last 30 days.  Encouraged her to take gabapentin consistently, 100 mg 3 times a day. Encouraged her to continue with physical therapy exercises at home that she has learned  PMP checked and reviewed.   Follow-up plan:   Return for patient will call to schedule F2F appt prn.     Status post caudal ESI on 09/01/2019: Helped significantly, repeat as needed.  Interlaminar window seem appropriate for spinal cord stimulator trial    Recent Visits No visits were found meeting these conditions. Showing recent visits within past 90 days and meeting all other requirements Today's Visits Date Type Provider Dept  08/10/22 Office Visit Carol Jolly, MD [facility]  Showing today's visits and meeting all other requirements Future Appointments No visits were found meeting these conditions. Showing future appointments within next 90 days and meeting all other requirements  I discussed the assessment and treatment plan with the patient. The patient was provided  an opportunity to ask questions and all were answered. The patient agreed with the plan and demonstrated an understanding of the instructions.  Patient advised to call back or seek an in-person evaluation if the symptoms or condition worsens.  Duration of encounter:30 minutes.  Note by: Carol Jolly, MD Date: 08/10/2022; Time: 3:41 PM

## 2022-08-10 NOTE — Progress Notes (Signed)
Safety precautions to be maintained throughout the outpatient stay will include: orient to surroundings, keep bed in low position, maintain call bell within reach at all times, provide assistance with transfer out of bed and ambulation.   Nursing Pain Medication Assessment:  Safety precautions to be maintained throughout the outpatient stay will include: orient to surroundings, keep bed in low position, maintain call bell within reach at all times, provide assistance with transfer out of bed and ambulation.  Medication Inspection Compliance: Pill count conducted under aseptic conditions, in front of the patient. Neither the pills nor the bottle was removed from the patient's sight at any time. Once count was completed pills were immediately returned to the patient in their original bottle.  Medication: Tramadol (Ultram) Pill/Patch Count:  0 of 180 pills remain Pill/Patch Appearance: Markings consistent with prescribed medication Bottle Appearance: Standard pharmacy container. Clearly labeled. Filled Date: 09 / 26 / 2023 Last Medication intake:  Yesterday

## 2022-09-18 ENCOUNTER — Ambulatory Visit: Payer: Medicare HMO | Admitting: Bariatrics

## 2022-10-02 ENCOUNTER — Ambulatory Visit: Payer: Medicare HMO | Admitting: Bariatrics

## 2022-11-30 ENCOUNTER — Other Ambulatory Visit: Payer: Self-pay | Admitting: Nurse Practitioner

## 2022-11-30 DIAGNOSIS — Z1231 Encounter for screening mammogram for malignant neoplasm of breast: Secondary | ICD-10-CM

## 2022-12-28 ENCOUNTER — Other Ambulatory Visit: Payer: Self-pay

## 2022-12-28 ENCOUNTER — Telehealth: Payer: Self-pay

## 2022-12-28 DIAGNOSIS — Z1211 Encounter for screening for malignant neoplasm of colon: Secondary | ICD-10-CM

## 2022-12-28 MED ORDER — NA SULFATE-K SULFATE-MG SULF 17.5-3.13-1.6 GM/177ML PO SOLN
1.0000 | Freq: Once | ORAL | 0 refills | Status: AC
Start: 2022-12-28 — End: 2022-12-28

## 2022-12-28 NOTE — Telephone Encounter (Signed)
Gastroenterology Pre-Procedure Review  Request Date: 02/13/23 Requesting Physician: Dr. Allegra Lai  PATIENT REVIEW QUESTIONS: The patient responded to the following health history questions as indicated:    1. Are you having any GI issues?  Constipation diarrhea has seen Dr. Allegra Lai in the past she said she has been taking her miralax and it does help.  No office visit requested.  Chart reviewed. Dr. Verdis Prime office visit note dated 12/23/20 noted "screening colonoscopy 2024".  2. Do you have a personal history of Polyps? no 3. Do you have a family history of Colon Cancer or Polyps? no 4. Diabetes Mellitus? no 5. Joint replacements in the past 12 months?no 6. Major health problems in the past 3 months?no 7. Any artificial heart valves, MVP, or defibrillator?no    MEDICATIONS & ALLERGIES:    Patient reports the following regarding taking any anticoagulation/antiplatelet therapy:   Plavix, Coumadin, Eliquis, Xarelto, Lovenox, Pradaxa, Brilinta, or Effient? no Aspirin? no  Patient confirms/reports the following medications:  Current Outpatient Medications  Medication Sig Dispense Refill   albuterol (VENTOLIN HFA) 108 (90 Base) MCG/ACT inhaler SMARTSIG:2 Inhalation Via Inhaler Every 6 Hours PRN     Calcium Carbonate-Vitamin D (CALTRATE 600+D PO) Take 1 tablet by mouth daily.      cephALEXin (KEFLEX) 500 MG capsule Take 1 capsule (500 mg total) by mouth 3 (three) times daily. 21 capsule 0   cetirizine (ZYRTEC) 10 MG tablet Take 10 mg by mouth daily.     chlorthalidone (HYGROTON) 50 MG tablet Take 50 mg by mouth daily.     Cholecalciferol (VITAMIN D) 125 MCG (5000 UT) CAPS Take 5,000 Units by mouth daily.     clonazePAM (KLONOPIN) 0.5 MG tablet Take 0.5 mg by mouth daily as needed for anxiety.      cyclobenzaprine (FLEXERIL) 10 MG tablet Take 1 tablet (10 mg total) by mouth 3 (three) times daily as needed for muscle spasms. 30 tablet 0   fluticasone (FLONASE) 50 MCG/ACT nasal spray Place 2 sprays  into both nostrils daily as needed for allergies.      gabapentin (NEURONTIN) 100 MG capsule Take 100 mg by mouth 3 (three) times daily.     ondansetron (ZOFRAN-ODT) 4 MG disintegrating tablet Take 1 tablet by translingual route every 6 hours as needed for nausea. Place on top of the tongue where it will dissolve, then swallow.     potassium chloride SA (KLOR-CON M) 20 MEQ tablet Take 20 mEq by mouth 2 (two) times daily.     pyridOXINE (VITAMIN B-6) 100 MG tablet Take 100 mg by mouth daily.     rosuvastatin (CRESTOR) 5 MG tablet Take 1 tablet by mouth daily.     Sod Fluoride-Potassium Nitrate (SODIUM FLUORIDE 5000 SENSITIVE) 1.1-5 % GEL      sulfamethoxazole-trimethoprim (BACTRIM DS) 800-160 MG tablet Take 1 tablet by mouth 2 (two) times daily. 14 tablet 0   SUMAtriptan (IMITREX) 100 MG tablet Take 1 tablet (100 mg total) by mouth daily as needed for migraine. May repeat in 2 hours if headache persists or recurs. 10 tablet 2   traMADol (ULTRAM) 50 MG tablet Take 1-2 tablets (50-100 mg total) by mouth every 8 (eight) hours as needed. 180 tablet 0   venlafaxine XR (EFFEXOR-XR) 150 MG 24 hr capsule Take 150 mg by mouth daily.     No current facility-administered medications for this visit.    Patient confirms/reports the following allergies:  Allergies  Allergen Reactions   Gentamicin Rash, Itching and Other (See Comments)  Celecoxib Other (See Comments)    Achy joints   Hydralazine Nausea And Vomiting   Ibandronic Acid Other (See Comments)    cracked teeth and joint pain Boniva   Oxycodone Hcl Itching    Can take with benadryl   Penicillins Hives and Other (See Comments)    Has patient had a PCN reaction causing immediate rash, facial/tongue/throat swelling, SOB or lightheadedness with hypotension: No Has patient had a PCN reaction causing severe rash involving mucus membranes or skin necrosis: Yes Has patient had a PCN reaction that required hospitalization No Has patient had a PCN  reaction occurring within the last 10 years: No If all of the above answers are "NO", then may proceed with Cephalosporin use.     No orders of the defined types were placed in this encounter.   AUTHORIZATION INFORMATION Primary Insurance: 1D#: Group #:  Secondary Insurance: 1D#: Group #:  SCHEDULE INFORMATION: Date: 02/13/23 Time: Location: [facility]

## 2023-01-09 DIAGNOSIS — M1812 Unilateral primary osteoarthritis of first carpometacarpal joint, left hand: Secondary | ICD-10-CM | POA: Insufficient documentation

## 2023-02-07 ENCOUNTER — Telehealth: Payer: Self-pay | Admitting: Gastroenterology

## 2023-02-07 NOTE — Telephone Encounter (Signed)
R/S by Selena Batten at [facility] to 04/03/2023. Instructions updated referral updated.  Thanks,  [place], [place]

## 2023-02-07 NOTE — Telephone Encounter (Signed)
Patient called in to reschedule her for something next year. She is moving unto a new house at this time.

## 2023-03-29 ENCOUNTER — Encounter: Payer: Self-pay | Admitting: Anesthesiology

## 2023-03-29 NOTE — Anesthesia Preprocedure Evaluation (Signed)
 Anesthesia Evaluation    Airway        Dental   Pulmonary former smoker          Cardiovascular hypertension,      Neuro/Psych    GI/Hepatic   Endo/Other    Renal/GU      Musculoskeletal   Abdominal   Peds  Hematology   Anesthesia Other Findings Positive skin test for tuberculosis Depression Anxiety  Hypertension Arthritis  Wears contact lenses Wears hearing aid in right ear  Wears dentures Headache  Multilevel degenerative disc disease COVID-19     Reproductive/Obstetrics                              Anesthesia Physical Anesthesia Plan  ASA: 2  Anesthesia Plan: General   Post-op Pain Management:    Induction: Intravenous  PONV Risk Score and Plan:   Airway Management Planned: Natural Airway and Nasal Cannula  Additional Equipment:   Intra-op Plan:   Post-operative Plan:   Informed Consent: I have reviewed the patients History and Physical, chart, labs and discussed the procedure including the risks, benefits and alternatives for the proposed anesthesia with the patient or authorized representative who has indicated his/her understanding and acceptance.     Dental Advisory Given  Plan Discussed with: Anesthesiologist, CRNA and Surgeon  Anesthesia Plan Comments: (Patient consented for risks of anesthesia including but not limited to:  - adverse reactions to medications - risk of airway placement if required - damage to eyes, teeth, lips or other oral mucosa - nerve damage due to positioning  - sore throat or hoarseness - Damage to heart, brain, nerves, lungs, other parts of body or loss of life  Patient voiced understanding and assent.)         Anesthesia Quick Evaluation

## 2023-04-03 ENCOUNTER — Ambulatory Visit: Admission: RE | Admit: 2023-04-03 | Payer: Medicare Other | Source: Home / Self Care | Admitting: Gastroenterology

## 2023-04-03 ENCOUNTER — Encounter: Admission: RE | Payer: Self-pay | Source: Home / Self Care

## 2023-04-03 SURGERY — COLONOSCOPY WITH PROPOFOL
Anesthesia: Choice

## 2023-07-17 ENCOUNTER — Telehealth: Payer: Self-pay | Admitting: Cardiovascular Disease

## 2023-07-17 NOTE — Telephone Encounter (Signed)
 Patient has moved...Carol AasAaron AasAaron Aasno longer needs to see Dr. Gollan

## 2023-09-13 ENCOUNTER — Other Ambulatory Visit: Payer: Self-pay | Admitting: Neurosurgery

## 2023-09-17 ENCOUNTER — Other Ambulatory Visit: Payer: Self-pay

## 2023-09-17 DIAGNOSIS — M4316 Spondylolisthesis, lumbar region: Secondary | ICD-10-CM | POA: Insufficient documentation

## 2023-09-17 DIAGNOSIS — S32009K Unspecified fracture of unspecified lumbar vertebra, subsequent encounter for fracture with nonunion: Secondary | ICD-10-CM | POA: Insufficient documentation

## 2023-10-26 ENCOUNTER — Other Ambulatory Visit: Payer: Self-pay

## 2023-11-01 ENCOUNTER — Ambulatory Visit: Attending: Vascular Surgery | Admitting: Vascular Surgery

## 2023-11-01 ENCOUNTER — Encounter: Payer: Self-pay | Admitting: Vascular Surgery

## 2023-11-01 VITALS — BP 122/78 | HR 84 | Temp 98.0°F | Resp 20 | Ht 63.0 in | Wt 182.6 lb

## 2023-11-01 DIAGNOSIS — M5387 Other specified dorsopathies, lumbosacral region: Secondary | ICD-10-CM | POA: Diagnosis not present

## 2023-11-01 NOTE — Progress Notes (Signed)
 Office Note     CC: L5-S1 ALIF Requesting Provider: Dr. Victory Boers.  HPI: Carol [name] is a 78 y.o. ([birth year]) female presenting at the request of Dr. Victory Boers for L5-S1 ALIF evaluation.  She presents today with longstanding history of bilateral lower extremity sciatica and compression of the L5-S1 disc space.  On exam, Carol [name] was doing well.  A native of [place], she lived in [place] prior to moving to [place] recently.  She has been very happy with this move.  Carol [name] has had multiple musculoskeletal procedures with Dr. Boers due to degenerative disc disease. She denies history of abdominal surgery.  Notes prior sclerotherapy for telangiectasias on the left leg.  Her main complaint was left lower extremity edema which she states waxes and wanes.  She wonders if there is a venous component to this.    Past Medical History:  Diagnosis Date   Anxiety    Arthritis    COVID-19 11/2020   symptom free since 12/08/20   Depression    Headache    migraine - last one 2016   Hypertension    reason that she was given [place]  & she remarks that her BP has been better she has been taking it.    Multilevel degenerative disc disease    Positive skin test for tuberculosis    told that she is a carrier, (treated age 76 and as adult) Negative x-ray(per pt)   Wears contact lenses    Wears dentures    partial upper   Wears hearing aid in right ear     Past Surgical History:  Procedure Laterality Date   ABDOMINAL HYSTERECTOMY     ANKLE ARTHROSCOPY Left 10/30/2019   Procedure: A-SCOPE/DEBRIDEMENT, EXTENSIVE, LEFT ANKLE;  Surgeon: Ashley Soulier, DPM;  Location: [facility];  Service: Podiatry;  Laterality: Left;   ANKLE RECONSTRUCTION Left 10/30/2019   Procedure: BROSTRUM-GOULD LEFT ANKLE;  Surgeon: Ashley Soulier, DPM;  Location: [facility];  Service: Podiatry;  Laterality: Left;   BACK SURGERY     x3 previous cervical fusion & then removed hardware    BREAST SURGERY Right    benign-     CATARACT EXTRACTION W/PHACO Left 01/03/2021   Procedure: CATARACT EXTRACTION PHACO AND INTRAOCULAR LENS PLACEMENT (IOC) LEFT;  Surgeon: Myrna Adine Anes, MD;  Location: [facility];  Service: Ophthalmology;  Laterality: Left;  7.32 0:46.3   CATARACT EXTRACTION W/PHACO Right 01/17/2021   Procedure: CATARACT EXTRACTION PHACO AND INTRAOCULAR LENS PLACEMENT (IOC) RIGHT;  Surgeon: Myrna Adine Anes, MD;  Location: [facility];  Service: Ophthalmology;  Laterality: Right;  5.09 0:32.9   HAND TENDON SURGERY Right 2016   JOINT REPLACEMENT Right    also had a partial on the L knee   LUMBAR FUSION     MYRINGOTOMY WITH TUBE PLACEMENT Right    POSTERIOR CERVICAL FUSION/FORAMINOTOMY N/A 12/26/2021   Procedure: Posterior cervical fusion with lateral mass fixation - C5-C6;  Surgeon: Louis Victory, MD;  Location: [facility];  Service: Neurosurgery;  Laterality: N/A;   TONSILLECTOMY     TUBAL LIGATION     had a 2nd. surgery for adhesions    Social History   Socioeconomic History   Marital status: Divorced    Spouse name: Not on file   Number of children: Not on file   Years of education: Not on file   Highest education level: Not on file  Occupational History   Not on file  Tobacco Use   Smoking status: Former  Current packs/day: 0.00    Types: Cigarettes    Quit date: 03/15/1976    Years since quitting: 47.6   Smokeless tobacco: Never  Vaping Use   Vaping status: Never Used  Substance and Sexual Activity   Alcohol  use: No   Drug use: Not Currently    Frequency: 1.0 times per week    Comment: occasionally    Sexual activity: Not on file  Other Topics Concern   Not on file  Social History Narrative   Not on file   Social Drivers of Health   Financial Resource Strain: Low Risk  (11/29/2022)   Received from [facility]   Overall Financial Resource Strain (CARDIA)    Difficulty of Paying Living Expenses: Not hard at all  Food Insecurity: No Food  Insecurity (11/29/2022)   Received from [facility]   Hunger Vital Sign    Within the past 12 months, you worried that your food would run out before you got the money to buy more.: Never true    Within the past 12 months, the food you bought just didn't last and you didn't have money to get more.: Never true  Transportation Needs: No Transportation Needs (11/29/2022)   Received from [facility] - Transportation    In the past 12 months, has lack of transportation kept you from medical appointments or from getting medications?: No    Lack of Transportation (Non-Medical): No  Physical Activity: Not on file  Stress: Not on file  Social Connections: Not on file  Intimate Partner Violence: Not on file   Family History  Problem Relation Age of Onset   Hypertension Mother    Hyperlipidemia Mother    Heart disease Mother    Aneurysm Mother    Hyperlipidemia Brother    Hypertension Brother    Lung cancer Brother     Current Outpatient Medications  Medication Sig Dispense Refill   albuterol (VENTOLIN HFA) 108 (90 Base) MCG/ACT inhaler SMARTSIG:2 Inhalation Via Inhaler Every 6 Hours PRN     albuterol (VENTOLIN HFA) 108 (90 Base) MCG/ACT inhaler Inhale 2 puffs into the lungs.     azelastine (ASTELIN) 0.1 % nasal spray Place 1 spray into the nose.     Calcium  Carbonate-Vitamin D  (CALTRATE 600+D PO) Take 1 tablet by mouth daily.      cephALEXin  (KEFLEX ) 500 MG capsule Take 1 capsule (500 mg total) by mouth 3 (three) times daily. 21 capsule 0   cetirizine  (ZYRTEC ) 10 MG tablet Take 10 mg by mouth daily.     chlorthalidone  (HYGROTON ) 50 MG tablet Take 50 mg by mouth daily.     chlorthalidone  (HYGROTON ) 50 MG tablet Take 1 tablet by mouth daily.     Cholecalciferol  (VITAMIN D ) 125 MCG (5000 UT) CAPS Take 5,000 Units by mouth daily.     clonazePAM  (KLONOPIN ) 0.5 MG tablet Take 0.5 mg by mouth daily as needed for anxiety.      cyclobenzaprine  (FLEXERIL )  10 MG tablet Take 1 tablet (10 mg total) by mouth 3 (three) times daily as needed for muscle spasms. 30 tablet 0   diclofenac Sodium (VOLTAREN) 1 % GEL a small amount to affected area four times a day as needed for pain [1% APPLY TO AFFECTED AREA 4 TIMES DAILY]     fluticasone  (FLONASE ) 50 MCG/ACT nasal spray Place 2 sprays into both nostrils daily as needed for allergies.      gabapentin  (NEURONTIN ) 100 MG  capsule Take 100 mg by mouth 3 (three) times daily.     HYDROcodone -acetaminophen  (NORCO) 10-325 MG tablet Take 1 tablet by mouth 4 (four) times daily as needed.     meloxicam  (MOBIC ) 15 MG tablet Take 15 mg by mouth.     ondansetron  (ZOFRAN -ODT) 4 MG disintegrating tablet Take 1 tablet by translingual route every 6 hours as needed for nausea. Place on top of the tongue where it will dissolve, then swallow.     polyethylene glycol powder (GLYCOLAX /MIRALAX ) 17 GM/SCOOP powder Take 17 g by mouth.     potassium chloride  SA (KLOR-CON  M) 20 MEQ tablet Take 20 mEq by mouth 2 (two) times daily.     pyridOXINE  (VITAMIN B-6) 100 MG tablet Take 100 mg by mouth daily.     rosuvastatin  (CRESTOR ) 5 MG tablet Take 1 tablet by mouth daily.     Sod Fluoride-Potassium Nitrate (SODIUM FLUORIDE 5000 SENSITIVE) 1.1-5 % GEL      sulfamethoxazole -trimethoprim  (BACTRIM  DS) 800-160 MG tablet Take 1 tablet by mouth 2 (two) times daily. 14 tablet 0   SUMAtriptan  (IMITREX ) 100 MG tablet Take 1 tablet (100 mg total) by mouth daily as needed for migraine. May repeat in 2 hours if headache persists or recurs. 10 tablet 2   traMADol  (ULTRAM ) 50 MG tablet Take 50 mg by mouth.     traZODone  (DESYREL ) 50 MG tablet Take 50 mg by mouth.     venlafaxine  XR (EFFEXOR -XR) 150 MG 24 hr capsule Take 150 mg by mouth daily.     venlafaxine  XR (EFFEXOR -XR) 75 MG 24 hr capsule TAKE 1 CAPSULE(75 MG) BY MOUTH DAILY     zoledronic  acid (RECLAST ) 5 MG/100ML SOLN injection Inject 5 mg into the vein.     No current facility-administered  medications for this visit.    Allergies  Allergen Reactions   Gentamicin Itching, Other (See Comments), Rash and Dermatitis   Hydralazine Nausea And Vomiting and Nausea Only   Penicillins Hives and Other (See Comments)    Has patient had a PCN reaction causing immediate rash, facial/tongue/throat swelling, SOB or lightheadedness with hypotension: No  Has patient had a PCN reaction causing severe rash involving mucus membranes or skin necrosis: Yes  Has patient had a PCN reaction that required hospitalization No  Has patient had a PCN reaction occurring within the last 10 years: No  If all of the above answers are NO, then may proceed with Cephalosporin use.  Has patient had a PCN reaction causing immediate rash, facial/tongue/throat swelling, SOB or lightheadedness with hypotension: No Has patient had a PCN reaction causing severe rash involving mucus membranes or skin necrosis: Yes Has patient had a PCN reaction that required hospitalization No Has patient had a PCN reaction occurring within the last 10 years: No If all of the above answers are NO, then may proceed with Cephalosporin use.   Celecoxib Other (See Comments)    Achy joints   Ibandronate Other (See Comments)    cracked teeth and joint pain  Boniva  cracked teeth and joint pain Boniva   Oxycodone  Itching    Can take with benadryl    Oxycodone  Hcl Itching    Can take with benadryl      REVIEW OF SYSTEMS:  [X]  denotes positive finding, [ ]  denotes negative finding Cardiac  Comments:  Chest pain or chest pressure:    Shortness of breath upon exertion:    Short of breath when lying flat:    Irregular heart rhythm:  Vascular    Pain in calf, thigh, or hip brought on by ambulation:    Pain in feet at night that wakes you up from your sleep:     Blood clot in your veins:    Leg swelling:         Pulmonary    Oxygen at home:    Productive cough:     Wheezing:         Neurologic    Sudden weakness in  arms or legs:     Sudden numbness in arms or legs:     Sudden onset of difficulty speaking or slurred speech:    Temporary loss of vision in one eye:     Problems with dizziness:         Gastrointestinal    Blood in stool:     Vomited blood:         Genitourinary    Burning when urinating:     Blood in urine:        Psychiatric    Major depression:         Hematologic    Bleeding problems:    Problems with blood clotting too easily:        Skin    Rashes or ulcers:        Constitutional    Fever or chills:      PHYSICAL EXAMINATION:  Vitals:   11/01/23 1540  BP: 122/78  Pulse: 84  Resp: 20  Temp: 98 F (36.7 C)  TempSrc: Temporal  SpO2: 98%  Weight: 182 lb 9.6 oz (82.8 kg)  Height: 5' 3 (1.6 m)    General:  WDWN in NAD; vital signs documented above Gait: Not observed HENT: WNL, normocephalic Pulmonary: normal non-labored breathing , without wheezing Cardiac: regular HR Abdomen: soft, NT, no masses Skin: without rashes Vascular Exam/Pulses:  Right Left  Radial 2+ (normal) 2+ (normal)  Ulnar    Femoral    Popliteal    DP 2+ (normal) 2+ (normal)  PT     Extremities: without ischemic changes, without Gangrene , without cellulitis; without open wounds;  Musculoskeletal: no muscle wasting or atrophy  Neurologic: A&O X 3;  No focal weakness or paresthesias are detected Psychiatric:  The pt has Normal affect.   Non-Invasive Vascular Imaging:   See MRI    ASSESSMENT/PLAN: GENELLE ECONOMOU is a 78 y.o. female presenting with bilateral lower extremity sciatica with plans for L5-S1 ALIF.  I independently reviewed her MRI, and think that she is a good candidate.  It appears the confluence of the IVC and to the bifurcation of the aorta said much higher. Clarita and I had a long discussion regarding my role in the L5-S1 ALIF, specifically safe exposure of the disc space. We discussed complications such as hemorrhage, death, damage to adjacent structures, hernia  amongst others.  After discussing the risks and benefits, she elected to proceed.  We also discussed the left lower extremity edema.  My plan is to have her back in the office in 6 months once she has recovered from her surgery for venous insufficiency workup.    Fonda FORBES Rim, MD [facility] [phone]

## 2023-11-02 ENCOUNTER — Other Ambulatory Visit: Payer: Self-pay

## 2023-11-02 DIAGNOSIS — I872 Venous insufficiency (chronic) (peripheral): Secondary | ICD-10-CM

## 2023-11-05 NOTE — Pre-Procedure Instructions (Signed)
 Surgical Instructions   Your procedure is scheduled on November 12, 2023. Report to [facility] Main Entrance A at 5:30 A.M., then check in with the Admitting office. Any questions or running late day of surgery: call [phone]  Questions prior to your surgery date: call [phone], Monday-Friday, 8am-4pm. If you experience any cold or flu symptoms such as cough, fever, chills, shortness of breath, etc. between now and your scheduled surgery, please notify us  at the above number.     Remember:  Do not eat or drink after midnight the night before your surgery    Take these medicines the morning of surgery with A SIP OF WATER: cephALEXin  (KEFLEX )  cetirizine  (ZYRTEC )  gabapentin  (NEURONTIN )  rosuvastatin  (CRESTOR )  sulfamethoxazole -trimethoprim  (BACTRIM  DS)  venlafaxine  XR (EFFEXOR -XR)    May take these medicines IF NEEDED: albuterol (VENTOLIN HFA) inhaler - please bring inhaler with you morning of surgery azelastine (ASTELIN) nasal spray  clonazePAM  (KLONOPIN )  cyclobenzaprine  (FLEXERIL )  fluticasone  (FLONASE ) nasal spray  HYDROcodone -acetaminophen  (NORCO)  ondansetron  (ZOFRAN -ODT)  SUMAtriptan  (IMITREX )  traMADol  (ULTRAM )    One week prior to surgery, STOP taking any Aspirin (unless otherwise instructed by your surgeon) Aleve, Naproxen, Ibuprofen , Motrin , Advil , Goody's, BC's, all herbal medications, fish oil, and non-prescription vitamins. This includes your medication: diclofenac Sodium (VOLTAREN) GEL and meloxicam  (MOBIC )                      Do NOT Smoke (Tobacco/Vaping) for 24 hours prior to your procedure.  If you use a CPAP at night, you may bring your mask/headgear for your overnight stay.   You will be asked to remove any contacts, glasses, piercing's, hearing aid's, dentures/partials prior to surgery. Please bring cases for these items if needed.    Patients discharged the day of surgery will not be allowed to drive home, and someone needs to stay with them  for 24 hours.  SURGICAL WAITING ROOM VISITATION Patients may have no more than 2 support people in the waiting area - these visitors may rotate.   Pre-op nurse will coordinate an appropriate time for 1 ADULT support person, who may not rotate, to accompany patient in pre-op.  Children under the age of 28 must have an adult with them who is not the patient and must remain in the main waiting area with an adult.  If the patient needs to stay at the hospital during part of their recovery, the visitor guidelines for inpatient rooms apply.  Please refer to the [facility] website for the visitor guidelines for any additional information.   If you received a COVID test during your pre-op visit  it is requested that you wear a mask when out in public, stay away from anyone that may not be feeling well and notify your surgeon if you develop symptoms. If you have been in contact with anyone that has tested positive in the last 10 days please notify you surgeon.      Pre-operative 5 CHG Bathing Instructions   You can play a key role in reducing the risk of infection after surgery. Your skin needs to be as free of germs as possible. You can reduce the number of germs on your skin by washing with CHG (chlorhexidine  gluconate) soap before surgery. CHG is an antiseptic soap that kills germs and continues to kill germs even after washing.   DO NOT use if you have an allergy to chlorhexidine /CHG or antibacterial soaps. If your skin becomes reddened or irritated, stop using  the CHG and notify one of our RNs at [phone].   Please shower with the CHG soap starting 4 days before surgery using the following schedule:     Please keep in mind the following:  DO NOT shave, including legs and underarms, starting the day of your first shower.   You may shave your face at any point before/day of surgery.  Place clean sheets on your bed the day you start using CHG soap. Use a clean washcloth (not used since  being washed) for each shower. DO NOT sleep with pets once you start using the CHG.   CHG Shower Instructions:  Wash your face and private area with normal soap. If you choose to wash your hair, wash first with your normal shampoo.  After you use shampoo/soap, rinse your hair and body thoroughly to remove shampoo/soap residue.  Turn the water OFF and apply about 3 tablespoons (45 ml) of CHG soap to a CLEAN washcloth.  Apply CHG soap ONLY FROM YOUR NECK DOWN TO YOUR TOES (washing for 3-5 minutes)  DO NOT use CHG soap on face, private areas, open wounds, or sores.  Pay special attention to the area where your surgery is being performed.  If you are having back surgery, having someone wash your back for you may be helpful. Wait 2 minutes after CHG soap is applied, then you may rinse off the CHG soap.  Pat dry with a clean towel  Put on clean clothes/pajamas   If you choose to wear lotion, please use ONLY the CHG-compatible lotions that are listed below.  Additional instructions for the day of surgery: DO NOT APPLY any lotions, deodorants, cologne, or perfumes.   Do not bring valuables to the hospital. [facility] is not responsible for any belongings/valuables. Do not wear nail polish, gel polish, artificial nails, or any other type of covering on natural nails (fingers and toes) Do not wear jewelry or makeup Put on clean/comfortable clothes.  Please brush your teeth.  Ask your nurse before applying any prescription medications to the skin.     CHG Compatible Lotions   Aveeno Moisturizing lotion  Cetaphil Moisturizing Cream  Cetaphil Moisturizing Lotion  Clairol Herbal Essence Moisturizing Lotion, Dry Skin  Clairol Herbal Essence Moisturizing Lotion, Extra Dry Skin  Clairol Herbal Essence Moisturizing Lotion, Normal Skin  Curel Age Defying Therapeutic Moisturizing Lotion with Alpha Hydroxy  Curel Extreme Care Body Lotion  Curel Soothing Hands Moisturizing Hand Lotion  Curel  Therapeutic Moisturizing Cream, Fragrance-Free  Curel Therapeutic Moisturizing Lotion, Fragrance-Free  Curel Therapeutic Moisturizing Lotion, Original Formula  Eucerin Daily Replenishing Lotion  Eucerin Dry Skin Therapy Plus Alpha Hydroxy Crme  Eucerin Dry Skin Therapy Plus Alpha Hydroxy Lotion  Eucerin Original Crme  Eucerin Original Lotion  Eucerin Plus Crme Eucerin Plus Lotion  Eucerin TriLipid Replenishing Lotion  Keri Anti-Bacterial Hand Lotion  Keri Deep Conditioning Original Lotion Dry Skin Formula Softly Scented  Keri Deep Conditioning Original Lotion, Fragrance Free Sensitive Skin Formula  Keri Lotion Fast Absorbing Fragrance Free Sensitive Skin Formula  Keri Lotion Fast Absorbing Softly Scented Dry Skin Formula  Keri Original Lotion  Keri Skin Renewal Lotion Keri Silky Smooth Lotion  Keri Silky Smooth Sensitive Skin Lotion  Nivea Body Creamy Conditioning Oil  Nivea Body Extra Enriched Teacher, adult education Moisturizing Lotion Nivea Crme  Nivea Skin Firming Lotion  NutraDerm 30 Skin Lotion  NutraDerm Skin Lotion  NutraDerm Therapeutic Skin Cream  NutraDerm Therapeutic Skin Lotion  ProShield Protective Hand Cream  Provon moisturizing lotion  Please read over the following fact sheets that you were given.

## 2023-11-06 ENCOUNTER — Encounter (HOSPITAL_COMMUNITY): Payer: Self-pay

## 2023-11-06 ENCOUNTER — Other Ambulatory Visit: Payer: Self-pay

## 2023-11-06 ENCOUNTER — Encounter (HOSPITAL_COMMUNITY)
Admission: RE | Admit: 2023-11-06 | Discharge: 2023-11-06 | Disposition: A | Discharge status: Home / Self Care | Source: Ambulatory Visit | Attending: Neurosurgery | Admitting: Neurosurgery

## 2023-11-06 VITALS — BP 128/67 | HR 84 | Temp 98.3°F | Resp 17 | Ht 63.0 in | Wt 182.4 lb

## 2023-11-06 DIAGNOSIS — I10 Essential (primary) hypertension: Secondary | ICD-10-CM | POA: Diagnosis not present

## 2023-11-06 DIAGNOSIS — E876 Hypokalemia: Secondary | ICD-10-CM | POA: Diagnosis not present

## 2023-11-06 DIAGNOSIS — M4316 Spondylolisthesis, lumbar region: Secondary | ICD-10-CM | POA: Diagnosis not present

## 2023-11-06 DIAGNOSIS — Z87891 Personal history of nicotine dependence: Secondary | ICD-10-CM | POA: Insufficient documentation

## 2023-11-06 DIAGNOSIS — F32A Depression, unspecified: Secondary | ICD-10-CM | POA: Diagnosis not present

## 2023-11-06 DIAGNOSIS — Z96653 Presence of artificial knee joint, bilateral: Secondary | ICD-10-CM | POA: Diagnosis not present

## 2023-11-06 DIAGNOSIS — F419 Anxiety disorder, unspecified: Secondary | ICD-10-CM | POA: Diagnosis not present

## 2023-11-06 DIAGNOSIS — Z01812 Encounter for preprocedural laboratory examination: Secondary | ICD-10-CM | POA: Diagnosis present

## 2023-11-06 DIAGNOSIS — Z9889 Other specified postprocedural states: Secondary | ICD-10-CM | POA: Diagnosis not present

## 2023-11-06 DIAGNOSIS — Z981 Arthrodesis status: Secondary | ICD-10-CM | POA: Diagnosis not present

## 2023-11-06 DIAGNOSIS — Z01818 Encounter for other preprocedural examination: Secondary | ICD-10-CM | POA: Diagnosis not present

## 2023-11-06 DIAGNOSIS — Z01810 Encounter for preprocedural cardiovascular examination: Secondary | ICD-10-CM | POA: Diagnosis present

## 2023-11-06 LAB — CBC
HCT: 45.8 % (ref 36.0–46.0)
Hemoglobin: 15.3 g/dL — ABNORMAL HIGH (ref 12.0–15.0)
MCH: 31 pg (ref 26.0–34.0)
MCHC: 33.4 g/dL (ref 30.0–36.0)
MCV: 92.7 fL (ref 80.0–100.0)
Platelets: 245 K/uL (ref 150–400)
RBC: 4.94 MIL/uL (ref 3.87–5.11)
RDW: 13.9 % (ref 11.5–15.5)
WBC: 9.5 K/uL (ref 4.0–10.5)
nRBC: 0 % (ref 0.0–0.2)

## 2023-11-06 LAB — BASIC METABOLIC PANEL WITH GFR
Anion gap: 12 (ref 5–15)
BUN: 17 mg/dL (ref 8–23)
CO2: 25 mmol/L (ref 22–32)
Calcium: 9.7 mg/dL (ref 8.9–10.3)
Chloride: 100 mmol/L (ref 98–111)
Creatinine, Ser: 0.84 mg/dL (ref 0.44–1.00)
GFR, Estimated: >60 mL/min (ref >60)
Glucose, Bld: 84 mg/dL (ref 70–99)
Potassium: 3.2 mmol/L — ABNORMAL LOW (ref 3.5–5.1)
Sodium: 137 mmol/L (ref 135–145)

## 2023-11-06 LAB — TYPE AND SCREEN
ABO/RH(D): A POS
Antibody Screen: NEGATIVE

## 2023-11-06 LAB — SURGICAL PCR SCREEN
MRSA, PCR: NEGATIVE
Staphylococcus aureus: NEGATIVE

## 2023-11-06 NOTE — Progress Notes (Addendum)
 PCP - Dr. Don, [facility] Cardiologist - Dr. Gollan, [facility] Rheumatologist - Dr. Tobie, [facility]  PPM/ICD - denies Device Orders - n/a Rep Notified -n/a   Chest x-ray - 01/02/2022 EKG - 11/06/2023 Stress Test - denies ECHO - 05/15/2022 Cardiac Cath - denies  Sleep Study - denies CPAP - n/a  Fasting Blood Sugar - no DM Checks Blood Sugar _____ times a day  Last dose of GLP1 agonist-  n/a GLP1 instructions: n/a  Blood Thinner Instructions: n/a Aspirin Instructions: n/a  ERAS Protcol - no, NPO PRE-SURGERY Ensure or G2-n/a   COVID TEST- n/a   Anesthesia review: yes, hx of HTN, EKG review   Patient denies shortness of breath, fever, cough and chest pain at PAT appointment   All instructions explained to the patient, with a verbal understanding of the material. Patient agrees to go over the instructions while at home for a better understanding. Patient also instructed to self quarantine after being tested for COVID-19. The opportunity to ask questions was provided.

## 2023-11-06 NOTE — Pre-Procedure Instructions (Addendum)
 Surgical Instructions     Your procedure is scheduled on November 12, 2023. Report to [facility] Main Entrance A at 5:30 A.M., then check in with the Admitting office. Any questions or running late day of surgery: call [phone]   Questions prior to your surgery date: call [phone], Monday-Friday, 8am-4pm. If you experience any cold or flu symptoms such as cough, fever, chills, shortness of breath, etc. between now and your scheduled surgery, please notify us  at the above number.            Remember:       Do not eat or drink after midnight the night before your surgery          Take these medicines the morning of surgery with A SIP OF WATER: cetirizine  (ZYRTEC )  gabapentin  (NEURONTIN )  rosuvastatin  (CRESTOR )  venlafaxine  XR (EFFEXOR -XR)      May take these medicines IF NEEDED: albuterol (VENTOLIN HFA) inhaler - please bring inhaler with you morning of surgery azelastine (ASTELIN) nasal spray  clonazePAM  (KLONOPIN )  fluticasone  (FLONASE ) nasal spray  HYDROcodone -acetaminophen  (NORCO)  ondansetron  (ZOFRAN -ODT)  SUMAtriptan  (IMITREX )  traMADol  (ULTRAM )      One week prior to surgery, STOP taking any Aspirin (unless otherwise instructed by your surgeon) Aleve, Naproxen, Ibuprofen , Motrin , Advil , Goody's, BC's, all herbal medications, fish oil, and non-prescription vitamins. This includes your medication: diclofenac Sodium (VOLTAREN) GEL and meloxicam  (MOBIC )                      Do NOT Smoke (Tobacco/Vaping) for 24 hours prior to your procedure.   If you use a CPAP at night, you may bring your mask/headgear for your overnight stay.   You will be asked to remove any contacts, glasses, piercing's, hearing aid's, dentures/partials prior to surgery. Please bring cases for these items if needed.    Patients discharged the day of surgery will not be allowed to drive home, and someone needs to stay with them for 24 hours.   SURGICAL WAITING ROOM VISITATION Patients may have  no more than 2 support people in the waiting area - these visitors may rotate.   Pre-op nurse will coordinate an appropriate time for 1 ADULT support person, who may not rotate, to accompany patient in pre-op.  Children under the age of 77 must have an adult with them who is not the patient and must remain in the main waiting area with an adult.   If the patient needs to stay at the hospital during part of their recovery, the visitor guidelines for inpatient rooms apply.   Please refer to the [facility] website for the visitor guidelines for any additional information.     If you received a COVID test during your pre-op visit  it is requested that you wear a mask when out in public, stay away from anyone that may not be feeling well and notify your surgeon if you develop symptoms. If you have been in contact with anyone that has tested positive in the last 10 days please notify you surgeon.         Pre-operative 5 CHG Bathing Instructions    You can play a key role in reducing the risk of infection after surgery. Your skin needs to be as free of germs as possible. You can reduce the number of germs on your skin by washing with CHG (chlorhexidine  gluconate) soap before surgery. CHG is an antiseptic soap that kills germs and continues to kill germs even after  washing.    DO NOT use if you have an allergy to chlorhexidine /CHG or antibacterial soaps. If your skin becomes reddened or irritated, stop using the CHG and notify one of our RNs at [phone].    Please shower with the CHG soap starting 4 days before surgery using the following schedule:       Please keep in mind the following:  DO NOT shave, including legs and underarms, starting the day of your first shower.   You may shave your face at any point before/day of surgery.  Place clean sheets on your bed the day you start using CHG soap. Use a clean washcloth (not used since being washed) for each shower. DO NOT sleep with pets  once you start using the CHG.    CHG Shower Instructions:  Wash your face and private area with normal soap. If you choose to wash your hair, wash first with your normal shampoo.  After you use shampoo/soap, rinse your hair and body thoroughly to remove shampoo/soap residue.  Turn the water OFF and apply about 3 tablespoons (45 ml) of CHG soap to a CLEAN washcloth.  Apply CHG soap ONLY FROM YOUR NECK DOWN TO YOUR TOES (washing for 3-5 minutes)  DO NOT use CHG soap on face, private areas, open wounds, or sores.  Pay special attention to the area where your surgery is being performed.  If you are having back surgery, having someone wash your back for you may be helpful. Wait 2 minutes after CHG soap is applied, then you may rinse off the CHG soap.  Pat dry with a clean towel  Put on clean clothes/pajamas   If you choose to wear lotion, please use ONLY the CHG-compatible lotions that are listed below.   Additional instructions for the day of surgery: DO NOT APPLY any lotions, deodorants, cologne, or perfumes.   Do not bring valuables to the hospital. [facility] is not responsible for any belongings/valuables. Do not wear nail polish, gel polish, artificial nails, or any other type of covering on natural nails (fingers and toes) Do not wear jewelry or makeup Put on clean/comfortable clothes.  Please brush your teeth.  Ask your nurse before applying any prescription medications to the skin.        CHG Compatible Lotions    Aveeno Moisturizing lotion  Cetaphil Moisturizing Cream  Cetaphil Moisturizing Lotion  Clairol Herbal Essence Moisturizing Lotion, Dry Skin  Clairol Herbal Essence Moisturizing Lotion, Extra Dry Skin  Clairol Herbal Essence Moisturizing Lotion, Normal Skin  Curel Age Defying Therapeutic Moisturizing Lotion with Alpha Hydroxy  Curel Extreme Care Body Lotion  Curel Soothing Hands Moisturizing Hand Lotion  Curel Therapeutic Moisturizing Cream, Fragrance-Free  Curel  Therapeutic Moisturizing Lotion, Fragrance-Free  Curel Therapeutic Moisturizing Lotion, Original Formula  Eucerin Daily Replenishing Lotion  Eucerin Dry Skin Therapy Plus Alpha Hydroxy Crme  Eucerin Dry Skin Therapy Plus Alpha Hydroxy Lotion  Eucerin Original Crme  Eucerin Original Lotion  Eucerin Plus Crme Eucerin Plus Lotion  Eucerin TriLipid Replenishing Lotion  Keri Anti-Bacterial Hand Lotion  Keri Deep Conditioning Original Lotion Dry Skin Formula Softly Scented  Keri Deep Conditioning Original Lotion, Fragrance Free Sensitive Skin Formula  Keri Lotion Fast Absorbing Fragrance Free Sensitive Skin Formula  Keri Lotion Fast Absorbing Softly Scented Dry Skin Formula  Keri Original Lotion  Keri Skin Renewal Lotion Keri Silky Smooth Lotion  Keri Silky Smooth Sensitive Skin Lotion  Nivea Body Creamy Conditioning Oil  Nivea Body Extra Enriched Lotion  Anza  Body Original Lotion  Nivea Body Sheer Moisturizing Lotion Nivea Crme  Nivea Skin Firming Lotion  NutraDerm 30 Skin Lotion  NutraDerm Skin Lotion  NutraDerm Therapeutic Skin Cream  NutraDerm Therapeutic Skin Lotion  ProShield Protective Hand Cream  Provon moisturizing lotion   Please read over the following fact sheets that you were given.

## 2023-11-07 NOTE — Progress Notes (Signed)
 Anesthesia Chart Review:  Case: [id number] Date/Time: 11/12/23 0715   Procedures:      ANTERIOR LUMBAR FUSION 1 LEVEL - ALIF - L5-S1 with cage     ABDOMINAL EXPOSURE   Anesthesia type: General   Diagnosis: Spondylolisthesis, lumbar region [M43.16]   Pre-op diagnosis: Spondylolisthesis lumbar region   [facility] ROOM [facility] / [facility]   Surgeons: Louis Shove, MD; Lanis Fonda BRAVO, MD       DISCUSSION: Patient is a 78 year old female scheduled for the above procedure.  History includes former smoker (quit 1977), HTN, +PPD (s/p treatment), anxiety, depression, osteoarthritis (right TKA, left knee hemi-arthroplasty), spinal surgery (C4-5 ACDF; removal C4-5 plate, R4-3 ACDF 07/11/2010; L2-5 PLIF/posterolateral arthrodesis 03/21/2016; C5-6 posterior fusion 12/26/2021), cholecystectomy (10/30/2019), varicose veins (s/p ablation left GSV 2018).   She self referred to cardiologist Dr. Gollan in December 2022 for dyspnea, tremors, tachycardia, family history of CAD. She was not having any chest pain. No significant coronary calcifications and very minimal aortic atherosclerosis on CT imaging. He felt symptoms were likely related to anxiety. No CV testing ordered initially, but he did order a TTE at 03/22/2022 visit for dyspnea (worse in summer heat). 05/15/2022 TTE showed LVEF 60-65%, no RWMA, mild LVH, grade II DD normal RV systolic function, normal PASP, no significant valvular disease. Dr. Gollan reviewed and wrote, Based on echo, no cardiac reasons for the shortness of breath  Would recommend regular exercise program for conditioning. Since then she has moved to [place], [place].    VS: BP 128/67   Pulse 84   Temp 36.8 C   Resp 17   Ht 5' 3 (1.6 m)   Wt 82.7 kg   SpO2 99%   BMI 32.31 kg/m    PROVIDERS: Gauger, Lauraine Collar, NP is PCP  Tobie Lady Plumb, MD is rheumatologist (for osteoarthritis) Perla Lye, MD is cardiologist Lavelle Servant, MD is pulmonologist, last visit 03/08/2022. He  has seen her periodically for over 10 years.  Initially for multiple lung nodules that were benign on PET scan and unchanged for 3 years, so follow-up as needed in 2016.  Later for cough, chronic dyspnea, COPD, allergies, and old right lung nodule from 2019 to 03/08/22. Dyspnea better at that time. CXR showed right mid lung nodules, suspected this was the old known nodule. He planned to have radiologist over read and if any concern could consider a follow-up chest CT.     LABS: Labs reviewed: Acceptable for surgery. (all labs ordered are listed, but only abnormal results are displayed)  Labs Reviewed  CBC - Abnormal; Notable for the following components:      Result Value   Hemoglobin 15.3 (*)    All other components within normal limits  BASIC METABOLIC PANEL WITH GFR - Abnormal; Notable for the following components:   Potassium 3.2 (*)    All other components within normal limits  SURGICAL PCR SCREEN  TYPE AND SCREEN     IMAGES: MRI L-spine 08/01/2023: Images in Canopy/PACS.    EKG: 11/06/2023: NSR   CV: Echo 05/15/2022: IMPRESSIONS   1. Left ventricular ejection fraction, by estimation, is 60 to 65%. The  left ventricle has normal function. The left ventricle has no regional  wall motion abnormalities. There is mild left ventricular hypertrophy.  Left ventricular diastolic parameters  are consistent with Grade II diastolic dysfunction (pseudonormalization).   2. Right ventricular systolic function is normal. The right ventricular  size is normal. There is normal pulmonary artery systolic  pressure.   3. The mitral valve is normal in structure. No evidence of mitral valve  regurgitation.   4. The aortic valve is grossly normal. Aortic valve regurgitation is not  visualized.   5. The inferior vena cava is normal in size with greater than 50%  respiratory variability, suggesting right atrial pressure of 3 mmHg.   Past Medical History:  Diagnosis Date   Anxiety    Arthritis     COVID-19 11/2020   symptom free since 12/08/20   Depression    Hypertension    reason that she was given [place]  & she remarks that her BP has been better she has been taking it.    Multilevel degenerative disc disease    Positive skin test for tuberculosis    told that she is a carrier, (treated age 67 and as adult) Negative x-ray(per pt)   Wears contact lenses    Wears dentures    partial upper   Wears hearing aid in right ear     Past Surgical History:  Procedure Laterality Date   ABDOMINAL HYSTERECTOMY     ANKLE ARTHROSCOPY Left 10/30/2019   Procedure: A-SCOPE/DEBRIDEMENT, EXTENSIVE, LEFT ANKLE;  Surgeon: Ashley Soulier, DPM;  Location: [facility] ORS;  Service: Podiatry;  Laterality: Left;   ANKLE RECONSTRUCTION Left 10/30/2019   Procedure: BROSTRUM-GOULD LEFT ANKLE;  Surgeon: Ashley Soulier, DPM;  Location: [facility] ORS;  Service: Podiatry;  Laterality: Left;   BACK SURGERY     x3 previous cervical fusion & then removed hardware    BREAST SURGERY Right    benign-    CATARACT EXTRACTION W/PHACO Left 01/03/2021   Procedure: CATARACT EXTRACTION PHACO AND INTRAOCULAR LENS PLACEMENT (IOC) LEFT;  Surgeon: Myrna Adine Anes, MD;  Location: [facility];  Service: Ophthalmology;  Laterality: Left;  7.32 0:46.3   CATARACT EXTRACTION W/PHACO Right 01/17/2021   Procedure: CATARACT EXTRACTION PHACO AND INTRAOCULAR LENS PLACEMENT (IOC) RIGHT;  Surgeon: Myrna Adine Anes, MD;  Location: [facility];  Service: Ophthalmology;  Laterality: Right;  5.09 0:32.9   CHOLECYSTECTOMY  2021   HAND TENDON SURGERY Right 2016   JOINT REPLACEMENT Right    also had a partial on the L knee   LUMBAR FUSION     MYRINGOTOMY WITH TUBE PLACEMENT Right    POSTERIOR CERVICAL FUSION/FORAMINOTOMY N/A 12/26/2021   Procedure: Posterior cervical fusion with lateral mass fixation - C5-C6;  Surgeon: Louis Shove, MD;  Location: [facility];  Service: Neurosurgery;  Laterality: N/A;   TONSILLECTOMY     TUBAL  LIGATION     had a 2nd. surgery for adhesions    MEDICATIONS:  albuterol (VENTOLIN HFA) 108 (90 Base) MCG/ACT inhaler   azelastine (ASTELIN) 0.1 % nasal spray   cetirizine  (ZYRTEC ) 10 MG tablet   chlorthalidone  (HYGROTON ) 50 MG tablet   Cholecalciferol  (VITAMIN D3) 50 MCG (2000 UT) capsule   clonazePAM  (KLONOPIN ) 0.5 MG tablet   diclofenac Sodium (VOLTAREN) 1 % GEL   gabapentin  (NEURONTIN ) 100 MG capsule   HYDROcodone -acetaminophen  (NORCO) 10-325 MG tablet   ondansetron  (ZOFRAN -ODT) 4 MG disintegrating tablet   polyethylene glycol powder (GLYCOLAX/MIRALAX) 17 GM/SCOOP powder   potassium chloride  SA (KLOR-CON  M) 20 MEQ tablet   pyridOXINE  (VITAMIN B-6) 100 MG tablet   rizatriptan  (MAXALT ) 10 MG tablet   rosuvastatin  (CRESTOR ) 5 MG tablet   SUMAtriptan  (IMITREX ) 100 MG tablet   traMADol  (ULTRAM ) 50 MG tablet   traZODone (DESYREL) 50 MG tablet   venlafaxine  XR (EFFEXOR -XR) 150 MG 24 hr capsule  venlafaxine  XR (EFFEXOR -XR) 75 MG 24 hr capsule   zoledronic acid (RECLAST) 5 MG/100ML SOLN injection   No current facility-administered medications for this encounter.

## 2023-11-08 NOTE — Anesthesia Preprocedure Evaluation (Addendum)
 Anesthesia Evaluation  Patient identified by MRN, date of birth, ID band Patient awake    Reviewed: Allergy & Precautions, H&P , NPO status , Patient's Chart, lab work & pertinent test results  Airway Mallampati: II   Neck ROM: full    Dental   Pulmonary former smoker   breath sounds clear to auscultation       Cardiovascular hypertension,  Rhythm:regular Rate:Normal     Neuro/Psych  Headaches  Anxiety Depression     Neuromuscular disease    GI/Hepatic   Endo/Other    Renal/GU      Musculoskeletal  (+) Arthritis ,  Fibromyalgia -  Abdominal   Peds  Hematology   Anesthesia Other Findings   Reproductive/Obstetrics                              Anesthesia Physical Anesthesia Plan  ASA: 2  Anesthesia Plan: General   Post-op Pain Management:    Induction: Intravenous  PONV Risk Score and Plan: 3 and Ondansetron , Dexamethasone  and Treatment may vary due to age or medical condition  Airway Management Planned: Oral ETT  Additional Equipment:   Intra-op Plan:   Post-operative Plan: Extubation in OR  Informed Consent: I have reviewed the patients History and Physical, chart, labs and discussed the procedure including the risks, benefits and alternatives for the proposed anesthesia with the patient or authorized representative who has indicated his/her understanding and acceptance.     Dental advisory given  Plan Discussed with: CRNA, Anesthesiologist and Surgeon  Anesthesia Plan Comments: (PAT note written 11/08/2023 by Allison Zelenak, PA-C.  )         Anesthesia Quick Evaluation

## 2023-11-12 ENCOUNTER — Inpatient Hospital Stay (HOSPITAL_COMMUNITY)

## 2023-11-12 ENCOUNTER — Inpatient Hospital Stay (HOSPITAL_COMMUNITY): Payer: Self-pay | Admitting: Anesthesiology

## 2023-11-12 ENCOUNTER — Encounter (HOSPITAL_COMMUNITY)
Admission: EM | Disposition: A | Discharge status: Home / Self Care | Payer: Self-pay | Source: Home / Self Care | Attending: Neurosurgery

## 2023-11-12 ENCOUNTER — Encounter (HOSPITAL_COMMUNITY): Payer: Self-pay | Admitting: Neurosurgery

## 2023-11-12 ENCOUNTER — Inpatient Hospital Stay (HOSPITAL_COMMUNITY): Payer: Self-pay | Admitting: Vascular Surgery

## 2023-11-12 ENCOUNTER — Other Ambulatory Visit: Payer: Self-pay

## 2023-11-12 ENCOUNTER — Inpatient Hospital Stay (HOSPITAL_COMMUNITY)
Admission: EM | Admit: 2023-11-12 | Discharge: 2023-11-13 | DRG: 451 | Disposition: A | Discharge status: Home / Self Care | Attending: Neurosurgery | Admitting: Neurosurgery

## 2023-11-12 DIAGNOSIS — Z886 Allergy status to analgesic agent status: Secondary | ICD-10-CM

## 2023-11-12 DIAGNOSIS — Z881 Allergy status to other antibiotic agents status: Secondary | ICD-10-CM | POA: Diagnosis not present

## 2023-11-12 DIAGNOSIS — M51379 Other intervertebral disc degeneration, lumbosacral region without mention of lumbar back pain or lower extremity pain: Secondary | ICD-10-CM | POA: Diagnosis present

## 2023-11-12 DIAGNOSIS — M4317 Spondylolisthesis, lumbosacral region: Principal | ICD-10-CM | POA: Diagnosis present

## 2023-11-12 DIAGNOSIS — M5432 Sciatica, left side: Secondary | ICD-10-CM | POA: Diagnosis present

## 2023-11-12 DIAGNOSIS — Z974 Presence of external hearing-aid: Secondary | ICD-10-CM | POA: Diagnosis not present

## 2023-11-12 DIAGNOSIS — Z888 Allergy status to other drugs, medicaments and biological substances status: Secondary | ICD-10-CM

## 2023-11-12 DIAGNOSIS — M51372 Other intervertebral disc degeneration, lumbosacral region with discogenic back pain and lower extremity pain: Secondary | ICD-10-CM | POA: Diagnosis not present

## 2023-11-12 DIAGNOSIS — I10 Essential (primary) hypertension: Secondary | ICD-10-CM | POA: Diagnosis present

## 2023-11-12 DIAGNOSIS — Z79899 Other long term (current) drug therapy: Secondary | ICD-10-CM | POA: Diagnosis not present

## 2023-11-12 DIAGNOSIS — M4316 Spondylolisthesis, lumbar region: Secondary | ICD-10-CM | POA: Diagnosis not present

## 2023-11-12 DIAGNOSIS — Z880 Allergy status to penicillin: Secondary | ICD-10-CM

## 2023-11-12 DIAGNOSIS — Z801 Family history of malignant neoplasm of trachea, bronchus and lung: Secondary | ICD-10-CM | POA: Diagnosis not present

## 2023-11-12 DIAGNOSIS — Z8249 Family history of ischemic heart disease and other diseases of the circulatory system: Secondary | ICD-10-CM

## 2023-11-12 DIAGNOSIS — Z885 Allergy status to narcotic agent status: Secondary | ICD-10-CM | POA: Diagnosis not present

## 2023-11-12 DIAGNOSIS — Z83438 Family history of other disorder of lipoprotein metabolism and other lipidemia: Secondary | ICD-10-CM

## 2023-11-12 DIAGNOSIS — Z87891 Personal history of nicotine dependence: Secondary | ICD-10-CM

## 2023-11-12 DIAGNOSIS — Z9049 Acquired absence of other specified parts of digestive tract: Secondary | ICD-10-CM | POA: Diagnosis not present

## 2023-11-12 DIAGNOSIS — M51369 Other intervertebral disc degeneration, lumbar region without mention of lumbar back pain or lower extremity pain: Principal | ICD-10-CM

## 2023-11-12 DIAGNOSIS — M4807 Spinal stenosis, lumbosacral region: Secondary | ICD-10-CM | POA: Diagnosis present

## 2023-11-12 DIAGNOSIS — Z90710 Acquired absence of both cervix and uterus: Secondary | ICD-10-CM | POA: Diagnosis not present

## 2023-11-12 DIAGNOSIS — M5431 Sciatica, right side: Secondary | ICD-10-CM | POA: Diagnosis present

## 2023-11-12 DIAGNOSIS — Z981 Arthrodesis status: Secondary | ICD-10-CM

## 2023-11-12 DIAGNOSIS — Z8616 Personal history of COVID-19: Secondary | ICD-10-CM | POA: Diagnosis not present

## 2023-11-12 HISTORY — PX: ABDOMINAL EXPOSURE: SHX5708

## 2023-11-12 HISTORY — PX: ANTERIOR LUMBAR FUSION: SHX1170

## 2023-11-12 SURGERY — ANTERIOR LUMBAR FUSION 1 LEVEL
Anesthesia: General

## 2023-11-12 MED ORDER — MIDAZOLAM HCL 2 MG/2ML IJ SOLN
INTRAMUSCULAR | Status: AC
  Filled 2023-11-12: qty 2

## 2023-11-12 MED ORDER — HYDROMORPHONE HCL 1 MG/ML IJ SOLN
INTRAMUSCULAR | Status: AC
  Filled 2023-11-12: qty 0.5

## 2023-11-12 MED ORDER — FENTANYL CITRATE (PF) 100 MCG/2ML IJ SOLN
INTRAMUSCULAR | Status: AC
  Filled 2023-11-12: qty 2

## 2023-11-12 MED ORDER — CEFAZOLIN SODIUM-DEXTROSE 2-4 GM/100ML-% IV SOLN
2.0000 g | INTRAVENOUS | Status: AC
  Administered 2023-11-12: 2 g via INTRAVENOUS
  Filled 2023-11-12: qty 100

## 2023-11-12 MED ORDER — SODIUM CHLORIDE 0.9 % IV SOLN
0.1500 ug/kg/min | INTRAVENOUS | Status: AC
  Administered 2023-11-12: .05 ug/kg/min via INTRAVENOUS
  Filled 2023-11-12: qty 2000

## 2023-11-12 MED ORDER — ACETAMINOPHEN 325 MG PO TABS: 650.0000 mg | ORAL_TABLET | ORAL | Status: DC | PRN

## 2023-11-12 MED ORDER — GABAPENTIN 100 MG PO CAPS
100.0000 mg | ORAL_CAPSULE | Freq: Every day | ORAL | Status: DC
  Administered 2023-11-12 – 2023-11-13 (×2): 100 mg via ORAL
  Filled 2023-11-12 (×2): qty 1

## 2023-11-12 MED ORDER — BUPIVACAINE HCL (PF) 0.25 % IJ SOLN
INTRAMUSCULAR | Status: DC | PRN
  Administered 2023-11-12: 20 mL

## 2023-11-12 MED ORDER — LACTATED RINGERS IV SOLN: INTRAVENOUS | Status: DC

## 2023-11-12 MED ORDER — LORATADINE 10 MG PO TABS
10.0000 mg | ORAL_TABLET | Freq: Every day | ORAL | Status: DC
  Administered 2023-11-12 – 2023-11-13 (×2): 10 mg via ORAL
  Filled 2023-11-12 (×2): qty 1

## 2023-11-12 MED ORDER — CHLORHEXIDINE GLUCONATE CLOTH 2 % EX PADS: 6.0000 | MEDICATED_PAD | Freq: Once | CUTANEOUS | Status: DC

## 2023-11-12 MED ORDER — FLEET ENEMA RE ENEM: 1.0000 | ENEMA | Freq: Once | RECTAL | Status: DC | PRN

## 2023-11-12 MED ORDER — ONDANSETRON HCL 4 MG/2ML IJ SOLN
INTRAMUSCULAR | Status: AC
Start: 2023-11-12 — End: 2023-11-12
  Filled 2023-11-12: qty 2

## 2023-11-12 MED ORDER — CEFAZOLIN SODIUM-DEXTROSE 1-4 GM/50ML-% IV SOLN
1.0000 g | Freq: Three times a day (TID) | INTRAVENOUS | Status: AC
  Administered 2023-11-12 (×2): 1 g via INTRAVENOUS
  Filled 2023-11-12 (×2): qty 50

## 2023-11-12 MED ORDER — ROCURONIUM BROMIDE 10 MG/ML (PF) SYRINGE
PREFILLED_SYRINGE | INTRAVENOUS | Status: DC | PRN
  Administered 2023-11-12: 60 mg via INTRAVENOUS
  Administered 2023-11-12: 40 mg via INTRAVENOUS

## 2023-11-12 MED ORDER — 0.9 % SODIUM CHLORIDE (POUR BTL) OPTIME
TOPICAL | Status: DC | PRN
  Administered 2023-11-12: 1000 mL

## 2023-11-12 MED ORDER — PHENYLEPHRINE 80 MCG/ML (10ML) SYRINGE FOR IV PUSH (FOR BLOOD PRESSURE SUPPORT)
PREFILLED_SYRINGE | INTRAVENOUS | Status: AC
  Filled 2023-11-12: qty 20

## 2023-11-12 MED ORDER — PHENYLEPHRINE HCL-NACL 20-0.9 MG/250ML-% IV SOLN
INTRAVENOUS | Status: DC | PRN
  Administered 2023-11-12: 25 ug/min via INTRAVENOUS

## 2023-11-12 MED ORDER — SODIUM CHLORIDE 0.9 % IV SOLN
250.0000 mL | INTRAVENOUS | Status: AC
  Administered 2023-11-12: 250 mL via INTRAVENOUS

## 2023-11-12 MED ORDER — DEXAMETHASONE SODIUM PHOSPHATE 10 MG/ML IJ SOLN
INTRAMUSCULAR | Status: DC | PRN
  Administered 2023-11-12: 10 mg via INTRAVENOUS

## 2023-11-12 MED ORDER — AZELASTINE HCL 0.1 % NA SOLN: 1.0000 | Freq: Every day | NASAL | Status: DC | PRN

## 2023-11-12 MED ORDER — ALBUTEROL SULFATE (2.5 MG/3ML) 0.083% IN NEBU: 3.0000 mL | INHALATION_SOLUTION | Freq: Four times a day (QID) | RESPIRATORY_TRACT | Status: DC | PRN

## 2023-11-12 MED ORDER — GLYCOPYRROLATE PF 0.2 MG/ML IJ SOSY
PREFILLED_SYRINGE | INTRAMUSCULAR | Status: AC
  Filled 2023-11-12: qty 1

## 2023-11-12 MED ORDER — ONDANSETRON HCL 4 MG/2ML IJ SOLN
INTRAMUSCULAR | Status: DC | PRN
  Administered 2023-11-12: 4 mg via INTRAVENOUS

## 2023-11-12 MED ORDER — DEXAMETHASONE SODIUM PHOSPHATE 10 MG/ML IJ SOLN
INTRAMUSCULAR | Status: AC
  Filled 2023-11-12: qty 1

## 2023-11-12 MED ORDER — LIDOCAINE 2% (20 MG/ML) 5 ML SYRINGE
INTRAMUSCULAR | Status: AC
  Filled 2023-11-12: qty 5

## 2023-11-12 MED ORDER — VENLAFAXINE HCL ER 150 MG PO CP24
150.0000 mg | ORAL_CAPSULE | Freq: Every day | ORAL | Status: DC
  Administered 2023-11-12 – 2023-11-13 (×2): 150 mg via ORAL
  Filled 2023-11-12 (×2): qty 1
  Filled 2023-11-12 (×2): qty 2

## 2023-11-12 MED ORDER — VITAMIN B-6 100 MG PO TABS
100.0000 mg | ORAL_TABLET | Freq: Every day | ORAL | Status: DC
  Administered 2023-11-12 – 2023-11-13 (×2): 100 mg via ORAL
  Filled 2023-11-12 (×2): qty 1

## 2023-11-12 MED ORDER — VENLAFAXINE HCL ER 75 MG PO CP24
75.0000 mg | ORAL_CAPSULE | Freq: Every day | ORAL | Status: DC
  Administered 2023-11-13: 75 mg via ORAL
  Filled 2023-11-12: qty 1

## 2023-11-12 MED ORDER — PROPOFOL 10 MG/ML IV BOLUS
INTRAVENOUS | Status: DC | PRN
  Administered 2023-11-12: 40 mg via INTRAVENOUS
  Administered 2023-11-12: 150 mg via INTRAVENOUS

## 2023-11-12 MED ORDER — ROCURONIUM BROMIDE 10 MG/ML (PF) SYRINGE
PREFILLED_SYRINGE | INTRAVENOUS | Status: AC
  Filled 2023-11-12: qty 10

## 2023-11-12 MED ORDER — FENTANYL CITRATE (PF) 250 MCG/5ML IJ SOLN
INTRAMUSCULAR | Status: AC
  Filled 2023-11-12: qty 5

## 2023-11-12 MED ORDER — VITAMIN D3 25 MCG (1000 UNIT) PO TABS
2000.0000 [IU] | ORAL_TABLET | Freq: Every day | ORAL | Status: DC
  Administered 2023-11-12 – 2023-11-13 (×2): 2000 [IU] via ORAL
  Filled 2023-11-12 (×4): qty 2

## 2023-11-12 MED ORDER — DIAZEPAM 5 MG PO TABS
5.0000 mg | ORAL_TABLET | Freq: Four times a day (QID) | ORAL | Status: DC | PRN
  Administered 2023-11-12 – 2023-11-13 (×2): 5 mg via ORAL
  Filled 2023-11-12 (×2): qty 1

## 2023-11-12 MED ORDER — SODIUM CHLORIDE 0.9% FLUSH: 3.0000 mL | INTRAVENOUS | Status: DC | PRN

## 2023-11-12 MED ORDER — ONDANSETRON HCL 4 MG PO TABS: 4.0000 mg | ORAL_TABLET | Freq: Four times a day (QID) | ORAL | Status: DC | PRN

## 2023-11-12 MED ORDER — ORAL CARE MOUTH RINSE: 15.0000 mL | Freq: Once | OROMUCOSAL | Status: AC

## 2023-11-12 MED ORDER — CHLORHEXIDINE GLUCONATE 0.12 % MT SOLN
15.0000 mL | Freq: Once | OROMUCOSAL | Status: AC
  Administered 2023-11-12: 15 mL via OROMUCOSAL
  Filled 2023-11-12: qty 15

## 2023-11-12 MED ORDER — HYDROCODONE-ACETAMINOPHEN 10-325 MG PO TABS
1.0000 | ORAL_TABLET | ORAL | Status: DC | PRN
  Administered 2023-11-12 – 2023-11-13 (×4): 1 via ORAL
  Filled 2023-11-12 (×4): qty 1

## 2023-11-12 MED ORDER — POTASSIUM CHLORIDE CRYS ER 20 MEQ PO TBCR
20.0000 meq | EXTENDED_RELEASE_TABLET | Freq: Two times a day (BID) | ORAL | Status: DC
  Administered 2023-11-12 – 2023-11-13 (×3): 20 meq via ORAL
  Filled 2023-11-12 (×3): qty 1

## 2023-11-12 MED ORDER — PROPOFOL 10 MG/ML IV BOLUS
INTRAVENOUS | Status: AC
  Filled 2023-11-12: qty 20

## 2023-11-12 MED ORDER — THROMBIN 20000 UNITS EX SOLR: CUTANEOUS | Status: DC | PRN

## 2023-11-12 MED ORDER — HYDROMORPHONE HCL 1 MG/ML IJ SOLN: 1.0000 mg | INTRAMUSCULAR | Status: DC | PRN

## 2023-11-12 MED ORDER — PHENYLEPHRINE 80 MCG/ML (10ML) SYRINGE FOR IV PUSH (FOR BLOOD PRESSURE SUPPORT)
PREFILLED_SYRINGE | INTRAVENOUS | Status: DC | PRN
  Administered 2023-11-12 (×2): 80 ug via INTRAVENOUS

## 2023-11-12 MED ORDER — ONDANSETRON HCL 4 MG/2ML IJ SOLN: 4.0000 mg | Freq: Four times a day (QID) | INTRAMUSCULAR | Status: DC | PRN

## 2023-11-12 MED ORDER — LIDOCAINE 2% (20 MG/ML) 5 ML SYRINGE
INTRAMUSCULAR | Status: DC | PRN
  Administered 2023-11-12: 60 mg via INTRAVENOUS

## 2023-11-12 MED ORDER — FENTANYL CITRATE (PF) 100 MCG/2ML IJ SOLN
25.0000 ug | INTRAMUSCULAR | Status: DC | PRN
  Administered 2023-11-12 (×2): 50 ug via INTRAVENOUS

## 2023-11-12 MED ORDER — ROSUVASTATIN CALCIUM 5 MG PO TABS
5.0000 mg | ORAL_TABLET | Freq: Every day | ORAL | Status: DC
  Administered 2023-11-12 – 2023-11-13 (×2): 5 mg via ORAL
  Filled 2023-11-12 (×2): qty 1

## 2023-11-12 MED ORDER — LACTATED RINGERS IV SOLN: INTRAVENOUS | Status: DC | PRN

## 2023-11-12 MED ORDER — POLYETHYLENE GLYCOL 3350 17 G PO PACK
17.0000 g | PACK | Freq: Every day | ORAL | Status: DC | PRN
  Administered 2023-11-12: 17 g via ORAL
  Filled 2023-11-12: qty 1

## 2023-11-12 MED ORDER — PHENOL 1.4 % MT LIQD: 1.0000 | OROMUCOSAL | Status: DC | PRN

## 2023-11-12 MED ORDER — ZOLEDRONIC ACID 5 MG/100ML IV SOLN: 5.0000 mg | INTRAVENOUS | Status: DC

## 2023-11-12 MED ORDER — MIDAZOLAM HCL 2 MG/2ML IJ SOLN
INTRAMUSCULAR | Status: DC | PRN
  Administered 2023-11-12 (×2): 1 mg via INTRAVENOUS

## 2023-11-12 MED ORDER — TRAZODONE HCL 50 MG PO TABS: 50.0000 mg | ORAL_TABLET | Freq: Every evening | ORAL | Status: DC | PRN

## 2023-11-12 MED ORDER — MENTHOL 3 MG MT LOZG: 1.0000 | LOZENGE | OROMUCOSAL | Status: DC | PRN

## 2023-11-12 MED ORDER — SODIUM CHLORIDE 0.9% FLUSH
3.0000 mL | Freq: Two times a day (BID) | INTRAVENOUS | Status: DC
  Administered 2023-11-12 (×2): 3 mL via INTRAVENOUS

## 2023-11-12 MED ORDER — THROMBIN 20000 UNITS EX SOLR
CUTANEOUS | Status: AC
  Filled 2023-11-12: qty 20000

## 2023-11-12 MED ORDER — BUPIVACAINE HCL (PF) 0.25 % IJ SOLN
INTRAMUSCULAR | Status: AC
  Filled 2023-11-12: qty 30

## 2023-11-12 MED ORDER — SUGAMMADEX SODIUM 200 MG/2ML IV SOLN
INTRAVENOUS | Status: DC | PRN
  Administered 2023-11-12: 200 mg via INTRAVENOUS

## 2023-11-12 MED ORDER — SUMATRIPTAN SUCCINATE 100 MG PO TABS: 100.0000 mg | ORAL_TABLET | Freq: Every day | ORAL | Status: DC | PRN

## 2023-11-12 MED ORDER — CLONAZEPAM 0.5 MG PO TABS: 0.5000 mg | ORAL_TABLET | Freq: Every day | ORAL | Status: DC | PRN

## 2023-11-12 MED ORDER — FENTANYL CITRATE (PF) 250 MCG/5ML IJ SOLN
INTRAMUSCULAR | Status: DC | PRN
  Administered 2023-11-12: 150 ug via INTRAVENOUS
  Administered 2023-11-12: 50 ug via INTRAVENOUS

## 2023-11-12 MED ORDER — PROPOFOL 500 MG/50ML IV EMUL
INTRAVENOUS | Status: DC | PRN
Start: 2023-11-12 — End: 2023-11-12
  Administered 2023-11-12: 150 ug/kg/min via INTRAVENOUS

## 2023-11-12 MED ORDER — CHLORTHALIDONE 25 MG PO TABS
50.0000 mg | ORAL_TABLET | Freq: Every day | ORAL | Status: DC
  Administered 2023-11-12 – 2023-11-13 (×2): 50 mg via ORAL
  Filled 2023-11-12 (×2): qty 2

## 2023-11-12 MED ORDER — ACETAMINOPHEN 650 MG RE SUPP: 650.0000 mg | RECTAL | Status: DC | PRN

## 2023-11-12 MED ORDER — BISACODYL 10 MG RE SUPP: 10.0000 mg | Freq: Every day | RECTAL | Status: DC | PRN

## 2023-11-12 MED ORDER — HYDROMORPHONE HCL 1 MG/ML IJ SOLN
INTRAMUSCULAR | Status: DC | PRN
  Administered 2023-11-12: .5 mg via INTRAVENOUS

## 2023-11-12 SURGICAL SUPPLY — 68 items
ANCHOR LUMBAR 25 MIS (Anchor) IMPLANT
BAG COUNTER SPONGE SURGICOUNT (BAG) ×4 IMPLANT
BENZOIN TINCTURE PRP APPL 2/3 (GAUZE/BANDAGES/DRESSINGS) IMPLANT
BONE MATRIX OSTEOCEL PRO LRG (Bone Implant) IMPLANT
BUR MATCHSTICK NEURO 3.0 LAGG (BURR) IMPLANT
CANISTER SUCTION 3000ML PPV (SUCTIONS) ×2 IMPLANT
CLIP APPLIE 11 MED OPEN (CLIP) ×2 IMPLANT
CLIP TI MEDIUM 24 (CLIP) ×2 IMPLANT
CLIP TI MEDIUM 6 (CLIP) ×2 IMPLANT
CLIP TI WIDE RED SMALL 24 (CLIP) ×2 IMPLANT
CLIP TI WIDE RED SMALL 6 (CLIP) ×2 IMPLANT
DERMABOND ADVANCED .7 DNX12 (GAUZE/BANDAGES/DRESSINGS) IMPLANT
DRAPE C-ARM 42X72 X-RAY (DRAPES) ×6 IMPLANT
DRAPE C-ARMOR (DRAPES) ×2 IMPLANT
DRAPE LAPAROTOMY 100X72X124 (DRAPES) ×2 IMPLANT
DRSG OPSITE POSTOP 4X6 (GAUZE/BANDAGES/DRESSINGS) IMPLANT
ELECT BLADE 6.5 EXT (BLADE) ×2 IMPLANT
ELECTRODE BLDE 4.0 EZ CLN MEGD (MISCELLANEOUS) ×2 IMPLANT
ELECTRODE REM PT RTRN 9FT ADLT (ELECTROSURGICAL) ×2 IMPLANT
GAUZE 4X4 16PLY ~~LOC~~+RFID DBL (SPONGE) IMPLANT
GAUZE SPONGE 4X4 12PLY STRL (GAUZE/BANDAGES/DRESSINGS) ×2 IMPLANT
GLOVE BIO SURGEON STRL SZ 6 (GLOVE) ×2 IMPLANT
GLOVE BIOGEL PI IND STRL 6.5 (GLOVE) ×2 IMPLANT
GLOVE BIOGEL PI IND STRL 8 (GLOVE) ×2 IMPLANT
GLOVE ECLIPSE 9.0 STRL (GLOVE) ×2 IMPLANT
GLOVE EXAM NITRILE XL STR (GLOVE) IMPLANT
GLOVE SS BIOGEL STRL SZ 7.5 (GLOVE) ×2 IMPLANT
GOWN STRL REUS W/ TWL LRG LVL3 (GOWN DISPOSABLE) ×2 IMPLANT
GOWN STRL REUS W/ TWL XL LVL3 (GOWN DISPOSABLE) ×2 IMPLANT
GOWN STRL REUS W/TWL 2XL LVL3 (GOWN DISPOSABLE) ×4 IMPLANT
INSERT FOGARTY 61MM (MISCELLANEOUS) IMPLANT
INSERT FOGARTY SM (MISCELLANEOUS) IMPLANT
KIT BASIN OR (CUSTOM PROCEDURE TRAY) ×2 IMPLANT
NDL HYPO 22X1.5 SAFETY MO (MISCELLANEOUS) ×2 IMPLANT
NDL SPNL 18GX3.5 QUINCKE PK (NEEDLE) ×2 IMPLANT
NEEDLE HYPO 22X1.5 SAFETY MO (MISCELLANEOUS) ×1 IMPLANT
NEEDLE SPNL 18GX3.5 QUINCKE PK (NEEDLE) ×1 IMPLANT
NS IRRIG 1000ML POUR BTL (IV SOLUTION) ×2 IMPLANT
PACK LAMINECTOMY NEURO (CUSTOM PROCEDURE TRAY) ×2 IMPLANT
PAD ARMBOARD POSITIONER FOAM (MISCELLANEOUS) ×4 IMPLANT
SPACER HEDRON IA 26X34X15 15D (Spacer) IMPLANT
SPONGE INTESTINAL PEANUT (DISPOSABLE) ×4 IMPLANT
SPONGE SURGIFOAM ABS GEL 100 (HEMOSTASIS) ×4 IMPLANT
SPONGE T-LAP 18X18 ~~LOC~~+RFID (SPONGE) ×2 IMPLANT
SPONGE T-LAP 4X18 ~~LOC~~+RFID (SPONGE) ×4 IMPLANT
STAPLER SKIN PROX 35W (STAPLE) ×2 IMPLANT
STRIP CLOSURE SKIN 1/2X4 (GAUZE/BANDAGES/DRESSINGS) IMPLANT
SUT PDS AB 1 CTX 36 (SUTURE) ×4 IMPLANT
SUT PROLENE 4-0 RB1 .5 CRCL 36 (SUTURE) IMPLANT
SUT PROLENE 5 0 CC1 (SUTURE) IMPLANT
SUT PROLENE 6 0 C 1 30 (SUTURE) ×2 IMPLANT
SUT PROLENE 6 0 CC (SUTURE) IMPLANT
SUT SILK 0 TIES 10X30 (SUTURE) ×2 IMPLANT
SUT SILK 2 0 TIES 10X30 (SUTURE) ×4 IMPLANT
SUT SILK 2 0SH CR/8 30 (SUTURE) IMPLANT
SUT SILK 3 0 SH CR/8 (SUTURE) IMPLANT
SUT SILK 3 0SH CR/8 30 (SUTURE) IMPLANT
SUT SILK 3-0 18XBRD TIE BLK (SUTURE) ×2 IMPLANT
SUT VIC AB 0 CT1 18XCR BRD8 (SUTURE) ×2 IMPLANT
SUT VIC AB 0 CT1 27XBRD ANBCTR (SUTURE) ×4 IMPLANT
SUT VIC AB 0 CT1 27XBRD ANTBC (SUTURE) IMPLANT
SUT VIC AB 2-0 CT1 18 (SUTURE) ×2 IMPLANT
SUT VIC AB 3-0 SH 8-18 (SUTURE) ×2 IMPLANT
TOWEL GREEN STERILE (TOWEL DISPOSABLE) ×4 IMPLANT
TOWEL GREEN STERILE FF (TOWEL DISPOSABLE) ×2 IMPLANT
TRAY FOLEY MTR SLVR 14FR STAT (SET/KITS/TRAYS/PACK) IMPLANT
TRAY FOLEY MTR SLVR 16FR STAT (SET/KITS/TRAYS/PACK) ×2 IMPLANT
WATER STERILE IRR 1000ML POUR (IV SOLUTION) ×2 IMPLANT

## 2023-11-12 NOTE — Anesthesia Procedure Notes (Signed)
 Procedure Name: Intubation Date/Time: 11/12/2023 8:04 AM  Performed by: Mollie Olivia SAUNDERS, CRNAPre-anesthesia Checklist: Patient identified, Emergency Drugs available, Suction available and Patient being monitored Patient Re-evaluated:Patient Re-evaluated prior to induction Oxygen Delivery Method: Circle system utilized Preoxygenation: Pre-oxygenation with 100% oxygen Induction Type: IV induction Ventilation: Mask ventilation without difficulty Laryngoscope Size: Glidescope and 3 Tube type: Oral Tube size: 7.0 mm Number of attempts: 1 Airway Equipment and Method: Rigid stylet and Video-laryngoscopy Placement Confirmation: ETT inserted through vocal cords under direct vision, positive ETCO2 and breath sounds checked- equal and bilateral Secured at: 22 cm Tube secured with: Tape Dental Injury: Teeth and Oropharynx as per pre-operative assessment

## 2023-11-12 NOTE — Op Note (Signed)
 Date of procedure: 11/12/2023  Date of dictation: Same  Service: Neurosurgery  Preoperative diagnosis: L5-S1 degenerative spondylolisthesis adjacent segment degeneration status post prior L2-L5 fusion.  Postoperative diagnosis: Same  Procedure Name: L5-S1 anterior lumbar interbody fusion utilizing interbody cage, morselized allograft, and anterior fixation  Surgeon:Terran Hollenkamp A.Raul Winterhalter, M.D.  Asst. Surgeon: Jennetta, NP  Vascular surgeon: Silver, MD  Anesthesia: General  Indication: 78 year old female status post prior L2-L5 decompression and fusion presents with worsening back and bilateral lower extremity pain.  Workup demonstrates evidence of an unstable degenerative spondylolisthesis with marked foraminal stenosis at L5-S1.  Patient has failed conservative management presents now for L5-S1 anterior lumbar interbody fusion utilizing interbody cage, morselized allograft, and anterior plate fixation.  Operative note: After induction anesthesia, patient positioned supine with arms outstretched and appropriately padded.  Patient's lower abdominal region prepped and draped sterilely.  Localizing fluoroscopy was used.  Incision made in the left lower quadrant overlying the L5-S1 disc space.  This carried down sharply through the subcutaneous tissues.  Anterior rectus sheath was divided.  Retroperitoneal approach was performed by Dr. Silver of the vascular surgery service.  Sacral promontory identified.  Iliac vessels mobilized laterally.  A couple of presacral branches were ligated and divided.  Self-retaining tractor was placed in the L5-S1 level was exposed.  Localizing fluoroscopy was used to mark the midline.  I then performed a discectomy by first and sizing the disc space at L5-S1.  Disc material was removed using pituitary rongeurs and Kerrison rongeurs and curettes down to level the posterior annulus.  Posterior annulus was further cleaned using curettes and Kerrison rongeurs.  Foraminotomies  complete on the course the exiting L5 nerve roots bilaterally.  Complete anterior and posterior release of the annulus was performed.  The disc space was sequentially dilated and a 15 mm x 15 degree implant fit snugly with good reduction of the patient's deformity.  A medium 15 mm x 15 degree globus 3D printed cage was packed with Osteocel plus.  This was then impacted into place and recessed slightly from the anterior cortical margins.  Anterior plate fixation with intervertebral stays was then impacted into place in both L5 and S1 with 1 stay going into L5 and 2 stays going into S2.  Locking caps were engaged.  Final images reveal good position cage and the hardware at the proper operative level with normal alignment of the spine.  Retractor system was removed.  Hemostasis was assured.  Wound was then closed in layers.  Steri-Strips and sterile dressing were applied.  No apparent complications.  Patient tolerated the procedure well and she returns to the recovery room postop.

## 2023-11-12 NOTE — Op Note (Signed)
    NAME: ORIAH LEINWEBER    MRN: [id number] DOB: [birth year]    DATE OF OPERATION: 11/12/2023  PREOP DIAGNOSIS:    Lumbosacral degenerative disc disease  POSTOP DIAGNOSIS:    Same  PROCEDURE:    L5-S1 ALIF  SURGEON: Fonda FORBES Rim Co-surgeon: Dr. Jodie Gunnels  ANESTHESIA: General  EBL: 25 mL  INDICATIONS:    KIRSI HUGH is a 78 y.o. female with lumbosacral degenerative disc disease.  She has had multiple operations to the spine, continues to have pain bilaterally.  There is compression of the L5-S1 disc space.  Patient scheduled for L5-S1 ALIF with Dr. Malcolm.  I saw him in the office and discussed the risks benefits of anterior exposure of the L5-S1 to space.  After discussing the above in detail, Elira elected to proceed  FINDINGS:   Adequate exposure of the L5-S1 disc space with ligation of the middle sacral artery and vein.  TECHNIQUE:   Patient was brought to the operating room laid in the supine position.  General anesthesia was induced.  A C-arm was used in the lateral position to mark the L5-S1 disc space over the left rectus muscle.  The abdominal wall was then prepped and draped in standard fashion.   A transverse incision was made two fingerbreadths from the pubis along the left rectus muscle.  This was taken down to the fascia and the anterior fascia of the left rectus muscle opened.  The left rectus was mobilized.  Cautery was used to create flaps under the anterior fascia.  Blunt dissection was used to enter the retroperitoneum lateral to the anterior rectus.  The peritoneum was swept infero-medial.  With my assistant, I was able to use hand-held Wiley retractors to pull the peritoneum and left ureter to midline.   I was able to visualize the L5-S1 disc space.  The middle sacral vessels were divided using clips.  An iliolumbar venous branch was ligated using 2-0 silk suture.  I used a Kd to mobilize soft tissue directly on the anterior longitudinal ligament.  Special  care was taken to ensure the left common iliac vein was mobilized.  The left external iliac system was in our field-of-view and protected.    Next, the fixed NuVasive retractor was placed.  I used 160 mm reverse lip retractor blades x 3, with the caudad retractor 140 mm. A needle was used to confirm we were at the correct disk space.  Both Dr. Gunnels and I agreed that we were.  I then turned the case over to Dr. Gunnels. Please see his note for further details.   Fonda FORBES Rim, MD [facility] DATE OF DICTATION:   11/12/2023

## 2023-11-12 NOTE — Progress Notes (Signed)
 Orthopedic Tech Progress Note Patient Details:  Carol [name] 1945/11/23 [id number]  Patient has back brace   Patient ID: Carol [name], female   DOB: 18-Mar-1946, 78 y.o.   MRN: [id number]  Carol [name] 11/12/2023, 10:53 AM

## 2023-11-12 NOTE — H&P (Signed)
 Carol [name] is an 78 y.o. female.   Chief Complaint: Back pain HPI: 78 year old female with chronic and progressive worsening back and bilateral posterior lower extremity pain failing conservative measures.  Patient remotely status post L2-L5 decompression and fusion with reasonably good results.  Workup demonstrates evidence of progressive degeneration with degenerative spondylolisthesis at L5-S1.  Patient has failed conservative management presents now for L5-S1 anterior lumbar body fusion in hopes of improving her symptoms.  Past Medical History:  Diagnosis Date   Anxiety    Arthritis    COVID-19 11/2020   symptom free since 12/08/20   Depression    Hypertension    reason that she was given Hygro[place]  & she remarks that her BP has been better she has been taking it.    Multilevel degenerative disc disease    Positive skin test for tuberculosis    told that she is a carrier, (treated age 58 and as adult) Negative x-ray(per pt)   Wears contact lenses    Wears dentures    partial upper   Wears hearing aid in right ear     Past Surgical History:  Procedure Laterality Date   ABDOMINAL HYSTERECTOMY     ANKLE ARTHROSCOPY Left 10/30/2019   Procedure: A-SCOPE/DEBRIDEMENT, EXTENSIVE, LEFT ANKLE;  Surgeon: Ashley Soulier, DPM;  Location: [facility];  Service: Podiatry;  Laterality: Left;   ANKLE RECONSTRUCTION Left 10/30/2019   Procedure: BROSTRUM-GOULD LEFT ANKLE;  Surgeon: Ashley Soulier, DPM;  Location: [facility];  Service: Podiatry;  Laterality: Left;   BACK SURGERY     x3 previous cervical fusion & then removed hardware    BREAST SURGERY Right    benign-    CATARACT EXTRACTION W/PHACO Left 01/03/2021   Procedure: CATARACT EXTRACTION PHACO AND INTRAOCULAR LENS PLACEMENT (IOC) LEFT;  Surgeon: Myrna Adine Anes, MD;  Location: [facility];  Service: Ophthalmology;  Laterality: Left;  7.32 0:46.3   CATARACT EXTRACTION W/PHACO Right 01/17/2021   Procedure: CATARACT EXTRACTION  PHACO AND INTRAOCULAR LENS PLACEMENT (IOC) RIGHT;  Surgeon: Myrna Adine Anes, MD;  Location: [facility];  Service: Ophthalmology;  Laterality: Right;  5.09 0:32.9   CHOLECYSTECTOMY  2021   HAND TENDON SURGERY Right 2016   JOINT REPLACEMENT Right    also had a partial on the L knee   LUMBAR FUSION     MYRINGOTOMY WITH TUBE PLACEMENT Right    POSTERIOR CERVICAL FUSION/FORAMINOTOMY N/A 12/26/2021   Procedure: Posterior cervical fusion with lateral mass fixation - C5-C6;  Surgeon: Louis Shove, MD;  Location: [facility];  Service: Neurosurgery;  Laterality: N/A;   TONSILLECTOMY     TUBAL LIGATION     had a 2nd. surgery for adhesions    Family History  Problem Relation Age of Onset   Hypertension Mother    Hyperlipidemia Mother    Heart disease Mother    Aneurysm Mother    Hyperlipidemia Brother    Hypertension Brother    Lung cancer Brother    Social History:  reports that she quit smoking about 47 years ago. Her smoking use included cigarettes. She has never used smokeless tobacco. She reports that she does not currently use drugs. Frequency: 1.00 time per week. She reports that she does not drink alcohol .  Allergies:  Allergies  Allergen Reactions   Garamycin [Gentamicin] Itching, Dermatitis, Rash and Other (See Comments)   Apresoline [Hydralazine] Nausea And Vomiting   Penicillins Hives   Boniva [Ibandronate] Other (See Comments)    Arthralgias  Tooth breaks, falling  out   Celebrex [Celecoxib] Other (See Comments)    Arthralgias    Roxicodone  [Oxycodone ] Itching    OK to take with Benadryl     Medications Prior to Admission  Medication Sig Dispense Refill   cetirizine  (ZYRTEC ) 10 MG tablet Take 10 mg by mouth daily.     chlorthalidone  (HYGROTON ) 50 MG tablet Take 50 mg by mouth daily.     Cholecalciferol  (VITAMIN D3) 50 MCG (2000 UT) capsule Take 2,000 Units by mouth daily.     clonazePAM  (KLONOPIN ) 0.5 MG tablet Take 0.5 mg by mouth daily as needed for anxiety.       diclofenac Sodium (VOLTAREN) 1 % GEL Apply 1 Application topically 2 (two) times daily as needed (pain).     gabapentin  (NEURONTIN ) 100 MG capsule Take 100 mg by mouth daily.     HYDROcodone -acetaminophen  (NORCO) 10-325 MG tablet Take 1 tablet by mouth 4 (four) times daily as needed for severe pain (pain score 7-10).     ondansetron  (ZOFRAN -ODT) 4 MG disintegrating tablet Take 4 mg by mouth every 8 (eight) hours as needed for vomiting or nausea.     polyethylene glycol powder (GLYCOLAX /MIRALAX ) 17 GM/SCOOP powder Take 17 g by mouth daily.     potassium chloride  SA (KLOR-CON  M) 20 MEQ tablet Take 20 mEq by mouth 2 (two) times daily.     pyridOXINE  (VITAMIN B-6) 100 MG tablet Take 100 mg by mouth daily.     rizatriptan  (MAXALT ) 10 MG tablet Take 10 mg by mouth daily as needed for migraine.     rosuvastatin  (CRESTOR ) 5 MG tablet Take 1 tablet by mouth daily.     SUMAtriptan  (IMITREX ) 100 MG tablet Take 1 tablet (100 mg total) by mouth daily as needed for migraine. May repeat in 2 hours if headache persists or recurs. 10 tablet 2   traMADol  (ULTRAM ) 50 MG tablet Take 50 mg by mouth every 6 (six) hours as needed for moderate pain (pain score 4-6).     traZODone  (DESYREL ) 50 MG tablet Take 50 mg by mouth at bedtime as needed for sleep.     venlafaxine  XR (EFFEXOR -XR) 150 MG 24 hr capsule Take 150 mg by mouth daily.     venlafaxine  XR (EFFEXOR -XR) 75 MG 24 hr capsule TAKE 1 CAPSULE(75 MG) BY MOUTH DAILY     albuterol  (VENTOLIN  HFA) 108 (90 Base) MCG/ACT inhaler Inhale 2 puffs into the lungs every 6 (six) hours as needed for wheezing or shortness of breath.     azelastine  (ASTELIN ) 0.1 % nasal spray Place 1 spray into the nose daily as needed for allergies.     zoledronic  acid (RECLAST ) 5 MG/100ML SOLN injection Inject 5 mg into the vein See admin instructions. Infuse 5mg  into the vein once every year      No results found for this or any previous visit (from the past 48 hours). No results  found.  Pertinent items noted in HPI and remainder of comprehensive ROS otherwise negative.  Blood pressure (!) 142/78, pulse 90, temperature 98.8 F (37.1 C), temperature source Oral, resp. rate 18, height 5' 3 (1.6 m), weight 82.6 kg, SpO2 95%.  Patient is awake and alert.  She is oriented and appropriate.  Speech is fluent.  Judgment and insight are intact.  Cranial nerve function normal bladder.  Motor examination reveals intact motor strength bilateral.  Sensory examination with decrease sensation in her L5-S1 dermatomes bilaterally.  Examination head ears eyes nose and throat is unremarkable chest and abdomen are  benign.  Extremities are free of major deformity.  Assessment/Plan L5-S1 disc degeneration with foraminal stenosis and radiculopathy.  Plan L5-S1 anterior lumbar to body fusion.  Risks and benefits been explained.  Patient wishes to proceed.  Carol [name] Carol [name] 11/12/2023, 7:42 AM  \

## 2023-11-12 NOTE — Transfer of Care (Signed)
 Immediate Anesthesia Transfer of Care Note  Patient: Carol [name]  Procedure(s) Performed: ANTERIOR LUMBAR FUSION 1 LEVEL ABDOMINAL EXPOSURE  Patient Location: PACU  Anesthesia Type:General  Level of Consciousness: awake, alert , and drowsy  Airway & Oxygen Therapy: Patient Spontanous Breathing and Patient connected to face mask oxygen  Post-op Assessment: Report given to RN, Post -op Vital signs reviewed and stable, and Patient moving all extremities X 4  Post vital signs: Reviewed and stable  Last Vitals:  Vitals Value Taken Time  BP 141/65 11/12/23 10:15  Temp 36.6 C 11/12/23 10:05  Pulse 90 11/12/23 10:22  Resp 17 11/12/23 10:22  SpO2 97 % 11/12/23 10:22  Vitals shown include unfiled device data.  Last Pain:  Vitals:   11/12/23 1005  TempSrc:   PainSc: Asleep      Patients Stated Pain Goal: 0 (11/12/23 9365)  Complications: No notable events documented.

## 2023-11-12 NOTE — Brief Op Note (Signed)
 11/12/2023  9:37 AM  PATIENT:  Carol [name]  78 y.o. female  PRE-OPERATIVE DIAGNOSIS:  Spondylolisthesis lumbar region  POST-OPERATIVE DIAGNOSIS:  Spondylolisthesis lumbar region  PROCEDURE:  Procedure(s) with comments: ANTERIOR LUMBAR FUSION 1 LEVEL (N/A) - ALIF - L5-S1 with cage ABDOMINAL EXPOSURE (N/A)  SURGEON:  Surgeons and Role: Panel 1:    DEWAINE Louis Shove, MD - Primary Panel 2:    * Lanis Fonda BRAVO, MD - Primary  PHYSICIAN ASSISTANT:   ASSISTANTSBETHA Jennetta PIETY   ANESTHESIA:   general  EBL:  25 mL   BLOOD ADMINISTERED:none  DRAINS: none   LOCAL MEDICATIONS USED:  MARCAINE      SPECIMEN:  No Specimen  DISPOSITION OF SPECIMEN:  N/A  COUNTS:  YES  TOURNIQUET:  * No tourniquets in log *  DICTATION: .Dragon Dictation  PLAN OF CARE: Admit to inpatient   PATIENT DISPOSITION:  PACU - hemodynamically stable.   Delay start of Pharmacological VTE agent (>24hrs) due to surgical blood loss or risk of bleeding: yes

## 2023-11-12 NOTE — Plan of Care (Signed)
 Problem: Education: Goal: Knowledge of General Education information will improve Description: Including pain rating scale, medication(s)/side effects and non-pharmacologic comfort measures Outcome: Completed/Met   Problem: Health Behavior/Discharge Planning: Goal: Ability to manage health-related needs will improve Outcome: Completed/Met   Problem: Clinical Measurements: Goal: Ability to maintain clinical measurements within normal limits will improve Outcome: Completed/Met Goal: Will remain free from infection Outcome: Completed/Met Goal: Diagnostic test results will improve Outcome: Completed/Met Goal: Respiratory complications will improve Outcome: Completed/Met Goal: Cardiovascular complication will be avoided Outcome: Completed/Met   Problem: Activity: Goal: Risk for activity intolerance will decrease Outcome: Completed/Met   Problem: Nutrition: Goal: Adequate nutrition will be maintained Outcome: Completed/Met   Problem: Coping: Goal: Level of anxiety will decrease Outcome: Completed/Met   Problem: Elimination: Goal: Will not experience complications related to bowel motility Outcome: Completed/Met Goal: Will not experience complications related to urinary retention Outcome: Completed/Met   Problem: Pain Managment: Goal: General experience of comfort will improve and/or be controlled Outcome: Completed/Met   Problem: Safety: Goal: Ability to remain free from injury will improve Outcome: Completed/Met   Problem: Skin Integrity: Goal: Risk for impaired skin integrity will decrease Outcome: Completed/Met   Problem: Education: Goal: Ability to verbalize activity precautions or restrictions will improve Outcome: Completed/Met Goal: Knowledge of the prescribed therapeutic regimen will improve Outcome: Completed/Met Goal: Understanding of discharge needs will improve Outcome: Completed/Met   Problem: Activity: Goal: Ability to avoid complications of  mobility impairment will improve Outcome: Completed/Met Goal: Ability to tolerate increased activity will improve Outcome: Completed/Met Goal: Will remain free from falls Outcome: Completed/Met   Problem: Bowel/Gastric: Goal: Gastrointestinal status for postoperative course will improve Outcome: Completed/Met   Problem: Clinical Measurements: Goal: Ability to maintain clinical measurements within normal limits will improve Outcome: Completed/Met Goal: Postoperative complications will be avoided or minimized Outcome: Completed/Met Goal: Diagnostic test results will improve Outcome: Completed/Met   Problem: Pain Management: Goal: Pain level will decrease Outcome: Completed/Met   Problem: Skin Integrity: Goal: Will show signs of wound healing Outcome: Completed/Met   Problem: Health Behavior/Discharge Planning: Goal: Identification of resources available to assist in meeting health care needs will improve Outcome: Completed/Met   Problem: Bladder/Genitourinary: Goal: Urinary functional status for postoperative course will improve Outcome: Completed/Met   Problem: Education: Goal: Knowledge of General Education information will improve Description: Including pain rating scale, medication(s)/side effects and non-pharmacologic comfort measures Outcome: Completed/Met   Problem: Health Behavior/Discharge Planning: Goal: Ability to manage health-related needs will improve Outcome: Completed/Met   Problem: Clinical Measurements: Goal: Ability to maintain clinical measurements within normal limits will improve Outcome: Completed/Met Goal: Will remain free from infection Outcome: Completed/Met Goal: Diagnostic test results will improve Outcome: Completed/Met Goal: Respiratory complications will improve Outcome: Completed/Met Goal: Cardiovascular complication will be avoided Outcome: Completed/Met   Problem: Activity: Goal: Risk for activity intolerance will  decrease Outcome: Completed/Met   Problem: Nutrition: Goal: Adequate nutrition will be maintained Outcome: Completed/Met   Problem: Coping: Goal: Level of anxiety will decrease Outcome: Completed/Met   Problem: Elimination: Goal: Will not experience complications related to bowel motility Outcome: Completed/Met Goal: Will not experience complications related to urinary retention Outcome: Completed/Met   Problem: Pain Managment: Goal: General experience of comfort will improve and/or be controlled Outcome: Completed/Met   Problem: Safety: Goal: Ability to remain free from injury will improve Outcome: Completed/Met   Problem: Skin Integrity: Goal: Risk for impaired skin integrity will decrease Outcome: Completed/Met   Problem: Education: Goal: Ability to verbalize activity precautions or restrictions will improve Outcome: Completed/Met Goal:  Knowledge of the prescribed therapeutic regimen will improve Outcome: Completed/Met Goal: Understanding of discharge needs will improve Outcome: Completed/Met   Problem: Activity: Goal: Ability to avoid complications of mobility impairment will improve Outcome: Completed/Met Goal: Ability to tolerate increased activity will improve Outcome: Completed/Met Goal: Will remain free from falls Outcome: Completed/Met   Problem: Bowel/Gastric: Goal: Gastrointestinal status for postoperative course will improve Outcome: Completed/Met   Problem: Clinical Measurements: Goal: Ability to maintain clinical measurements within normal limits will improve Outcome: Completed/Met Goal: Postoperative complications will be avoided or minimized Outcome: Completed/Met Goal: Diagnostic test results will improve Outcome: Completed/Met   Problem: Pain Management: Goal: Pain level will decrease Outcome: Completed/Met   Problem: Skin Integrity: Goal: Will show signs of wound healing Outcome: Completed/Met   Problem: Health Behavior/Discharge  Planning: Goal: Identification of resources available to assist in meeting health care needs will improve Outcome: Completed/Met   Problem: Bladder/Genitourinary: Goal: Urinary functional status for postoperative course will improve Outcome: Completed/Met

## 2023-11-12 NOTE — Progress Notes (Signed)
 Office Note   Patient seen and examined in preop holding.  No complaints. No changes to medication history or physical exam since last seen in clinic. After discussing the risks and benefits of Anterior exposure of the L5-S1 disk space for ALIF, Carol [name] elected to proceed.   Carol FORBES Rim MD   CC: L5-S1 ALIF Requesting Provider: Dr. Victory Boers.  HPI: Carol [name] is a 78 y.o. ([birth year]) female presenting at the request of Dr. Victory Boers for L5-S1 ALIF evaluation.  She presents today with longstanding history of bilateral lower extremity sciatica and compression of the L5-S1 disc space.  On exam, Carol [name] was doing well.  A native of [place], she lived in [place] prior to moving to [place] recently.  She has been very happy with this move.  Carol [name] has had multiple musculoskeletal procedures with Dr. Boers due to degenerative disc disease. She denies history of abdominal surgery.  Notes prior sclerotherapy for telangiectasias on the left leg.  Her main complaint was left lower extremity edema which she states waxes and wanes.  She wonders if there is a venous component to this.    Past Medical History:  Diagnosis Date   Anxiety    Arthritis    COVID-19 11/2020   symptom free since 12/08/20   Depression    Hypertension    reason that she was given Hygro[place]  & she remarks that her BP has been better she has been taking it.    Multilevel degenerative disc disease    Positive skin test for tuberculosis    told that she is a carrier, (treated age 64 and as adult) Negative x-ray(per pt)   Wears contact lenses    Wears dentures    partial upper   Wears hearing aid in right ear     Past Surgical History:  Procedure Laterality Date   ABDOMINAL HYSTERECTOMY     ANKLE ARTHROSCOPY Left 10/30/2019   Procedure: A-SCOPE/DEBRIDEMENT, EXTENSIVE, LEFT ANKLE;  Surgeon: Ashley Soulier, DPM;  Location: [facility];  Service: Podiatry;  Laterality: Left;   ANKLE RECONSTRUCTION Left  10/30/2019   Procedure: BROSTRUM-GOULD LEFT ANKLE;  Surgeon: Ashley Soulier, DPM;  Location: [facility];  Service: Podiatry;  Laterality: Left;   BACK SURGERY     x3 previous cervical fusion & then removed hardware    BREAST SURGERY Right    benign-    CATARACT EXTRACTION W/PHACO Left 01/03/2021   Procedure: CATARACT EXTRACTION PHACO AND INTRAOCULAR LENS PLACEMENT (IOC) LEFT;  Surgeon: Myrna Adine Anes, MD;  Location: [facility];  Service: Ophthalmology;  Laterality: Left;  7.32 0:46.3   CATARACT EXTRACTION W/PHACO Right 01/17/2021   Procedure: CATARACT EXTRACTION PHACO AND INTRAOCULAR LENS PLACEMENT (IOC) RIGHT;  Surgeon: Myrna Adine Anes, MD;  Location: [facility];  Service: Ophthalmology;  Laterality: Right;  5.09 0:32.9   CHOLECYSTECTOMY  2021   HAND TENDON SURGERY Right 2016   JOINT REPLACEMENT Right    also had a partial on the L knee   LUMBAR FUSION     MYRINGOTOMY WITH TUBE PLACEMENT Right    POSTERIOR CERVICAL FUSION/FORAMINOTOMY N/A 12/26/2021   Procedure: Posterior cervical fusion with lateral mass fixation - C5-C6;  Surgeon: Louis Victory, MD;  Location: [facility];  Service: Neurosurgery;  Laterality: N/A;   TONSILLECTOMY     TUBAL LIGATION     had a 2nd. surgery for adhesions    Social History   Socioeconomic History   Marital status: Divorced    Spouse  name: Not on file   Number of children: Not on file   Years of education: Not on file   Highest education level: Not on file  Occupational History   Not on file  Tobacco Use   Smoking status: Former    Current packs/day: 0.00    Types: Cigarettes    Quit date: 03/15/1976    Years since quitting: 47.6   Smokeless tobacco: Never  Vaping Use   Vaping status: Never Used  Substance and Sexual Activity   Alcohol  use: No   Drug use: Not Currently    Frequency: 1.0 times per week    Comment: occasionally    Sexual activity: Not on file  Other Topics Concern   Not on file  Social History Narrative    Not on file   Social Drivers of Health   Financial Resource Strain: Low Risk  (11/29/2022)   Received from [facility]   Overall Financial Resource Strain (CARDIA)    Difficulty of Paying Living Expenses: Not hard at all  Food Insecurity: No Food Insecurity (11/29/2022)   Received from [facility]   Hunger Vital Sign    Within the past 12 months, you worried that your food would run out before you got the money to buy more.: Never true    Within the past 12 months, the food you bought just didn't last and you didn't have money to get more.: Never true  Transportation Needs: No Transportation Needs (11/29/2022)   Received from [facility] - Transportation    In the past 12 months, has lack of transportation kept you from medical appointments or from getting medications?: No    Lack of Transportation (Non-Medical): No  Physical Activity: Not on file  Stress: Not on file  Social Connections: Not on file  Intimate Partner Violence: Not on file   Family History  Problem Relation Age of Onset   Hypertension Mother    Hyperlipidemia Mother    Heart disease Mother    Aneurysm Mother    Hyperlipidemia Brother    Hypertension Brother    Lung cancer Brother     Current Facility-Administered Medications  Medication Dose Route Frequency Provider Last Rate Last Admin   ceFAZolin  (ANCEF ) IVPB 2g/100 mL premix  2 g Intravenous On Call to OR Louis Shove, MD       Chlorhexidine  Gluconate Cloth 2 % PADS 6 each  6 each Topical Once Louis Shove, MD       And   Chlorhexidine  Gluconate Cloth 2 % PADS 6 each  6 each Topical Once Louis Shove, MD       Chlorhexidine  Gluconate Cloth 2 % PADS 6 each  6 each Topical Once Neveah Bang E, MD       And   Chlorhexidine  Gluconate Cloth 2 % PADS 6 each  6 each Topical Once Dartanion Teo E, MD       lactated ringers  infusion   Intravenous Continuous Epifanio Charleston, MD       remifentanil   (ULTIVA ) 2 mg in 100 mL normal saline (20 mcg/mL) Optime  0.15 mcg/kg/min Intravenous To OR Mollie Olivia SAUNDERS, CRNA        Allergies  Allergen Reactions   Garamycin [Gentamicin] Itching, Dermatitis, Rash and Other (See Comments)   Apresoline [Hydralazine] Nausea And Vomiting   Penicillins Hives   Boniva [Ibandronate] Other (See Comments)    Arthralgias  Tooth breaks, falling out   Celebrex [  Celecoxib] Other (See Comments)    Arthralgias    Roxicodone  [Oxycodone ] Itching    OK to take with Benadryl      REVIEW OF SYSTEMS:  [X]  denotes positive finding, [ ]  denotes negative finding Cardiac  Comments:  Chest pain or chest pressure:    Shortness of breath upon exertion:    Short of breath when lying flat:    Irregular heart rhythm:        Vascular    Pain in calf, thigh, or hip brought on by ambulation:    Pain in feet at night that wakes you up from your sleep:     Blood clot in your veins:    Leg swelling:         Pulmonary    Oxygen at home:    Productive cough:     Wheezing:         Neurologic    Sudden weakness in arms or legs:     Sudden numbness in arms or legs:     Sudden onset of difficulty speaking or slurred speech:    Temporary loss of vision in one eye:     Problems with dizziness:         Gastrointestinal    Blood in stool:     Vomited blood:         Genitourinary    Burning when urinating:     Blood in urine:        Psychiatric    Major depression:         Hematologic    Bleeding problems:    Problems with blood clotting too easily:        Skin    Rashes or ulcers:        Constitutional    Fever or chills:      PHYSICAL EXAMINATION:  Vitals:   11/12/23 0602  BP: (!) 142/78  Pulse: 90  Resp: 18  Temp: 98.8 F (37.1 C)  TempSrc: Oral  SpO2: 95%  Weight: 82.6 kg  Height: 5' 3 (1.6 m)    General:  WDWN in NAD; vital signs documented above Gait: Not observed HENT: WNL, normocephalic Pulmonary: normal non-labored breathing ,  without wheezing Cardiac: regular HR Abdomen: soft, NT, no masses Skin: without rashes Vascular Exam/Pulses:  Right Left  Radial 2+ (normal) 2+ (normal)  Ulnar    Femoral    Popliteal    DP 2+ (normal) 2+ (normal)  PT     Extremities: without ischemic changes, without Gangrene , without cellulitis; without open wounds;  Musculoskeletal: no muscle wasting or atrophy  Neurologic: A&O X 3;  No focal weakness or paresthesias are detected Psychiatric:  The pt has Normal affect.   Non-Invasive Vascular Imaging:   See MRI    ASSESSMENT/PLAN: Carol [name] is a 78 y.o. female presenting with bilateral lower extremity sciatica with plans for L5-S1 ALIF.  I independently reviewed her MRI, and think that she is a good candidate.  It appears the confluence of the IVC and to the bifurcation of the aorta said much higher. Carol and I had a long discussion regarding my role in the L5-S1 ALIF, specifically safe exposure of the disc space. We discussed complications such as hemorrhage, death, damage to adjacent structures, hernia amongst others.  After discussing the risks and benefits, she elected to proceed.  We also discussed the left lower extremity edema.  My plan is to have her back in the office in 6 months once  she has recovered from her surgery for venous insufficiency workup.    Carol FORBES Rim, MD [facility] [phone]

## 2023-11-13 ENCOUNTER — Encounter (HOSPITAL_COMMUNITY): Payer: Self-pay | Admitting: Neurosurgery

## 2023-11-13 MED ORDER — HYDROCODONE-ACETAMINOPHEN 10-325 MG PO TABS: 1.0000 | ORAL_TABLET | Freq: Four times a day (QID) | ORAL | 0 refills | Status: DC | PRN

## 2023-11-13 MED ORDER — METHOCARBAMOL 500 MG PO TABS: 500.0000 mg | ORAL_TABLET | Freq: Four times a day (QID) | ORAL | 1 refills | Status: DC | PRN

## 2023-11-13 NOTE — Discharge Summary (Signed)
 Physician Discharge Summary  Patient ID: Carol [name] MRN: [id number] DOB/AGE: 1945/04/19 78 y.o.  Admit date: 11/12/2023 Discharge date: 11/13/2023  Admission Diagnoses:  Discharge Diagnoses:  Principal Problem:   Lumbar adjacent segment disease with spondylolisthesis   Discharged Condition: good  Hospital Course: Patient admitted to the hospital where she underwent uncomplicated L5-S1 anterior lumbar MRI fusion.  Postoperatively doing very well.  Preoperative back and lower extremity pain much improved.  Standing ambulating and voiding without difficulty.  Ready for discharge home.  Consults:   Significant Diagnostic Studies:   Treatments:   Discharge Exam: Blood pressure 131/68, pulse 99, temperature 98.4 F (36.9 C), temperature source Oral, resp. rate 16, height 5' 3 (1.6 m), weight 82.6 kg, SpO2 95%. Awake and alert.  Oriented and appropriate.  Motor and sensory function intact.  Wound clean and dry.  Chest and abdomen benign.  Disposition: Discharge disposition: 01-Home or Self Care        Allergies as of 11/13/2023       Reactions   Garamycin [gentamicin] Itching, Dermatitis, Rash, Other (See Comments)   Apresoline [hydralazine] Nausea And Vomiting   Penicillins Hives   Boniva [ibandronate] Other (See Comments)   Arthralgias  Tooth breaks, falling out   Celebrex [celecoxib] Other (See Comments)   Arthralgias    Roxicodone  [oxycodone ] Itching   OK to take with Benadryl         Medication List     TAKE these medications    albuterol  108 (90 Base) MCG/ACT inhaler Commonly known as: VENTOLIN  HFA Inhale 2 puffs into the lungs every 6 (six) hours as needed for wheezing or shortness of breath.   azelastine  0.1 % nasal spray Commonly known as: ASTELIN  Place 1 spray into the nose daily as needed for allergies.   cetirizine  10 MG tablet Commonly known as: ZYRTEC  Take 10 mg by mouth daily.   chlorthalidone  50 MG tablet Commonly known as:  HYGROTON  Take 50 mg by mouth daily.   clonazePAM  0.5 MG tablet Commonly known as: KLONOPIN  Take 0.5 mg by mouth daily as needed for anxiety.   diclofenac Sodium 1 % Gel Commonly known as: VOLTAREN Apply 1 Application topically 2 (two) times daily as needed (pain).   gabapentin  100 MG capsule Commonly known as: NEURONTIN  Take 100 mg by mouth daily.   HYDROcodone -acetaminophen  10-325 MG tablet Commonly known as: NORCO Take 1 tablet by mouth 4 (four) times daily as needed for severe pain (pain score 7-10).   methocarbamol  500 MG tablet Commonly known as: ROBAXIN  Take 1 tablet (500 mg total) by mouth every 6 (six) hours as needed for muscle spasms.   ondansetron  4 MG disintegrating tablet Commonly known as: ZOFRAN -ODT Take 4 mg by mouth every 8 (eight) hours as needed for vomiting or nausea.   polyethylene glycol powder 17 GM/SCOOP powder Commonly known as: GLYCOLAX /MIRALAX  Take 17 g by mouth daily.   potassium chloride  SA 20 MEQ tablet Commonly known as: KLOR-CON  M Take 20 mEq by mouth 2 (two) times daily.   pyridOXINE  100 MG tablet Commonly known as: VITAMIN B6 Take 100 mg by mouth daily.   rizatriptan  10 MG tablet Commonly known as: MAXALT  Take 10 mg by mouth daily as needed for migraine.   rosuvastatin  5 MG tablet Commonly known as: CRESTOR  Take 1 tablet by mouth daily.   SUMAtriptan  100 MG tablet Commonly known as: IMITREX  Take 1 tablet (100 mg total) by mouth daily as needed for migraine. May repeat in 2 hours if headache persists  or recurs.   traMADol  50 MG tablet Commonly known as: ULTRAM  Take 50 mg by mouth every 6 (six) hours as needed for moderate pain (pain score 4-6).   traZODone  50 MG tablet Commonly known as: DESYREL  Take 50 mg by mouth at bedtime as needed for sleep.   venlafaxine  XR 150 MG 24 hr capsule Commonly known as: EFFEXOR -XR Take 150 mg by mouth daily.   venlafaxine  XR 75 MG 24 hr capsule Commonly known as: EFFEXOR -XR TAKE 1  CAPSULE(75 MG) BY MOUTH DAILY   Vitamin D3 50 MCG (2000 UT) capsule Take 2,000 Units by mouth daily.   zoledronic  acid 5 MG/100ML Soln injection Commonly known as: RECLAST  Inject 5 mg into the vein See admin instructions. Infuse 5mg  into the vein once every year               Durable Medical Equipment  (From admission, onward)           Start     Ordered   11/12/23 1052  DME Walker rolling  Once       Question:  Patient needs a walker to treat with the following condition  Answer:  Lumbar adjacent segment disease with spondylolisthesis   11/12/23 1051   11/12/23 1052  DME 3 n 1  Once        11/12/23 1051            Follow-up Information     Louis Shove, MD. Call.   Specialty: Neurosurgery Why: As needed, If symptoms worsen Contact information: [address] [place] [address] [phone]                 Signed: Shove DELENA Louis 11/13/2023, 11:14 AM

## 2023-11-13 NOTE — Evaluation (Signed)
 Occupational Therapy Evaluation Patient Details Name: Carol [name] MRN: [id number] DOB: 13-Mar-1946 Today's Date: 11/13/2023   History of Present Illness   Pt is a 78 y.o. female s/p L5-S1 anterior lumbar interbody fusion; ligation of the middle sacral artery and vein. PMH: cervical surgery x4, anxiety, arthritis, COVID, HTN, DDD, HOH.     Clinical Impressions PTA, pt lived alone and was independent in ADL and IADL with use of cane and other compensatory techniques as needed. Upon eval, pt performing BADL with mod I. Pt educated and demonstrating use of compensatory techniques for bed mobility, LB ADL, grooming, toileting, brace application, and shower transfers within precautions. All education provided and questions answered. Recommending discharge home with no OT follow up at this time. OT to sign off. Please re-consult if change in status.      If plan is discharge home, recommend the following:   Other (comment) (on pt request)     Functional Status Assessment   Patient has had a recent decline in their functional status and demonstrates the ability to make significant improvements in function in a reasonable and predictable amount of time.     Equipment Recommendations   None recommended by OT     Recommendations for Other Services         Precautions/Restrictions   Precautions Precautions: Back Precaution Booklet Issued: Yes (comment) Recall of Precautions/Restrictions: Intact Precaution/Restrictions Comments: Educated on 3/3 back precautions. Handout provided. Required Braces or Orthoses: Spinal Brace Spinal Brace: Lumbar corset;Applied in sitting position     Mobility Bed Mobility Overal bed mobility: Modified Independent                  Transfers Overall transfer level: Modified independent Equipment used: Rolling walker (2 wheels)                      Balance Overall balance assessment: Mild deficits observed, not formally  tested                                         ADL either performed or assessed with clinical judgement   ADL Overall ADL's : Modified independent                                       General ADL Comments: within precautions after initial education     Vision         Perception         Praxis         Pertinent Vitals/Pain Pain Assessment Pain Assessment: 0-10 Pain Score: 1  Pain Location: incisional Pain Descriptors / Indicators: Discomfort Pain Intervention(s): Limited activity within patient's tolerance     Extremity/Trunk Assessment Upper Extremity Assessment Upper Extremity Assessment: Overall WFL for tasks assessed   Lower Extremity Assessment Lower Extremity Assessment: Defer to PT evaluation   Cervical / Trunk Assessment Cervical / Trunk Assessment: Back Surgery   Communication Communication Communication: Impaired Factors Affecting Communication: Hearing impaired   Cognition Arousal: Alert Behavior During Therapy: WFL for tasks assessed/performed Cognition: No apparent impairments                               Following commands: Intact       Cueing  General Comments   Cueing Techniques: Verbal cues  VSS   Exercises     Shoulder Instructions      Home Living Family/patient expects to be discharged to:: Private residence Living Arrangements: Alone Available Help at Discharge: Family;Available PRN/intermittently Type of Home: Other(Comment) (condo) Home Access: Level entry     Home Layout: One level     Bathroom Shower/Tub: Walk-in shower;Door   [employer] Toilet: Handicapped height Bathroom Accessibility: Yes How Accessible: Accessible via walker Home Equipment: Cane - single point;Shower seat - built in;Grab bars - toilet;Grab bars - tub/shower;Lift chair;Other (comment);Hand held shower head (adjustable frame bed)          Prior Functioning/Environment Prior Level of Function  : Independent/Modified Independent;Driving             Mobility Comments: amb with cane at baseline ADLs Comments: indep    OT Problem List: Decreased strength;Decreased activity tolerance;Impaired balance (sitting and/or standing);Pain   OT Treatment/Interventions:        OT Goals(Current goals can be found in the care plan section)   Acute Rehab OT Goals Patient Stated Goal: get better OT Goal Formulation: With patient   OT Frequency:       Co-evaluation              AM-PAC OT 6 Clicks Daily Activity     Outcome Measure Help from another person eating meals?: None Help from another person taking care of personal grooming?: None Help from another person toileting, which includes using toliet, bedpan, or urinal?: None Help from another person bathing (including washing, rinsing, drying)?: None Help from another person to put on and taking off regular upper body clothing?: None Help from another person to put on and taking off regular lower body clothing?: None 6 Click Score: 24   End of Session Equipment Utilized During Treatment: Gait belt;Rolling walker (2 wheels);Back brace Nurse Communication: Mobility status  Activity Tolerance: Patient tolerated treatment well Patient left: with call bell/phone within reach;in bed  OT Visit Diagnosis: Unsteadiness on feet (R26.81);Muscle weakness (generalized) (M62.81);Pain                Time: 9243-9188 OT Time Calculation (min): 15 min Charges:  OT General Charges $OT Visit: 1 Visit OT Evaluation $OT Eval Low Complexity: 1 Low  Carol [name] FREDERICK, OTR/L [facility] Acute Rehabilitation Office: [phone]   Carol [name] 11/13/2023, 8:58 AM

## 2023-11-13 NOTE — Progress Notes (Signed)
 Patient awaiting family for discharge home, Patient in no acute distress nor complaints of pain nor discomfort; incision on back is clean, dry and intact; No c/o pain at this time. Room was checked and accounted for all patient's belongings; discharge instructions concerning her medications, incision care, follow up appointment and when to call the doctor as needed were all discussed with patient by RN and she expressed understanding on the instructions given.

## 2023-11-13 NOTE — Discharge Instructions (Signed)
 Wound Care Keep incision covered and dry until post op day 3. You may remove the Honeycomb dressing on post op day 3. Leave steri-strips on back.  They will fall off by themselves. Do not put any creams, lotions, or ointments on incision. You are fine to shower. Let water run over incision and pat dry.   Activity Walk each and every day, increasing distance each day. No lifting greater than 8 lbs.  No lifting no bending no twisting no driving . You can ride as a Dealer. If provided with back brace, wear when out of bed.  It is not necessary to wear brace in bed.  Diet Resume your normal diet.  Call Your Doctor If Any of These Occur Redness, drainage, or swelling at the wound.  Temperature greater than 101 degrees. Severe pain not relieved by pain medication. Incision starts to come apart.  Follow Up Appt Call [phone] if you have one or any problem.

## 2023-11-13 NOTE — Anesthesia Postprocedure Evaluation (Signed)
 Anesthesia Post Note  Patient: Carol [name]  Procedure(s) Performed: ANTERIOR LUMBAR FUSION 1 LEVEL ABDOMINAL EXPOSURE     Patient location during evaluation: PACU Anesthesia Type: General Level of consciousness: awake and alert Pain management: pain level controlled Vital Signs Assessment: post-procedure vital signs reviewed and stable Respiratory status: spontaneous breathing, nonlabored ventilation, respiratory function stable and patient connected to nasal cannula oxygen Cardiovascular status: blood pressure returned to baseline and stable Postop Assessment: no apparent nausea or vomiting Anesthetic complications: no   No notable events documented.  Last Vitals:  Vitals:   11/13/23 0311 11/13/23 0736  BP: 132/79 131/68  Pulse: (!) 104 99  Resp: 18 16  Temp: 37 C 36.9 C  SpO2: 95% 95%    Last Pain:  Vitals:   11/13/23 1222  TempSrc:   PainSc: 3                  [name]

## 2023-11-13 NOTE — Evaluation (Signed)
 Physical Therapy Evaluation Patient Details Name: Carol [name] MRN: [id number] DOB: 1945-12-17 Today's Date: 11/13/2023  History of Present Illness  Pt is a 78 y.o. female s/p L5-S1 anterior lumbar interbody fusion; ligation of the middle sacral artery and vein. PMH: cervical surgery x4, anxiety, arthritis, COVID, HTN, DDD, HOH.  Clinical Impression  PT eval complete. PTA pt lived alone, mod I mobility with cane. On eval, pt demo mod I bed mobility and mod I transfers. Supervsion amb 400' with RW. Amb in room without AD supervision. Steady gait. All education complete. Plan is for d/c home today. No further skilled PT needs. Rec RW for home. PT signing off.         If plan is discharge home, recommend the following: Assist for transportation;Assistance with cooking/housework   Can travel by private Tax inspector (2 wheels)  Recommendations for Other Services       Functional Status Assessment Patient has had a recent decline in their functional status and demonstrates the ability to make significant improvements in function in a reasonable and predictable amount of time.     Precautions / Restrictions Precautions Precautions: Back Recall of Precautions/Restrictions: Intact Precaution/Restrictions Comments: Educated on 3/3 back precautions. Handout provided. Required Braces or Orthoses: Spinal Brace Spinal Brace: Lumbar corset;Applied in sitting position      Mobility  Bed Mobility Overal bed mobility: Modified Independent                  Transfers Overall transfer level: Modified independent Equipment used: Ambulation equipment used                    Ambulation/Gait Ambulation/Gait assistance: Supervision Gait Distance (Feet): 400 Feet Assistive device: Rolling walker (2 wheels) Gait Pattern/deviations: Step-through pattern Gait velocity: WFL Gait velocity interpretation: >2.62 ft/sec, indicative of  community ambulatory   General Gait Details: Amb without AD in room. Using RW for longer distances. Steady gait.  Stairs            Wheelchair Mobility     Tilt Bed    Modified Rankin (Stroke Patients Only)       Balance Overall balance assessment: Mild deficits observed, not formally tested                                           Pertinent Vitals/Pain Pain Assessment Pain Assessment: 0-10 Pain Score: 1  Pain Location: back Pain Descriptors / Indicators: Discomfort Pain Intervention(s): Monitored during session    Home Living Family/patient expects to be discharged to:: Private residence Living Arrangements: Alone Available Help at Discharge: Family;Available PRN/intermittently Type of Home: Other(Comment) (condo) Home Access: Level entry       Home Layout: One level Home Equipment: Cane - single point;Shower seat - built in;Grab bars - toilet;Grab bars - tub/shower;Lift chair;Other (comment);Hand held shower head (adjustable frame bed)      Prior Function Prior Level of Function : Independent/Modified Independent;Driving             Mobility Comments: amb with cane at baseline ADLs Comments: indep     Extremity/Trunk Assessment   Upper Extremity Assessment Upper Extremity Assessment: Defer to OT evaluation    Lower Extremity Assessment Lower Extremity Assessment: Overall WFL for tasks assessed    Cervical / Trunk Assessment Cervical / Trunk Assessment: Back Surgery  Communication   Communication Communication: Impaired Factors Affecting Communication: Hearing impaired    Cognition Arousal: Alert Behavior During Therapy: WFL for tasks assessed/performed   PT - Cognitive impairments: No apparent impairments                         Following commands: Intact       Cueing Cueing Techniques: Verbal cues     General Comments      Exercises     Assessment/Plan    PT Assessment Patient does not need  any further PT services  PT Problem List         PT Treatment Interventions      PT Goals (Current goals can be found in the Care Plan section)  Acute Rehab PT Goals Patient Stated Goal: home today PT Goal Formulation: All assessment and education complete, DC therapy    Frequency       Co-evaluation               AM-PAC PT 6 Clicks Mobility  Outcome Measure Help needed turning from your back to your side while in a flat bed without using bedrails?: None Help needed moving from lying on your back to sitting on the side of a flat bed without using bedrails?: None Help needed moving to and from a bed to a chair (including a wheelchair)?: None Help needed standing up from a chair using your arms (e.g., wheelchair or bedside chair)?: None Help needed to walk in hospital room?: A Little Help needed climbing 3-5 steps with a railing? : A Little 6 Click Score: 22    End of Session Equipment Utilized During Treatment: Back brace Activity Tolerance: Patient tolerated treatment well Patient left: in bed;with call bell/phone within reach Nurse Communication: Mobility status PT Visit Diagnosis: Difficulty in walking, not elsewhere classified (R26.2)    Time: 9253-9242 PT Time Calculation (min) (ACUTE ONLY): 11 min   Charges:   PT Evaluation $PT Eval Low Complexity: 1 Low   PT General Charges $$ ACUTE PT VISIT: 1 Visit         Sari MATSU., PT  Office # [phone]   Erven Sari Shaker 11/13/2023, 8:11 AM

## 2023-11-13 NOTE — Progress Notes (Signed)
 Postop day 1 ALIF  Doing well this morning. States she has small amount of pain at her incision, otherwise asymptomatic.  No pain in her legs.  No numbness in the feet.  Wearing brace, standing up working with physical therapy  Patient doing well from a vascular surgery perspective.  Will sign off at this time.

## 2023-11-17 ENCOUNTER — Encounter (HOSPITAL_COMMUNITY): Payer: Self-pay

## 2023-11-18 ENCOUNTER — Inpatient Hospital Stay (HOSPITAL_COMMUNITY)
Admission: EM | Admit: 2023-11-18 | Discharge: 2023-11-30 | DRG: 451 | Disposition: A | Discharge status: Home / Self Care | Attending: Neurosurgery | Admitting: Neurosurgery

## 2023-11-18 ENCOUNTER — Other Ambulatory Visit: Payer: Self-pay

## 2023-11-18 ENCOUNTER — Encounter (HOSPITAL_COMMUNITY): Payer: Self-pay | Admitting: Neurosurgery

## 2023-11-18 DIAGNOSIS — M960 Pseudarthrosis after fusion or arthrodesis: Secondary | ICD-10-CM | POA: Diagnosis not present

## 2023-11-18 DIAGNOSIS — Z888 Allergy status to other drugs, medicaments and biological substances status: Secondary | ICD-10-CM

## 2023-11-18 DIAGNOSIS — Z886 Allergy status to analgesic agent status: Secondary | ICD-10-CM

## 2023-11-18 DIAGNOSIS — T8142XA Infection following a procedure, deep incisional surgical site, initial encounter: Secondary | ICD-10-CM | POA: Diagnosis present

## 2023-11-18 DIAGNOSIS — M8448XA Pathological fracture, other site, initial encounter for fracture: Secondary | ICD-10-CM | POA: Diagnosis present

## 2023-11-18 DIAGNOSIS — F419 Anxiety disorder, unspecified: Secondary | ICD-10-CM | POA: Diagnosis present

## 2023-11-18 DIAGNOSIS — E669 Obesity, unspecified: Secondary | ICD-10-CM | POA: Diagnosis present

## 2023-11-18 DIAGNOSIS — Y831 Surgical operation with implant of artificial internal device as the cause of abnormal reaction of the patient, or of later complication, without mention of misadventure at the time of the procedure: Secondary | ICD-10-CM | POA: Diagnosis present

## 2023-11-18 DIAGNOSIS — F32A Depression, unspecified: Secondary | ICD-10-CM | POA: Diagnosis present

## 2023-11-18 DIAGNOSIS — Z801 Family history of malignant neoplasm of trachea, bronchus and lung: Secondary | ICD-10-CM

## 2023-11-18 DIAGNOSIS — M4317 Spondylolisthesis, lumbosacral region: Secondary | ICD-10-CM | POA: Diagnosis present

## 2023-11-18 DIAGNOSIS — Z881 Allergy status to other antibiotic agents status: Secondary | ICD-10-CM | POA: Diagnosis not present

## 2023-11-18 DIAGNOSIS — B372 Candidiasis of skin and nail: Secondary | ICD-10-CM | POA: Diagnosis present

## 2023-11-18 DIAGNOSIS — Z832 Family history of diseases of the blood and blood-forming organs and certain disorders involving the immune mechanism: Secondary | ICD-10-CM | POA: Diagnosis not present

## 2023-11-18 DIAGNOSIS — T84028A Dislocation of other internal joint prosthesis, initial encounter: Principal | ICD-10-CM | POA: Diagnosis present

## 2023-11-18 DIAGNOSIS — Z8249 Family history of ischemic heart disease and other diseases of the circulatory system: Secondary | ICD-10-CM | POA: Diagnosis not present

## 2023-11-18 DIAGNOSIS — Z885 Allergy status to narcotic agent status: Secondary | ICD-10-CM | POA: Diagnosis not present

## 2023-11-18 DIAGNOSIS — Z79899 Other long term (current) drug therapy: Secondary | ICD-10-CM | POA: Diagnosis not present

## 2023-11-18 DIAGNOSIS — Z8616 Personal history of COVID-19: Secondary | ICD-10-CM | POA: Diagnosis not present

## 2023-11-18 DIAGNOSIS — F418 Other specified anxiety disorders: Secondary | ICD-10-CM | POA: Diagnosis not present

## 2023-11-18 DIAGNOSIS — Z880 Allergy status to penicillin: Secondary | ICD-10-CM | POA: Diagnosis not present

## 2023-11-18 DIAGNOSIS — Z87891 Personal history of nicotine dependence: Secondary | ICD-10-CM

## 2023-11-18 DIAGNOSIS — G8918 Other acute postprocedural pain: Principal | ICD-10-CM | POA: Diagnosis present

## 2023-11-18 DIAGNOSIS — Z83438 Family history of other disorder of lipoprotein metabolism and other lipidemia: Secondary | ICD-10-CM | POA: Diagnosis not present

## 2023-11-18 DIAGNOSIS — M9689 Other intraoperative and postprocedural complications and disorders of the musculoskeletal system: Secondary | ICD-10-CM | POA: Diagnosis present

## 2023-11-18 DIAGNOSIS — Z96652 Presence of left artificial knee joint: Secondary | ICD-10-CM | POA: Diagnosis present

## 2023-11-18 DIAGNOSIS — Z6832 Body mass index (BMI) 32.0-32.9, adult: Secondary | ICD-10-CM | POA: Diagnosis not present

## 2023-11-18 DIAGNOSIS — Z791 Long term (current) use of non-steroidal anti-inflammatories (NSAID): Secondary | ICD-10-CM | POA: Diagnosis not present

## 2023-11-18 DIAGNOSIS — Z981 Arthrodesis status: Secondary | ICD-10-CM

## 2023-11-18 DIAGNOSIS — I10 Essential (primary) hypertension: Secondary | ICD-10-CM | POA: Diagnosis present

## 2023-11-18 LAB — CREATININE, SERUM
Creatinine, Ser: 0.78 mg/dL (ref 0.44–1.00)
GFR, Estimated: >60 mL/min (ref >60)

## 2023-11-18 LAB — CBC
HCT: 31.6 % — ABNORMAL LOW (ref 36.0–46.0)
Hemoglobin: 10.4 g/dL — ABNORMAL LOW (ref 12.0–15.0)
MCH: 30.6 pg (ref 26.0–34.0)
MCHC: 32.9 g/dL (ref 30.0–36.0)
MCV: 92.9 fL (ref 80.0–100.0)
Platelets: 174 K/uL (ref 150–400)
RBC: 3.4 MIL/uL — ABNORMAL LOW (ref 3.87–5.11)
RDW: 13.9 % (ref 11.5–15.5)
WBC: 6.2 K/uL (ref 4.0–10.5)
nRBC: 0 % (ref 0.0–0.2)

## 2023-11-18 MED ORDER — ONDANSETRON HCL 4 MG PO TABS
4.0000 mg | ORAL_TABLET | Freq: Four times a day (QID) | ORAL | Status: DC | PRN
  Administered 2023-11-29: 4 mg via ORAL
  Filled 2023-11-18: qty 1

## 2023-11-18 MED ORDER — KETOROLAC TROMETHAMINE 15 MG/ML IJ SOLN
15.0000 mg | Freq: Four times a day (QID) | INTRAMUSCULAR | Status: AC | PRN
  Administered 2023-11-18 – 2023-11-22 (×5): 15 mg via INTRAVENOUS
  Filled 2023-11-18 (×5): qty 1

## 2023-11-18 MED ORDER — ONDANSETRON HCL 4 MG/2ML IJ SOLN: 4.0000 mg | Freq: Four times a day (QID) | INTRAMUSCULAR | Status: DC | PRN

## 2023-11-18 MED ORDER — NYSTATIN 100000 UNIT/GM EX POWD
1.0000 | Freq: Two times a day (BID) | CUTANEOUS | Status: DC
  Administered 2023-11-18 – 2023-11-30 (×25): 1 via TOPICAL
  Filled 2023-11-18: qty 15

## 2023-11-18 MED ORDER — SODIUM CHLORIDE 0.9 % IV SOLN: 250.0000 mL | INTRAVENOUS | Status: AC

## 2023-11-18 MED ORDER — SODIUM CHLORIDE 0.9% FLUSH
3.0000 mL | Freq: Two times a day (BID) | INTRAVENOUS | Status: DC
  Administered 2023-11-18 – 2023-11-29 (×22): 3 mL via INTRAVENOUS

## 2023-11-18 MED ORDER — SODIUM CHLORIDE 0.9 % IV SOLN
INTRAVENOUS | Status: AC
Start: 2023-11-18 — End: 2023-11-19

## 2023-11-18 MED ORDER — VANCOMYCIN HCL IN DEXTROSE 1-5 GM/200ML-% IV SOLN: 1000.0000 mg | Freq: Three times a day (TID) | INTRAVENOUS | Status: DC

## 2023-11-18 MED ORDER — MORPHINE SULFATE (PF) 2 MG/ML IV SOLN
1.0000 mg | INTRAVENOUS | Status: DC | PRN
  Administered 2023-11-19 – 2023-11-29 (×26): 1 mg via INTRAVENOUS
  Filled 2023-11-18 (×27): qty 1

## 2023-11-18 MED ORDER — CYCLOBENZAPRINE HCL 5 MG PO TABS
5.0000 mg | ORAL_TABLET | Freq: Three times a day (TID) | ORAL | Status: AC | PRN
Start: 2023-11-18 — End: ?
  Administered 2023-11-19 – 2023-11-29 (×10): 5 mg via ORAL
  Filled 2023-11-18 (×10): qty 1

## 2023-11-18 MED ORDER — HYDROCODONE-ACETAMINOPHEN 5-325 MG PO TABS
1.0000 | ORAL_TABLET | ORAL | Status: DC | PRN
  Administered 2023-11-18 – 2023-11-29 (×21): 1 via ORAL
  Filled 2023-11-18 (×24): qty 1

## 2023-11-18 MED ORDER — ENOXAPARIN SODIUM 40 MG/0.4ML IJ SOSY
40.0000 mg | PREFILLED_SYRINGE | INTRAMUSCULAR | Status: DC
  Administered 2023-11-19 – 2023-11-30 (×11): 40 mg via SUBCUTANEOUS
  Filled 2023-11-18 (×11): qty 0.4

## 2023-11-18 MED ORDER — VANCOMYCIN HCL 1500 MG/300ML IV SOLN
1500.0000 mg | Freq: Once | INTRAVENOUS | Status: AC
  Administered 2023-11-18: 1500 mg via INTRAVENOUS
  Filled 2023-11-18: qty 300

## 2023-11-18 MED ORDER — BISACODYL 5 MG PO TBEC
5.0000 mg | DELAYED_RELEASE_TABLET | Freq: Every day | ORAL | Status: DC | PRN
  Administered 2023-11-22 – 2023-11-29 (×3): 5 mg via ORAL
  Filled 2023-11-18 (×3): qty 1

## 2023-11-18 MED ORDER — VANCOMYCIN HCL IN DEXTROSE 1-5 GM/200ML-% IV SOLN
1000.0000 mg | INTRAVENOUS | Status: DC
  Filled 2023-11-18: qty 200

## 2023-11-18 MED ORDER — SODIUM CHLORIDE 0.9% FLUSH: 3.0000 mL | INTRAVENOUS | Status: DC | PRN

## 2023-11-18 MED ORDER — MENTHOL 3 MG MT LOZG: 1.0000 | LOZENGE | OROMUCOSAL | Status: DC | PRN

## 2023-11-18 MED ORDER — DOCUSATE SODIUM 100 MG PO CAPS
100.0000 mg | ORAL_CAPSULE | Freq: Every day | ORAL | Status: DC | PRN
  Administered 2023-11-28 – 2023-11-30 (×2): 100 mg via ORAL
  Filled 2023-11-18: qty 1

## 2023-11-18 MED ORDER — PHENOL 1.4 % MT LIQD
1.0000 | OROMUCOSAL | Status: DC | PRN
Start: 2023-11-18 — End: 2023-11-30

## 2023-11-18 NOTE — H&P (Signed)
 Carol [name] is an 78 y.o. female.   Chief Complaint: Right leg pain HPI: 78 year old female recently status post L5-S1 anterior lumbar interbody fusion for which she initially did very well.  Patient reports pain radiating into her right lower extremity with standing or walking.  No numbness or weakness.  No left-sided symptoms.  No bowel or bladder dysfunction.  Patient with 1 episode of low-grade temperature elevation but no current fevers.  Patient also notes some irritation around her abdominal wound.  Her back pain is minimal.  She is not having any abdominal pain.  Workup at outside hospital demonstrated some superficial wound erythema.  Patient patient underwent CT scanning and MRI scanning which demonstrated normal postoperative change without evidence of obvious infection or major complicating features.  Patient transferred for pain control and evaluation.  Past Medical History:  Diagnosis Date   Anxiety    Arthritis    COVID-19 11/2020   symptom free since 12/08/20   Depression    Hypertension    reason that she was given [place]  & she remarks that her BP has been better she has been taking it.    Multilevel degenerative disc disease    Positive skin test for tuberculosis    told that she is a carrier, (treated age 6 and as adult) Negative x-ray(per pt)   Wears contact lenses    Wears dentures    partial upper   Wears hearing aid in right ear     Past Surgical History:  Procedure Laterality Date   ABDOMINAL EXPOSURE N/A 11/12/2023   Procedure: ABDOMINAL EXPOSURE;  Surgeon: Lanis Fonda BRAVO, MD;  Location: [facility] OR;  Service: Vascular;  Laterality: N/A;   ABDOMINAL HYSTERECTOMY     ANKLE ARTHROSCOPY Left 10/30/2019   Procedure: A-SCOPE/DEBRIDEMENT, EXTENSIVE, LEFT ANKLE;  Surgeon: Ashley Soulier, DPM;  Location: [facility] ORS;  Service: Podiatry;  Laterality: Left;   ANKLE RECONSTRUCTION Left 10/30/2019   Procedure: BROSTRUM-GOULD LEFT ANKLE;  Surgeon: Ashley Soulier, DPM;   Location: [facility] ORS;  Service: Podiatry;  Laterality: Left;   ANTERIOR LUMBAR FUSION N/A 11/12/2023   Procedure: ANTERIOR LUMBAR FUSION 1 LEVEL;  Surgeon: Louis Shove, MD;  Location: [facility] OR;  Service: Neurosurgery;  Laterality: N/A;  ALIF - L5-S1 with cage   BACK SURGERY     x3 previous cervical fusion & then removed hardware    BREAST SURGERY Right    benign-    CATARACT EXTRACTION W/PHACO Left 01/03/2021   Procedure: CATARACT EXTRACTION PHACO AND INTRAOCULAR LENS PLACEMENT (IOC) LEFT;  Surgeon: Myrna Adine Anes, MD;  Location: [facility];  Service: Ophthalmology;  Laterality: Left;  7.32 0:46.3   CATARACT EXTRACTION W/PHACO Right 01/17/2021   Procedure: CATARACT EXTRACTION PHACO AND INTRAOCULAR LENS PLACEMENT (IOC) RIGHT;  Surgeon: Myrna Adine Anes, MD;  Location: [facility];  Service: Ophthalmology;  Laterality: Right;  5.09 0:32.9   CHOLECYSTECTOMY  2021   HAND TENDON SURGERY Right 2016   JOINT REPLACEMENT Right    also had a partial on the L knee   LUMBAR FUSION     MYRINGOTOMY WITH TUBE PLACEMENT Right    POSTERIOR CERVICAL FUSION/FORAMINOTOMY N/A 12/26/2021   Procedure: Posterior cervical fusion with lateral mass fixation - C5-C6;  Surgeon: Louis Shove, MD;  Location: [facility] OR;  Service: Neurosurgery;  Laterality: N/A;   TONSILLECTOMY     TUBAL LIGATION     had a 2nd. surgery for adhesions    Family History  Problem Relation Age of Onset  Hypertension Mother    Hyperlipidemia Mother    Heart disease Mother    Aneurysm Mother    Hyperlipidemia Brother    Hypertension Brother    Lung cancer Brother    Social History:  reports that she quit smoking about 47 years ago. Her smoking use included cigarettes. She has never used smokeless tobacco. She reports that she does not currently use drugs. Frequency: 1.00 time per week. She reports that she does not drink alcohol .  Allergies:  Allergies  Allergen Reactions   Garamycin [Gentamicin] Itching, Dermatitis,  Rash and Other (See Comments)   Apresoline [Hydralazine] Nausea And Vomiting   Penicillins Hives   Boniva [Ibandronate] Other (See Comments)    Arthralgias  Tooth breaks, falling out   Celebrex [Celecoxib] Other (See Comments)    Arthralgias    Roxicodone  [Oxycodone ] Itching    OK to take with Benadryl     Medications Prior to Admission  Medication Sig Dispense Refill   albuterol  (VENTOLIN  HFA) 108 (90 Base) MCG/ACT inhaler Inhale 2 puffs into the lungs every 6 (six) hours as needed for wheezing or shortness of breath.     azelastine  (ASTELIN ) 0.1 % nasal spray Place 1 spray into the nose daily as needed for allergies.     cetirizine  (ZYRTEC ) 10 MG tablet Take 10 mg by mouth daily.     chlorthalidone  (HYGROTON ) 50 MG tablet Take 50 mg by mouth daily.     Cholecalciferol  (VITAMIN D3) 50 MCG (2000 UT) capsule Take 2,000 Units by mouth daily.     clonazePAM  (KLONOPIN ) 0.5 MG tablet Take 0.5 mg by mouth daily as needed for anxiety.      diclofenac Sodium (VOLTAREN) 1 % GEL Apply 1 Application topically 2 (two) times daily as needed (pain).     gabapentin  (NEURONTIN ) 100 MG capsule Take 100 mg by mouth daily.     HYDROcodone -acetaminophen  (NORCO) 10-325 MG tablet Take 1 tablet by mouth 4 (four) times daily as needed for severe pain (pain score 7-10). 40 tablet 0   methocarbamol  (ROBAXIN ) 500 MG tablet Take 1 tablet (500 mg total) by mouth every 6 (six) hours as needed for muscle spasms. 40 tablet 1   ondansetron  (ZOFRAN -ODT) 4 MG disintegrating tablet Take 4 mg by mouth every 8 (eight) hours as needed for vomiting or nausea.     polyethylene glycol powder (GLYCOLAX /MIRALAX ) 17 GM/SCOOP powder Take 17 g by mouth daily.     potassium chloride  SA (KLOR-CON  M) 20 MEQ tablet Take 20 mEq by mouth 2 (two) times daily.     pyridOXINE  (VITAMIN B-6) 100 MG tablet Take 100 mg by mouth daily.     rizatriptan  (MAXALT ) 10 MG tablet Take 10 mg by mouth daily as needed for migraine.     rosuvastatin  (CRESTOR )  5 MG tablet Take 1 tablet by mouth daily.     SUMAtriptan  (IMITREX ) 100 MG tablet Take 1 tablet (100 mg total) by mouth daily as needed for migraine. May repeat in 2 hours if headache persists or recurs. 10 tablet 2   traMADol  (ULTRAM ) 50 MG tablet Take 50 mg by mouth every 6 (six) hours as needed for moderate pain (pain score 4-6).     traZODone  (DESYREL ) 50 MG tablet Take 50 mg by mouth at bedtime as needed for sleep.     venlafaxine  XR (EFFEXOR -XR) 150 MG 24 hr capsule Take 150 mg by mouth daily.     venlafaxine  XR (EFFEXOR -XR) 75 MG 24 hr capsule TAKE 1 CAPSULE(75 MG) BY  MOUTH DAILY     zoledronic  acid (RECLAST ) 5 MG/100ML SOLN injection Inject 5 mg into the vein See admin instructions. Infuse 5mg  into the vein once every year      No results found for this or any previous visit (from the past 48 hours). No results found.  Pertinent items noted in HPI and remainder of comprehensive ROS otherwise negative.  Blood pressure (!) 118/59, pulse 82, temperature 98.6 F (37 C), temperature source Oral, resp. rate 18, weight 84 kg, SpO2 96%.  Patient is awake and alert.  She is oriented and appropriate.  She does not appear toxic or sick.  She is very comfortable lying in bed.  Cranial nerve function normal bilateral.  Motor examination intact in both upper and lower extremities.  Sensory examination normal bilaterally.  Straight leg raising negative bilaterally.  Surgical wound is well-approximated.  There are some superficial erythema around the Steri-Strips and wound which appear most consistent with Candida dermatitis.  No evidence of wound drainage.  Abdomen is soft and nontender.  I reviewed the patient's CT scan and MRI scan done in outside hospital.  These demonstrate postop changes L5-S1.  Her cage is still in good position without evidence of migration.  There is a question of some anterior superior fracturing of the anterior lip of the sacrum but this is difficult to definitively diagnose  given the hardware artifact.  There is nothing to suggest infectious pathology.  There is no reaction within the vertebral bodies of either L5 or S1.  There is no evidence of any new disc herniation or severe foraminal stenosis present. Assessment/Plan Overall I am not sure what to make of the patient's current situation.  I think this could be postoperative radiculitis secondary to swelling.  I have some concern there could be some S1 superior endplate fracturing but this is not definitive or clear on her imaging and given her lack of back pain I think this is unlikely.  I do not believe the patient has a significant wound infection.  I do believe that she has some surface Candida dermatitis but has no evidence of any deep wound space or superficial wound space infection on examination or by imaging.  As she has been started on antibiotics in the outside hospital we will continue this for 72 hours to make sure no other signs of infection are apparent.  We will treat her Candida dermatitis with topical nystatin .  Begin Toradol  for pain control and mobilize with therapy.  No indication for any operative intervention at present.  Carol [name] 11/18/2023, 9:16 AM

## 2023-11-18 NOTE — Progress Notes (Signed)
 Patient arrived via EMS she is alert, oriented, and on room air, pt oriented to room/ call bell, bed in low position, wheels locked, call bell within reach. The on call provider at [facility] paged to notify of pt arrival.

## 2023-11-18 NOTE — Progress Notes (Signed)
 Pharmacy Antibiotic Note  Carol [name] is a 78 y.o. female admitted on 11/18/2023 with cellulitis s/p L5-S1 anterior lumbar interbody fusion on 8/25.  Pharmacy has been consulted for vancomycin  dosing.  Last labs were 8/19 and renal function was normal. Bactrim  DS recently prescribed for 5 day course.  Plan: Vancomycin  1500 mg IV load then 1000 mg IV q24h Goal AUC 400-550, eAUC 547.1, SCr used 0.84, Vd 0.5 Monitor renal function, clinical progress, cultures/sensitivities F/U LOT and de-escalate as able Vancomycin  levels as clinically indicated   Weight: 84 kg (185 lb 3 oz)  Temp (24hrs), Avg:98.6 F (37 C), Min:98.6 F (37 C), Max:98.6 F (37 C)  No results for input(s): WBC, CREATININE, LATICACIDVEN, VANCOTROUGH, VANCOPEAK, VANCORANDOM, GENTTROUGH, GENTPEAK, GENTRANDOM, TOBRATROUGH, TOBRAPEAK, TOBRARND, AMIKACINPEAK, AMIKACINTROU, AMIKACIN in the last 168 hours.  Estimated Creatinine Clearance: 58.5 mL/min (by C-G formula based on SCr of 0.84 mg/dL).    Allergies  Allergen Reactions   Garamycin [Gentamicin] Itching, Dermatitis, Rash and Other (See Comments)   Apresoline [Hydralazine] Nausea And Vomiting   Penicillins Hives   Boniva [Ibandronate] Other (See Comments)    Arthralgias  Tooth breaks, falling out   Celebrex [Celecoxib] Other (See Comments)    Arthralgias    Roxicodone  [Oxycodone ] Itching    OK to take with Benadryl     Antimicrobials this admission: vancomycin  8/31 >>   Dose adjustments this admission:   Microbiology results:   Thank you for involving pharmacy in this patient's care.  Delon Sax, PharmD, BCPS Clinical Pharmacist Clinical phone for 11/18/2023 is [phone] 11/18/2023 9:16 AM

## 2023-11-19 DIAGNOSIS — G8918 Other acute postprocedural pain: Principal | ICD-10-CM | POA: Diagnosis present

## 2023-11-19 MED ORDER — VITAMIN B-6 100 MG PO TABS
100.0000 mg | ORAL_TABLET | Freq: Every day | ORAL | Status: DC
  Administered 2023-11-19 – 2023-11-29 (×10): 100 mg via ORAL
  Filled 2023-11-19 (×13): qty 1

## 2023-11-19 MED ORDER — ROSUVASTATIN CALCIUM 5 MG PO TABS
5.0000 mg | ORAL_TABLET | Freq: Every day | ORAL | Status: DC
  Administered 2023-11-19 – 2023-11-30 (×11): 5 mg via ORAL
  Filled 2023-11-19 (×11): qty 1

## 2023-11-19 MED ORDER — SUMATRIPTAN SUCCINATE 100 MG PO TABS: 100.0000 mg | ORAL_TABLET | Freq: Every day | ORAL | Status: DC | PRN

## 2023-11-19 MED ORDER — VENLAFAXINE HCL ER 150 MG PO CP24
150.0000 mg | ORAL_CAPSULE | Freq: Every day | ORAL | Status: DC
  Filled 2023-11-19: qty 1

## 2023-11-19 MED ORDER — ALBUTEROL SULFATE (2.5 MG/3ML) 0.083% IN NEBU: 2.5000 mg | INHALATION_SOLUTION | Freq: Four times a day (QID) | RESPIRATORY_TRACT | Status: DC | PRN

## 2023-11-19 MED ORDER — POTASSIUM CHLORIDE CRYS ER 20 MEQ PO TBCR
20.0000 meq | EXTENDED_RELEASE_TABLET | Freq: Two times a day (BID) | ORAL | Status: DC
  Administered 2023-11-19 – 2023-11-27 (×18): 20 meq via ORAL
  Filled 2023-11-19 (×18): qty 1

## 2023-11-19 MED ORDER — TRAZODONE HCL 50 MG PO TABS
50.0000 mg | ORAL_TABLET | Freq: Every evening | ORAL | Status: DC | PRN
  Filled 2023-11-19: qty 1

## 2023-11-19 MED ORDER — VENLAFAXINE HCL ER 75 MG PO CP24
225.0000 mg | ORAL_CAPSULE | Freq: Every day | ORAL | Status: DC
  Administered 2023-11-19 – 2023-11-30 (×12): 225 mg via ORAL
  Filled 2023-11-19 (×12): qty 1

## 2023-11-19 MED ORDER — VANCOMYCIN HCL 1000 MG IV SOLR
1000.0000 mg | INTRAVENOUS | Status: DC
  Administered 2023-11-19: 1000 mg via INTRAVENOUS
  Filled 2023-11-19 (×2): qty 20

## 2023-11-19 MED ORDER — CLONAZEPAM 0.5 MG PO TABS: 0.5000 mg | ORAL_TABLET | Freq: Every day | ORAL | Status: DC | PRN

## 2023-11-19 MED ORDER — CHLORTHALIDONE 25 MG PO TABS
50.0000 mg | ORAL_TABLET | Freq: Every day | ORAL | Status: DC
  Administered 2023-11-19 – 2023-11-30 (×11): 50 mg via ORAL
  Filled 2023-11-19 (×11): qty 2

## 2023-11-19 MED ORDER — LORATADINE 10 MG PO TABS
10.0000 mg | ORAL_TABLET | Freq: Every day | ORAL | Status: DC
  Administered 2023-11-19 – 2023-11-30 (×11): 10 mg via ORAL
  Filled 2023-11-19 (×11): qty 1

## 2023-11-19 MED ORDER — VENLAFAXINE HCL ER 75 MG PO CP24: 75.0000 mg | ORAL_CAPSULE | Freq: Every day | ORAL | Status: DC

## 2023-11-19 MED ORDER — ALBUTEROL SULFATE HFA 108 (90 BASE) MCG/ACT IN AERS: 2.0000 | INHALATION_SPRAY | Freq: Four times a day (QID) | RESPIRATORY_TRACT | Status: DC | PRN

## 2023-11-19 MED ORDER — GABAPENTIN 100 MG PO CAPS
100.0000 mg | ORAL_CAPSULE | Freq: Every day | ORAL | Status: DC
  Administered 2023-11-19 – 2023-11-30 (×11): 100 mg via ORAL
  Filled 2023-11-19 (×11): qty 1

## 2023-11-19 NOTE — Plan of Care (Signed)

## 2023-11-19 NOTE — Evaluation (Signed)
 Physical Therapy Evaluation Patient Details Name: Carol [name] MRN: [id number] DOB: Apr 07, 1945 Today's Date: 11/19/2023  History of Present Illness  Pt is 78 yo female admitted on 11/18/23 with pain radiating to R LE with standing and walking from outside hospital and transferred for pain control and evaluation. CT and MRI demonstrated normal postoperative change without evidence of obvious infection or major complicating features.  Pt was s/p L5-S1 fusion on 11/12/23 of which she initially did very well. Other hx includes but not limited to anxiety, arthritis, HTN, DDD, multiple prior lumbar and cervical surgeries.  Clinical Impression  Pt admitted with above diagnosis. At baseline, pt independent.  Since her back surgery she was initially doing well and ambulating with RW but then developed R LE pain and weakness so returned to hospital.  Pt reports doing some better than at admission but still R LE pain and weakness.  She was able to ambulate 300' with supervision, RW, and cues.  Expect pt to progress well in order to return home.  Pt currently with functional limitations due to the deficits listed below (see PT Problem List). Pt will benefit from acute skilled PT to increase their independence and safety with mobility to allow discharge.           If plan is discharge home, recommend the following: Assist for transportation;Assistance with cooking/housework   Can travel by private vehicle        Equipment Recommendations None recommended by PT  Recommendations for Other Services       Functional Status Assessment Patient has had a recent decline in their functional status and demonstrates the ability to make significant improvements in function in a reasonable and predictable amount of time.     Precautions / Restrictions Precautions Precautions: Back Precaution Booklet Issued: Yes (comment) Recall of Precautions/Restrictions: Intact Precaution/Restrictions Comments: per orders, no  brace needed Restrictions Other Position/Activity Restrictions: Has brace at home for comfort      Mobility  Bed Mobility Overal bed mobility: Needs Assistance Bed Mobility: Supine to Sit     Supine to sit: Supervision     General bed mobility comments: cues for log rolling    Transfers Overall transfer level: Needs assistance Equipment used: Rolling walker (2 wheels) Transfers: Sit to/from Stand Sit to Stand: Supervision           General transfer comment: Good hand placement    Ambulation/Gait Ambulation/Gait assistance: Contact guard assist, Supervision Gait Distance (Feet): 300 Feet Assistive device: Rolling walker (2 wheels) Gait Pattern/deviations: Step-through pattern, Trunk flexed Gait velocity: decreased but functional     General Gait Details: Started with CGA progressing to supervision.  Min cues for RW proximity and posture.  Does report increased pain in R buttock when standing straight  Stairs            Wheelchair Mobility     Tilt Bed    Modified Rankin (Stroke Patients Only)       Balance Overall balance assessment: Needs assistance Sitting-balance support: Feet supported, No upper extremity supported Sitting balance-Leahy Scale: Good     Standing balance support: No upper extremity supported, During functional activity, Bilateral upper extremity supported Standing balance-Leahy Scale: Fair Standing balance comment: RW to ambulate but could stand without support                             Pertinent Vitals/Pain Pain Assessment Pain Assessment: 0-10 Pain Score: 7  Pain  Location: back Pain Descriptors / Indicators: Discomfort Pain Intervention(s): Limited activity within patient's tolerance, Monitored during session, Premedicated before session, Repositioned, Ice applied    Home Living Family/patient expects to be discharged to:: Private residence Living Arrangements: Alone Available Help at Discharge:  Family;Available PRN/intermittently Type of Home: Other(Comment) (condo) Home Access: Level entry       Home Layout: One level Home Equipment: Cane - single point;Shower seat - built in;Grab bars - toilet;Grab bars - tub/shower;Other (comment);Hand held shower head;Rolling Walker (2 wheels)      Prior Function Prior Level of Function : Independent/Modified Independent;Driving             Mobility Comments: Amb with cane at baseline, RW since initial back surgery (11/12/23).  Reports initially was doing well after surgery.  Had hx of sciatica and pain down bil legs that improved after surgery but returned in R LE resulting in difficulty moving and R LE buckling - presented back to hospital. ADLs Comments: indep with ADLs, IADLs, driving. Since back sx, pt able to manage ADLs, laundry, ordered groceries online and family brought in/put them away     Extremity/Trunk Assessment   Upper Extremity Assessment Upper Extremity Assessment: Defer to OT evaluation    Lower Extremity Assessment Lower Extremity Assessment: LLE deficits/detail;RLE deficits/detail RLE Deficits / Details: ROM WFL; MMT at least 3/5 but not further tested due to pain and acuity of back sx LLE Deficits / Details: ROM WFL; MMT at least 3/5 but not further tested due to pain and acuity of back sx    Cervical / Trunk Assessment Cervical / Trunk Assessment: Back Surgery  Communication   Communication Communication: Impaired Factors Affecting Communication: Hearing impaired    Cognition Arousal: Alert Behavior During Therapy: WFL for tasks assessed/performed   PT - Cognitive impairments: No apparent impairments                                 Cueing       General Comments General comments (skin integrity, edema, etc.): Pt with hx of multiple back surgeries; good understanding of precautions    Exercises     Assessment/Plan    PT Assessment Patient needs continued PT services  PT Problem  List Decreased strength;Decreased activity tolerance;Decreased balance;Decreased knowledge of use of DME;Decreased mobility       PT Treatment Interventions DME instruction;Gait training;Stair training;Functional mobility training;Therapeutic activities;Patient/family education;Neuromuscular re-education;Balance training;Therapeutic exercise;Modalities    PT Goals (Current goals can be found in the Care Plan section)  Acute Rehab PT Goals Patient Stated Goal: return home PT Goal Formulation: With patient Time For Goal Achievement: 12/03/23 Potential to Achieve Goals: Good    Frequency Min 1X/week     Co-evaluation               AM-PAC PT 6 Clicks Mobility  Outcome Measure Help needed turning from your back to your side while in a flat bed without using bedrails?: A Little Help needed moving from lying on your back to sitting on the side of a flat bed without using bedrails?: A Little Help needed moving to and from a bed to a chair (including a wheelchair)?: A Little Help needed standing up from a chair using your arms (e.g., wheelchair or bedside chair)?: A Little Help needed to walk in hospital room?: A Little Help needed climbing 3-5 steps with a railing? : A Little 6 Click Score: 18  End of Session Equipment Utilized During Treatment: Gait belt Activity Tolerance: Patient tolerated treatment well Patient left: with call bell/phone within reach;in chair (knows to call for assist) Nurse Communication: Mobility status PT Visit Diagnosis: Difficulty in walking, not elsewhere classified (R26.2)    Time: 8859-8847 (dove tail wtih OT) PT Time Calculation (min) (ACUTE ONLY): 12 min   Charges:   PT Evaluation $PT Eval Low Complexity: 1 Low   PT General Charges $$ ACUTE PT VISIT: 1 Visit         Carol, PT Acute Rehab Services [facility] [phone]   Carol [name] 11/19/2023, 12:26 PM

## 2023-11-19 NOTE — Progress Notes (Signed)
 Assessment 78 y/o F, hx previous lumbar fusion who underwent L5-S1 standalone ALIF on 8/25. Re-admitted with RLE pain and irritation to her abdominal wound. Being treated empirically with abx and nystatin  and analgesia   LOS: 1 day    Plan: Continue vancomycin  and nystatin  AAT PTOT DAT DVT ppx   Subjective: Walked in the hallway today with therapy. nae  Objective: Vital signs in last 24 hours: Temp:  [97.8 F (36.6 C)-99 F (37.2 C)] 97.9 F (36.6 C) (09/01 0745) Pulse Rate:  [73-91] 87 (09/01 0745) Resp:  [16-19] 17 (09/01 0745) BP: (116-138)/(53-66) 116/53 (09/01 0745) SpO2:  [97 %-100 %] 97 % (09/01 0745)  Intake/Output from previous day: 08/31 0701 - 09/01 0700 In: 660 [P.O.:360; IV Piggyback:300] Out: -  Intake/Output this shift: Total I/O In: 240 [P.O.:240] Out: -   Exam: 5/5 BUE and BLE throughout  Lab Results: Recent Labs    11/18/23 1204  WBC 6.2  HGB 10.4*  HCT 31.6*  PLT 174   BMET Recent Labs    11/18/23 1204  CREATININE 0.78    Studies/Results: No results found.    Dorn JONELLE Glade 11/19/2023, 11:56 AM

## 2023-11-19 NOTE — Evaluation (Signed)
 Occupational Therapy Evaluation Patient Details Name: Carol [name] MRN: [id number] DOB: 09-14-1945 Today's Date: 11/19/2023   History of Present Illness   Pt is 78 yo female admitted on 11/18/23 with pain radiating to R LE with standing and walking from outside hospital and transferred for pain control and evaluation. CT and MRI demonstrated normal postoperative change without evidence of obvious infection or major complicating features.  Pt was s/p L5-S1 fusion on 11/12/23 of which she initially did very well. Other hx includes but not limited to anxiety, arthritis, HTN, DDD, multiple prior lumbar and cervical surgeries.     Clinical Impressions PTA, pt lives alone, typically Modified Independent with ADLs, IADLs and mobility using a cane. Since back surgery, pt had still been doing well and using a RW for mobility. Pt presents now with post op pain but reports some improvements in RLE pain/function. Pt able to demonstrate the functional skills to manage ADLs w/o assist and mobilize in room using RW without safety concerns. Will follow distantly for acute OT if pt is to remain admitted though anticipate no OT needs upon DC.     If plan is discharge home, recommend the following:   Assist for transportation (PRN)     Functional Status Assessment   Patient has had a recent decline in their functional status and demonstrates the ability to make significant improvements in function in a reasonable and predictable amount of time.     Equipment Recommendations   None recommended by OT     Recommendations for Other Services         Precautions/Restrictions   Precautions Precautions: Back Precaution Booklet Issued: Yes (comment) Recall of Precautions/Restrictions: Intact Precaution/Restrictions Comments: per orders, no brace needed Restrictions Weight Bearing Restrictions Per Provider Order: No     Mobility Bed Mobility Overal bed mobility: Modified Independent              General bed mobility comments: cues for log rolling    Transfers Overall transfer level: Modified independent Equipment used: Rolling walker (2 wheels)                      Balance Overall balance assessment: Needs assistance Sitting-balance support: Feet supported, No upper extremity supported Sitting balance-Leahy Scale: Good     Standing balance support: No upper extremity supported, During functional activity, Bilateral upper extremity supported Standing balance-Leahy Scale: Fair                             ADL either performed or assessed with clinical judgement   ADL Overall ADL's : Modified independent                                       General ADL Comments: pt reports managing various ADLs already this AM, declined need for further ADL retraining at this time. Able to manage mobility using RW well without LOB or safety concerns.     Vision Baseline Vision/History: 1 Wears glasses Ability to See in Adequate Light: 0 Adequate Patient Visual Report: No change from baseline Vision Assessment?: No apparent visual deficits     Perception         Praxis         Pertinent Vitals/Pain Pain Assessment Pain Assessment: 0-10 Pain Score: 7  Pain Location: back Pain Descriptors / Indicators: Discomfort Pain Intervention(s):  Monitored during session     Extremity/Trunk Assessment Upper Extremity Assessment Upper Extremity Assessment: Defer to OT evaluation   Lower Extremity Assessment Lower Extremity Assessment: LLE deficits/detail;RLE deficits/detail RLE Deficits / Details: ROM WFL; MMT at least 3/5 but not further tested due to pain and acuity of back sx LLE Deficits / Details: ROM WFL; MMT at least 3/5 but not further tested due to pain and acuity of back sx   Cervical / Trunk Assessment Cervical / Trunk Assessment: Back Surgery   Communication Communication Communication: Impaired Factors Affecting  Communication: Hearing impaired   Cognition Arousal: Alert Behavior During Therapy: WFL for tasks assessed/performed Cognition: No apparent impairments                               Following commands: Intact       Cueing  General Comments   Cueing Techniques: Verbal cues  Pt with hx of multiple back surgeries; good understanding of precautions   Exercises     Shoulder Instructions      Home Living Family/patient expects to be discharged to:: Private residence Living Arrangements: Alone Available Help at Discharge: Family;Available PRN/intermittently Type of Home: Other(Comment) (condo) Home Access: Level entry     Home Layout: One level     Bathroom Shower/Tub: Walk-in shower;Door   [employer] Toilet: Handicapped height Bathroom Accessibility: Yes How Accessible: Accessible via walker Home Equipment: Cane - single point;Shower seat - built in;Grab bars - toilet;Grab bars - tub/shower;Other (comment);Hand held shower head;Rolling Walker (2 wheels)          Prior Functioning/Environment Prior Level of Function : Independent/Modified Independent;Driving             Mobility Comments: Amb with cane at baseline, RW since initial back surgery (11/12/23).  Reports initially was doing well after surgery.  Had hx of sciatica and pain down bil legs that improved after surgery but returned in R LE resulting in difficulty moving and R LE buckling - presented back to hospital. ADLs Comments: indep with ADLs, IADLs, driving. Since back sx, pt able to manage ADLs, laundry, ordered groceries online and family brought in/put them away    OT Problem List: Decreased activity tolerance;Impaired balance (sitting and/or standing);Pain   OT Treatment/Interventions: Self-care/ADL training;Therapeutic exercise;Energy conservation;DME and/or AE instruction;Therapeutic activities;Patient/family education;Balance training      OT Goals(Current goals can be found in the  care plan section)   Acute Rehab OT Goals Patient Stated Goal: recover, decrease pain OT Goal Formulation: With patient Time For Goal Achievement: 12/03/23 Potential to Achieve Goals: Good ADL Goals Additional ADL Goal #1: Pt to demonstrate full ADLs with Modified Independence with implementation of back precautions Additional ADL Goal #2: Pt to increase standing activity tolerance > 10 min during ADLs, IADLs and mobility   OT Frequency:  Min 1X/week    Co-evaluation              AM-PAC OT 6 Clicks Daily Activity     Outcome Measure Help from another person eating meals?: None Help from another person taking care of personal grooming?: None Help from another person toileting, which includes using toliet, bedpan, or urinal?: None Help from another person bathing (including washing, rinsing, drying)?: None Help from another person to put on and taking off regular upper body clothing?: None Help from another person to put on and taking off regular lower body clothing?: None 6 Click Score: 24   End of  Session Equipment Utilized During Treatment: Gait belt;Rolling walker (2 wheels) Nurse Communication: Mobility status  Activity Tolerance: Patient tolerated treatment well Patient left: Other (comment) (to ambulate with PT)  OT Visit Diagnosis: Pain Pain - part of body:  (back)                Time: 8874-8854 OT Time Calculation (min): 20 min Charges:  OT General Charges $OT Visit: 1 Visit OT Evaluation $OT Eval Low Complexity: 1 Low  Mliss NOVAK, OTR/L Acute Rehab Services Office: [phone]   Mliss Fish 11/19/2023, 1:14 PM

## 2023-11-20 NOTE — Plan of Care (Signed)

## 2023-11-20 NOTE — Progress Notes (Signed)
 Mobility Specialist Progress Note:    11/20/23 1100  Mobility  Activity Ambulated with assistance  Level of Assistance Contact guard assist, steadying assist  Assistive Device Front wheel walker  Distance Ambulated (ft) 300 ft  Activity Response Tolerated well  Mobility Referral Yes  Mobility visit 1 Mobility  Mobility Specialist Start Time (ACUTE ONLY) 1040  Mobility Specialist Stop Time (ACUTE ONLY) 1053  Mobility Specialist Time Calculation (min) (ACUTE ONLY) 13 min   Received pt in bed having no complaints and agreeable to mobility. Pt was asymptomatic throughout ambulation and returned to room w/o fault. Left in chair w/ call bell in reach and all needs met.   Thersia Minder Mobility Specialist  Please contact vis Secure Chat or  Rehab Office [phone]

## 2023-11-20 NOTE — Progress Notes (Signed)
 Overall looks good.  Patient noted that she had some right buttocks pain when arising from bed this morning.  This is better now.  She is having no numbness paresthesias or weakness.  Her back pain is minimal.  She is having no wound drainage.  Her bowel function is good.  She is afebrile.  Her vital signs are stable.  She is awake and alert.  She is oriented and appropriate.  Wound with some surrounding erythema consistent with Candida dermatitis.  No evidence of wound infection.  No evidence of subcutaneous collection.  Motor and sensory function of the extremities are normal.  Abdomen soft.  Chest benign.  No evidence of wound infection.  Patient with some superficial Candida dermatitis.  Continue nystatin  powder.  Still with some back pain with mobilization.  Review of her prior CT scan shows a questionable possible superior endplate fracture of her sacrum.  We will follow this over time.  Because the patient has such a long drive back home plan to observe for 1 more day.  If she is stable tomorrow plan for discharge home.  No need for further antibiotics and her vancomycin  has been discontinued.

## 2023-11-21 ENCOUNTER — Inpatient Hospital Stay (HOSPITAL_COMMUNITY)

## 2023-11-21 ENCOUNTER — Other Ambulatory Visit: Payer: Self-pay | Admitting: Neurosurgery

## 2023-11-21 NOTE — Plan of Care (Signed)
  Problem: Education: Goal: Knowledge of General Education information will improve Description: Including pain rating scale, medication(s)/side effects and non-pharmacologic comfort measures Outcome: Progressing   Problem: Clinical Measurements: Goal: Will remain free from infection Outcome: Progressing Goal: Respiratory complications will improve Outcome: Progressing Goal: Cardiovascular complication will be avoided Outcome: Progressing   Problem: Pain Managment: Goal: General experience of comfort will improve and/or be controlled Outcome: Not Progressing   Problem: Skin Integrity: Goal: Risk for impaired skin integrity will decrease Outcome: Not Progressing

## 2023-11-21 NOTE — Progress Notes (Signed)
 PT Cancellation Note  Patient Details Name: TEMIMA KUTSCH MRN: [id number] DOB: [birth year]   Cancelled Treatment:    Reason Eval/Treat Not Completed: (P) Patient at procedure or test/unavailable (pt off unit for imaging) Will continue efforts per PT plan of care as schedule permits.   Connell HERO Betzaida Cremeens 11/21/2023, 9:33 AM

## 2023-11-21 NOTE — Progress Notes (Signed)
 Upright lumbar x-rays reveal fracturing through the superior aspect of her sacrum with anterior migration of her interbody cage and worsening spondylolisthesis at L5-S1.  Patient continues to have back pain with some radicular symptoms while standing.  Patient has failed her prior anterior lumbar fusion.  I have discussed situation with the patient.  I have recommended that we move forward with posterior lumbosacral fusion utilizing pedicle screw and pelvic fixation for stabilization of her unstable L5-S1 segment.  Plan for surgery on Monday.

## 2023-11-21 NOTE — Progress Notes (Signed)
 Mobility Specialist Progress Note:    11/21/23 1212  Mobility  Activity Ambulated with assistance  Level of Assistance Contact guard assist, steadying assist  Assistive Device Front wheel walker  Distance Ambulated (ft) 300 ft  Activity Response Tolerated well  Mobility Referral Yes  Mobility visit 1 Mobility  Mobility Specialist Start Time (ACUTE ONLY) 1050  Mobility Specialist Stop Time (ACUTE ONLY) 1101  Mobility Specialist Time Calculation (min) (ACUTE ONLY) 11 min   Pt received in bed asleep but easily aroused and agreeable to mobility. Pt c/o strong RLE pain, pt believes it is her sciatic pain, otherwise tolerated well. Returned to room w/o fault. Left in chair w/ call bell and personal belongings in reach. All needs met. RN aware.  Thersia Minder Mobility Specialist  Please contact vis Secure Chat or  Rehab Office [phone]

## 2023-11-21 NOTE — Progress Notes (Signed)
 Physical Therapy Treatment Patient Details Name: Carol [name] MRN: [id number] DOB: 01-Aug-1945 Today's Date: 11/21/2023   History of Present Illness Pt is 78 yo female admitted on 11/18/23 with pain radiating to R LE with standing and walking from outside hospital and transferred for pain control and evaluation. CT and MRI demonstrated normal postoperative change without evidence of obvious infection or major complicating features. Pt was s/p L5-S1 fusion on 11/12/23 of which she initially did very well. Follow-up lumbosacral x-ray 9/3 with MD note  recommended that we move forward with posterior lumbosacral fusion utilizing pedicle screw and pelvic fixation for stabilization of her unstable L5-S1 segment. Plan for surgery on Monday 9/8. PMH includes anxiety, arthritis, HTN, DDD, multiple prior lumbar and cervical surgeries.    PT Comments  Pt received in recliner, pt agreeable to therapy session and requesting return to supine due to moderate to severe R lower back pain. Pt needing up to CGA for safety with transfers and bed mobility, needing reminder to prevent fwd flexion >90 degrees with transfers and for safer UE placement. Reinforced log roll technique for bed mobility. Pt continues to benefit from PT services to progress toward functional mobility goals, per chart review surgeon plans for laminectomy on 9/8, surgeon messaged to clarify and reports no new restrictions, will continue to follow.     If plan is discharge home, recommend the following: Assist for transportation;Assistance with cooking/housework;A little help with walking and/or transfers   Can travel by private vehicle        Equipment Recommendations  None recommended by PT    Recommendations for Other Services       Precautions / Restrictions Precautions Precautions: Back Precaution Booklet Issued: Yes (comment) Recall of Precautions/Restrictions: Intact Precaution/Restrictions Comments: per orders, no brace needed, she  has brace at home for comfort, she may have family bring to hospital if she wishes. Restrictions Weight Bearing Restrictions Per Provider Order: No     Mobility  Bed Mobility Overal bed mobility: Needs Assistance Bed Mobility: Sit to Sidelying, Rolling Rolling: Supervision       Sit to sidelying: Contact guard assist, Used rails General bed mobility comments: cues for log rolling and CGA for safety to avoid twisting, no LE lift assist needed over EOB    Transfers Overall transfer level: Needs assistance Equipment used: Rolling walker (2 wheels) Transfers: Sit to/from Stand Sit to Stand: Supervision           General transfer comment: cues needed for better UE placement and to avoid excessive fwd flexion of trunk with sit>stand    Ambulation/Gait Ambulation/Gait assistance: Contact guard assist Gait Distance (Feet): 20 Feet Assistive device: Rolling walker (2 wheels) Gait Pattern/deviations: Step-through pattern, Trunk flexed, Decreased stride length       General Gait Details: pt c/o increased moderate to severe pain and back/R side, cues for safety with UE proximity and to avoid twisting when turning; pt self-limiting distance due to complaint of pain and pt slightly anxious due to recently speaking with surgeon who recommends additional surgery on her back.   Stairs Stairs:  (pt defers due to pain)           Wheelchair Mobility     Tilt Bed    Modified Rankin (Stroke Patients Only)       Balance Overall balance assessment: Needs assistance Sitting-balance support: Feet supported, No upper extremity supported Sitting balance-Leahy Scale: Good     Standing balance support: During functional activity, Bilateral upper extremity supported, [place]  on assistive device for balance, Single extremity supported Standing balance-Leahy Scale: Poor Standing balance comment: RW due to pain today                            Communication  Communication Communication: Impaired Factors Affecting Communication: Hearing impaired (pt reports batteries are dead in her HA, family to bring more)  Cognition Arousal: Alert Behavior During Therapy: WFL for tasks assessed/performed, Impulsive   PT - Cognitive impairments: No apparent impairments, Safety/Judgement                       PT - Cognition Comments: needs reminders for precautions and body mechanics as pt tending to flex forward past 90 deg with STS prior to being cued. Following commands: Intact      Cueing Cueing Techniques: Verbal cues, Tactile cues  Exercises      General Comments General comments (skin integrity, edema, etc.): pt given ice pack once back in supine for R lower back/hip region, recommend on/off t/o the day 10-20 mins on only before removing      Pertinent Vitals/Pain Pain Assessment Pain Assessment: 0-10 Pain Score: 7  Pain Location: back Pain Descriptors / Indicators: Discomfort, Grimacing, Guarding Pain Intervention(s): Limited activity within patient's tolerance, Monitored during session, Repositioned, Patient requesting pain meds-RN notified, Ice applied    Home Living                          Prior Function            PT Goals (current goals can now be found in the care plan section) Acute Rehab PT Goals Patient Stated Goal: Less pain so I can return home PT Goal Formulation: With patient Time For Goal Achievement: 12/03/23 Progress towards PT goals: Progressing toward goals    Frequency    Min 1X/week      PT Plan      Co-evaluation              AM-PAC PT 6 Clicks Mobility   Outcome Measure  Help needed turning from your back to your side while in a flat bed without using bedrails?: A Little Help needed moving from lying on your back to sitting on the side of a flat bed without using bedrails?: A Little Help needed moving to and from a bed to a chair (including a wheelchair)?: A Little Help  needed standing up from a chair using your arms (e.g., wheelchair or bedside chair)?: A Little Help needed to walk in hospital room?: A Little Help needed climbing 3-5 steps with a railing? : A Lot 6 Click Score: 17    End of Session Equipment Utilized During Treatment: Gait belt Activity Tolerance: Patient limited by pain Patient left: in bed;with call bell/phone within reach;with bed alarm set;Other (comment) (pillow under BLE to reduce back discomfort, ice under her R hip/lower back) Nurse Communication: Mobility status;Patient requests pain meds;Precautions;Other (comment) Oncologist and RN secure chat after recent imaging and surgeon rec no new restrictions) PT Visit Diagnosis: Difficulty in walking, not elsewhere classified (R26.2)     Time: 8681-8668 PT Time Calculation (min) (ACUTE ONLY): 13 min  Charges:    $Therapeutic Activity: 8-22 mins PT General Charges $$ ACUTE PT VISIT: 1 Visit                     Shatia Sindoni P., PTA Acute Rehabilitation  Services Secure Chat Preferred 9a-5:30pm Office: [phone]    Connell HERO [facility] 11/21/2023, 3:06 PM

## 2023-11-21 NOTE — Progress Notes (Signed)
 Patient is lumbar pain worse today.  Having some intermittent radiating pain into her right lower extremity.  Awake and alert.  Oriented and appropriate.  She is afebrile.  Her wound looks good.  Abdomen soft.  I remain concerned about possible superior sacral fracture.  Plan to get follow-up x-rays today with her both supine and upright.  If these demonstrate any signs of instability then I would recommend posterior instrumentation at L5-S1.

## 2023-11-21 NOTE — Plan of Care (Signed)
   Problem: Education: Goal: Knowledge of General Education information will improve Description: Including pain rating scale, medication(s)/side effects and non-pharmacologic comfort measures Outcome: Progressing   Problem: Clinical Measurements: Goal: Diagnostic test results will improve Outcome: Progressing   Problem: Activity: Goal: Risk for activity intolerance will decrease Outcome: Progressing   Problem: Pain Managment: Goal: General experience of comfort will improve and/or be controlled Outcome: Progressing

## 2023-11-22 NOTE — Progress Notes (Signed)
 Occupational Therapy Treatment Patient Details Name: Carol [name] MRN: [id number] DOB: [birth year] Today's Date: 11/22/2023   History of present illness Pt is 78 yo female admitted on 11/18/23 with pain radiating to R LE with standing and walking from outside hospital and transferred for pain control and evaluation. CT and MRI demonstrated normal postoperative change without evidence of obvious infection or major complicating features. Pt was s/p L5-S1 fusion on 11/12/23 of which she initially did very well. Follow-up lumbosacral x-ray 9/3 with MD note  recommended that we move forward with posterior lumbosacral fusion utilizing pedicle screw and pelvic fixation for stabilization of her unstable L5-S1 segment. Plan for surgery on Monday 9/8. PMH includes anxiety, arthritis, HTN, DDD, multiple prior lumbar and cervical surgeries.   OT comments  Despite new findings on imaging and pending further back surgery, pt reporting pain as tolerable this AM and remains able to manage ADLs/in room mobility using RW with Modified Independence. Pt with good implementation of back precautions and self monitoring for safety. Will follow up for OT re-eval post op to ensure pt able to safely DC home as recommended.       If plan is discharge home, recommend the following:  Assist for transportation (PRN)   Equipment Recommendations  None recommended by OT    Recommendations for Other Services      Precautions / Restrictions Precautions Precautions: Back Precaution Booklet Issued: Yes (comment) Recall of Precautions/Restrictions: Intact Precaution/Restrictions Comments: per orders, no brace needed, she has brace at home for comfort, she may have family bring to hospital if she wishes. Restrictions Weight Bearing Restrictions Per Provider Order: No       Mobility Bed Mobility Overal bed mobility: Modified Independent                  Transfers Overall transfer level: Modified  independent Equipment used: Rolling walker (2 wheels) Transfers: Sit to/from Stand Sit to Stand: Modified independent (Device/Increase time)                 Balance Overall balance assessment: Needs assistance Sitting-balance support: Feet supported, No upper extremity supported Sitting balance-Leahy Scale: Good     Standing balance support: During functional activity, Bilateral upper extremity supported, Single extremity supported, No upper extremity supported Standing balance-Leahy Scale: Fair                             ADL either performed or assessed with clinical judgement   ADL Overall ADL's : Modified independent                                       General ADL Comments: Assessed OOB ADLs after new findings on imaging and documented increased pain during admission. Despite new findings and pending further surgery, pt able to manage grooming and bathing standing at sink, as well as LB dressing with Modified Independence. Pt reports pain as tolerable during ADLs.    Extremity/Trunk Assessment Upper Extremity Assessment Upper Extremity Assessment: Overall WFL for tasks assessed;Right hand dominant   Lower Extremity Assessment Lower Extremity Assessment: Defer to PT evaluation        Vision   Vision Assessment?: No apparent visual deficits   Perception     Praxis     Communication Communication Communication: Impaired   Cognition Arousal: Alert Behavior During Therapy: WFL for tasks assessed/performed, Impulsive  Cognition: No apparent impairments                               Following commands: Intact        Cueing   Cueing Techniques: Verbal cues, Tactile cues  Exercises      Shoulder Instructions       General Comments      Pertinent Vitals/ Pain       Pain Assessment Pain Assessment: Faces Faces Pain Scale: Hurts a little bit Pain Location: R leg Pain Descriptors / Indicators: Grimacing Pain  Intervention(s): Monitored during session, Limited activity within patient's tolerance  Home Living                                          Prior Functioning/Environment              Frequency  Min 1X/week        Progress Toward Goals  OT Goals(current goals can now be found in the care plan section)  Progress towards OT goals: Progressing toward goals  Acute Rehab OT Goals Patient Stated Goal: for back issues to resolve and surgery to be successful OT Goal Formulation: With patient Time For Goal Achievement: 12/03/23 Potential to Achieve Goals: Good ADL Goals Additional ADL Goal #1: Pt to demonstrate full ADLs with Modified Independence with implementation of back precautions Additional ADL Goal #2: Pt to increase standing activity tolerance > 10 min during ADLs, IADLs and mobility  Plan      Co-evaluation                 AM-PAC OT 6 Clicks Daily Activity     Outcome Measure   Help from another person eating meals?: None Help from another person taking care of personal grooming?: None Help from another person toileting, which includes using toliet, bedpan, or urinal?: None Help from another person bathing (including washing, rinsing, drying)?: None Help from another person to put on and taking off regular upper body clothing?: None Help from another person to put on and taking off regular lower body clothing?: None 6 Click Score: 24    End of Session Equipment Utilized During Treatment: Rolling walker (2 wheels)  OT Visit Diagnosis: Pain Pain - part of body:  (back)   Activity Tolerance Patient tolerated treatment well   Patient Left in chair;with call bell/phone within reach   Nurse Communication Mobility status (discussed with NT)        Time: 9081-9064 OT Time Calculation (min): 17 min  Charges: OT General Charges $OT Visit: 1 Visit OT Treatments $Self Care/Home Management : 8-22 mins  Mliss NOVAK, OTR/L Acute Rehab  Services Office: [phone]   Mliss Fish 11/22/2023, 9:43 AM

## 2023-11-22 NOTE — Plan of Care (Signed)
  Problem: Education: Goal: Knowledge of General Education information will improve Description: Including pain rating scale, medication(s)/side effects and non-pharmacologic comfort measures Outcome: Progressing   Problem: Clinical Measurements: Goal: Ability to maintain clinical measurements within normal limits will improve Outcome: Progressing Goal: Respiratory complications will improve Outcome: Progressing Goal: Cardiovascular complication will be avoided Outcome: Progressing   Problem: Pain Managment: Goal: General experience of comfort will improve and/or be controlled Outcome: Not Progressing   Problem: Skin Integrity: Goal: Risk for impaired skin integrity will decrease Outcome: Not Progressing

## 2023-11-22 NOTE — Progress Notes (Signed)
 OT Cancellation Note  Patient Details Name: Carol [name] MRN: [id number] DOB: 06-Dec-1945   Cancelled Treatment:    Reason Eval/Treat Not Completed: Other (comment) Pt eating breakfast; agreeable for OT to follow up later.  Mliss Fish 11/22/2023, 8:57 AM

## 2023-11-22 NOTE — Care Management Important Message (Signed)
 Important Message  Patient Details  Name: Carol [name] MRN: [id number] Date of Birth: 1945/11/14   Important Message Given:  Yes - Medicare IM     Jon Cruel 11/22/2023, 2:06 PM

## 2023-11-22 NOTE — Progress Notes (Signed)
 Mobility Specialist Progress Note:    11/22/23 1212  Mobility  Activity Ambulated with assistance  Level of Assistance Standby assist, set-up cues, supervision of patient - no hands on  Assistive Device Front wheel walker  Distance Ambulated (ft) 150 ft  Activity Response Tolerated well  Mobility Referral Yes  Mobility visit 1 Mobility  Mobility Specialist Start Time (ACUTE ONLY) [id number]  Mobility Specialist Stop Time (ACUTE ONLY) 1000  Mobility Specialist Time Calculation (min) (ACUTE ONLY) 17 min   Pt received in bed agreeable to mobility. No physical assistance required. Distance limited d/t back pain, otherwise tolerated well. Returned to room w/o fault. Call bell and personal belongings in reach. All needs met. Left in bed.  Thersia Minder Mobility Specialist  Please contact vis Secure Chat or  Rehab Office [phone]

## 2023-11-23 ENCOUNTER — Inpatient Hospital Stay (HOSPITAL_COMMUNITY)

## 2023-11-23 MED FILL — Morphine Sulfate Inj 4 MG/ML: INTRAMUSCULAR | Qty: 1 | Status: AC

## 2023-11-23 NOTE — Plan of Care (Signed)

## 2023-11-23 NOTE — Progress Notes (Signed)
 Overall stable.  Patient reasonably comfortable sitting up in a chair right now.  Still with some right lower extremity pain and worsening back pain with standing.  No current numbness paresthesias or weakness.  CT scan of the lumbar spine was performed today for preoperative planning.  This demonstrates fracturing through the anterior third of her sacral S1 segment.  The cage is somewhat dislodged and she has a little bit more spondylolisthesis but overall she reduces reasonably well with recumbency.  There is no evidence of significant central foraminal stenosis on her CT scan.  I discussed the findings of the CT scan with the patient.  Once again her plan is for sacropelvic fixation tied into her existing L2-L5 instrumentation.  I discussed the risks and the benefits.  Patient wishes proceed with surgery on Monday.

## 2023-11-23 NOTE — H&P (View-Only) (Signed)
 Overall stable.  Patient reasonably comfortable sitting up in a chair right now.  Still with some right lower extremity pain and worsening back pain with standing.  No current numbness paresthesias or weakness.  CT scan of the lumbar spine was performed today for preoperative planning.  This demonstrates fracturing through the anterior third of her sacral S1 segment.  The cage is somewhat dislodged and she has a little bit more spondylolisthesis but overall she reduces reasonably well with recumbency.  There is no evidence of significant central foraminal stenosis on her CT scan.  I discussed the findings of the CT scan with the patient.  Once again her plan is for sacropelvic fixation tied into her existing L2-L5 instrumentation.  I discussed the risks and the benefits.  Patient wishes proceed with surgery on Monday.

## 2023-11-24 NOTE — Plan of Care (Signed)
   Problem: Activity: Goal: Risk for activity intolerance will decrease Outcome: Progressing   Problem: Nutrition: Goal: Adequate nutrition will be maintained Outcome: Progressing   Problem: Pain Managment: Goal: General experience of comfort will improve and/or be controlled Outcome: Progressing

## 2023-11-24 NOTE — Plan of Care (Signed)

## 2023-11-24 NOTE — Progress Notes (Signed)
 Mobility Specialist: Progress Note   11/24/23 1559  Mobility  Activity Ambulated with assistance  Level of Assistance Standby assist, set-up cues, supervision of patient - no hands on  Assistive Device Front wheel walker  Distance Ambulated (ft) 80 ft  Activity Response Tolerated well  Mobility Referral Yes  Mobility visit 1 Mobility  Mobility Specialist Start Time (ACUTE ONLY) 1146  Mobility Specialist Stop Time (ACUTE ONLY) 1154  Mobility Specialist Time Calculation (min) (ACUTE ONLY) 8 min    Pt received in chair. Pleasant but hesitantly agreeable to mobility session. SV throughout. C/o onset of pain when she stands and start walking. Ambulated down the hallway and back without fault. Left in chair with all needs met, call bell in reach.    Carol [name] Mobility Specialist Please contact via SecureChat or Rehab office at [phone]

## 2023-11-25 LAB — CREATININE, SERUM
Creatinine, Ser: 0.97 mg/dL (ref 0.44–1.00)
GFR, Estimated: >60 mL/min (ref >60)

## 2023-11-25 NOTE — Plan of Care (Signed)

## 2023-11-25 NOTE — Plan of Care (Signed)
  Problem: Clinical Measurements: Goal: Will remain free from infection Outcome: Progressing   Problem: Activity: Goal: Risk for activity intolerance will decrease Outcome: Progressing   Problem: Nutrition: Goal: Adequate nutrition will be maintained Outcome: Progressing   Problem: Pain Managment: Goal: General experience of comfort will improve and/or be controlled Outcome: Progressing

## 2023-11-25 NOTE — Progress Notes (Signed)
 Patient ID: Carol [name], female   DOB: 1945-10-13, 78 y.o.   MRN: [id number] Patient is doing well postoperatively with moderate back pain.  She is to undergo more surgery tomorrow for a posterior decompression and fixation.  Encouraged ambulation is much as tolerated.

## 2023-11-26 ENCOUNTER — Inpatient Hospital Stay (HOSPITAL_COMMUNITY): Admitting: Anesthesiology

## 2023-11-26 ENCOUNTER — Encounter (HOSPITAL_COMMUNITY): Payer: Self-pay | Admitting: Neurosurgery

## 2023-11-26 ENCOUNTER — Inpatient Hospital Stay (HOSPITAL_COMMUNITY)

## 2023-11-26 ENCOUNTER — Other Ambulatory Visit: Payer: Self-pay

## 2023-11-26 ENCOUNTER — Encounter (HOSPITAL_COMMUNITY)
Admission: EM | Disposition: A | Discharge status: Home / Self Care | Payer: Self-pay | Source: Home / Self Care | Attending: Neurosurgery

## 2023-11-26 DIAGNOSIS — I10 Essential (primary) hypertension: Secondary | ICD-10-CM

## 2023-11-26 DIAGNOSIS — M960 Pseudarthrosis after fusion or arthrodesis: Secondary | ICD-10-CM | POA: Diagnosis not present

## 2023-11-26 DIAGNOSIS — Z87891 Personal history of nicotine dependence: Secondary | ICD-10-CM | POA: Diagnosis not present

## 2023-11-26 DIAGNOSIS — F418 Other specified anxiety disorders: Secondary | ICD-10-CM | POA: Diagnosis not present

## 2023-11-26 HISTORY — PX: LAMINECTOMY WITH POSTERIOR LATERAL ARTHRODESIS LEVEL 1: SHX6335

## 2023-11-26 LAB — TYPE AND SCREEN
ABO/RH(D): A POS
Antibody Screen: NEGATIVE

## 2023-11-26 LAB — BASIC METABOLIC PANEL WITH GFR
Anion gap: 13 (ref 5–15)
BUN: 11 mg/dL (ref 8–23)
CO2: 28 mmol/L (ref 22–32)
Calcium: 9.6 mg/dL (ref 8.9–10.3)
Chloride: 96 mmol/L — ABNORMAL LOW (ref 98–111)
Creatinine, Ser: 1 mg/dL (ref 0.44–1.00)
GFR, Estimated: 58 mL/min — ABNORMAL LOW (ref >60)
Glucose, Bld: 90 mg/dL (ref 70–99)
Potassium: 3.2 mmol/L — ABNORMAL LOW (ref 3.5–5.1)
Sodium: 137 mmol/L (ref 135–145)

## 2023-11-26 SURGERY — LAMINECTOMY WITH POSTERIOR LATERAL ARTHRODESIS LEVEL 1
Anesthesia: General | Site: Back

## 2023-11-26 MED ORDER — HYDROMORPHONE HCL 1 MG/ML IJ SOLN
INTRAMUSCULAR | Status: AC
  Filled 2023-11-26: qty 0.5

## 2023-11-26 MED ORDER — LACTATED RINGERS IV SOLN: INTRAVENOUS | Status: DC

## 2023-11-26 MED ORDER — SODIUM CHLORIDE 0.9% FLUSH
3.0000 mL | Freq: Two times a day (BID) | INTRAVENOUS | Status: DC
  Administered 2023-11-26 – 2023-11-29 (×7): 3 mL via INTRAVENOUS

## 2023-11-26 MED ORDER — CHLORHEXIDINE GLUCONATE 0.12 % MT SOLN
OROMUCOSAL | Status: AC
  Administered 2023-11-26: 15 mL via OROMUCOSAL
  Filled 2023-11-26: qty 15

## 2023-11-26 MED ORDER — ONDANSETRON HCL 4 MG/2ML IJ SOLN
INTRAMUSCULAR | Status: DC | PRN
  Administered 2023-11-26: 4 mg via INTRAVENOUS

## 2023-11-26 MED ORDER — KETAMINE HCL 50 MG/5ML IJ SOSY
PREFILLED_SYRINGE | INTRAMUSCULAR | Status: DC | PRN
  Administered 2023-11-26: 20 mg via INTRAVENOUS
  Administered 2023-11-26: 10 mg via INTRAVENOUS

## 2023-11-26 MED ORDER — THROMBIN 20000 UNITS EX SOLR
CUTANEOUS | Status: AC
  Filled 2023-11-26: qty 20000

## 2023-11-26 MED ORDER — SODIUM CHLORIDE 0.9% FLUSH: 3.0000 mL | INTRAVENOUS | Status: DC | PRN

## 2023-11-26 MED ORDER — ONDANSETRON HCL 4 MG/2ML IJ SOLN: 4.0000 mg | Freq: Once | INTRAMUSCULAR | Status: DC | PRN

## 2023-11-26 MED ORDER — VANCOMYCIN HCL 1000 MG IV SOLR
INTRAVENOUS | Status: AC
  Filled 2023-11-26: qty 20

## 2023-11-26 MED ORDER — ACETAMINOPHEN 10 MG/ML IV SOLN
INTRAVENOUS | Status: DC | PRN
  Administered 2023-11-26: 1000 mg via INTRAVENOUS

## 2023-11-26 MED ORDER — HYDROMORPHONE HCL 1 MG/ML IJ SOLN
1.0000 mg | INTRAMUSCULAR | Status: DC | PRN
  Administered 2023-11-26 – 2023-11-30 (×18): 1 mg via INTRAVENOUS
  Filled 2023-11-26 (×18): qty 1

## 2023-11-26 MED ORDER — CEFAZOLIN SODIUM-DEXTROSE 1-4 GM/50ML-% IV SOLN
1.0000 g | Freq: Three times a day (TID) | INTRAVENOUS | Status: AC
  Administered 2023-11-26 – 2023-11-27 (×2): 1 g via INTRAVENOUS
  Filled 2023-11-26 (×2): qty 50

## 2023-11-26 MED ORDER — HYDROCODONE-ACETAMINOPHEN 7.5-325 MG PO TABS: 1.0000 | ORAL_TABLET | Freq: Once | ORAL | Status: DC | PRN

## 2023-11-26 MED ORDER — THROMBIN 20000 UNITS EX SOLR
CUTANEOUS | Status: DC | PRN
  Administered 2023-11-26: 20 mL via TOPICAL

## 2023-11-26 MED ORDER — FENTANYL CITRATE (PF) 250 MCG/5ML IJ SOLN
INTRAMUSCULAR | Status: AC
Start: 2023-11-26 — End: 2023-11-26
  Filled 2023-11-26: qty 5

## 2023-11-26 MED ORDER — ACETAMINOPHEN 650 MG RE SUPP: 650.0000 mg | RECTAL | Status: DC | PRN

## 2023-11-26 MED ORDER — BUPIVACAINE HCL (PF) 0.25 % IJ SOLN
INTRAMUSCULAR | Status: DC | PRN
  Administered 2023-11-26: 30 mL

## 2023-11-26 MED ORDER — FENTANYL CITRATE (PF) 250 MCG/5ML IJ SOLN
INTRAMUSCULAR | Status: DC | PRN
  Administered 2023-11-26: 150 ug via INTRAVENOUS
  Administered 2023-11-26: 50 ug via INTRAVENOUS

## 2023-11-26 MED ORDER — FLEET ENEMA RE ENEM: 1.0000 | ENEMA | Freq: Once | RECTAL | Status: DC | PRN

## 2023-11-26 MED ORDER — BUPIVACAINE HCL (PF) 0.25 % IJ SOLN
INTRAMUSCULAR | Status: AC
  Filled 2023-11-26: qty 30

## 2023-11-26 MED ORDER — SODIUM CHLORIDE 0.9 % IV SOLN: 250.0000 mL | INTRAVENOUS | Status: AC

## 2023-11-26 MED ORDER — LIDOCAINE 2% (20 MG/ML) 5 ML SYRINGE
INTRAMUSCULAR | Status: DC | PRN
  Administered 2023-11-26: 60 mg via INTRAVENOUS

## 2023-11-26 MED ORDER — CHLORHEXIDINE GLUCONATE 0.12 % MT SOLN: 15.0000 mL | Freq: Once | OROMUCOSAL | Status: AC

## 2023-11-26 MED ORDER — VANCOMYCIN HCL 1000 MG IV SOLR
INTRAVENOUS | Status: DC | PRN
  Administered 2023-11-26: 1000 mg

## 2023-11-26 MED ORDER — CHLORHEXIDINE GLUCONATE CLOTH 2 % EX PADS
6.0000 | MEDICATED_PAD | Freq: Once | CUTANEOUS | Status: AC
  Administered 2023-11-26: 6 via TOPICAL

## 2023-11-26 MED ORDER — ROCURONIUM BROMIDE 10 MG/ML (PF) SYRINGE
PREFILLED_SYRINGE | INTRAVENOUS | Status: AC
Start: 2023-11-26 — End: 2023-11-26
  Filled 2023-11-26: qty 10

## 2023-11-26 MED ORDER — HYDROMORPHONE HCL 1 MG/ML IJ SOLN
0.2500 mg | INTRAMUSCULAR | Status: DC | PRN
  Administered 2023-11-26: 0.5 mg via INTRAVENOUS

## 2023-11-26 MED ORDER — CEFAZOLIN SODIUM-DEXTROSE 2-4 GM/100ML-% IV SOLN
2.0000 g | INTRAVENOUS | Status: AC
  Administered 2023-11-26: 2 g via INTRAVENOUS
  Filled 2023-11-26: qty 100

## 2023-11-26 MED ORDER — ORAL CARE MOUTH RINSE: 15.0000 mL | Freq: Once | OROMUCOSAL | Status: AC

## 2023-11-26 MED ORDER — DEXAMETHASONE SODIUM PHOSPHATE 10 MG/ML IJ SOLN
INTRAMUSCULAR | Status: DC | PRN
  Administered 2023-11-26: 5 mg via INTRAVENOUS

## 2023-11-26 MED ORDER — DROPERIDOL 2.5 MG/ML IJ SOLN: 0.6250 mg | Freq: Once | INTRAMUSCULAR | Status: DC | PRN

## 2023-11-26 MED ORDER — ACETAMINOPHEN 10 MG/ML IV SOLN
INTRAVENOUS | Status: AC
  Filled 2023-11-26: qty 100

## 2023-11-26 MED ORDER — PROPOFOL 10 MG/ML IV BOLUS
INTRAVENOUS | Status: DC | PRN
  Administered 2023-11-26: 120 mg via INTRAVENOUS

## 2023-11-26 MED ORDER — PHENYLEPHRINE 80 MCG/ML (10ML) SYRINGE FOR IV PUSH (FOR BLOOD PRESSURE SUPPORT)
PREFILLED_SYRINGE | INTRAVENOUS | Status: DC | PRN
  Administered 2023-11-26: 80 ug via INTRAVENOUS
  Administered 2023-11-26: 120 ug via INTRAVENOUS

## 2023-11-26 MED ORDER — KETAMINE HCL 50 MG/5ML IJ SOSY
PREFILLED_SYRINGE | INTRAMUSCULAR | Status: AC
  Filled 2023-11-26: qty 5

## 2023-11-26 MED ORDER — HYDROMORPHONE HCL 1 MG/ML IJ SOLN
INTRAMUSCULAR | Status: AC
  Filled 2023-11-26: qty 1

## 2023-11-26 MED ORDER — HYDROMORPHONE HCL 1 MG/ML IJ SOLN
INTRAMUSCULAR | Status: DC | PRN
  Administered 2023-11-26 (×2): .25 mg via INTRAVENOUS

## 2023-11-26 MED ORDER — PROPOFOL 10 MG/ML IV BOLUS
INTRAVENOUS | Status: AC
  Filled 2023-11-26: qty 20

## 2023-11-26 MED ORDER — ROCURONIUM BROMIDE 10 MG/ML (PF) SYRINGE
PREFILLED_SYRINGE | INTRAVENOUS | Status: DC | PRN
  Administered 2023-11-26: 70 mg via INTRAVENOUS

## 2023-11-26 MED ORDER — 0.9 % SODIUM CHLORIDE (POUR BTL) OPTIME
TOPICAL | Status: DC | PRN
  Administered 2023-11-26: 1000 mL

## 2023-11-26 MED ORDER — VANCOMYCIN HCL 750 MG/150ML IV SOLN
750.0000 mg | Freq: Two times a day (BID) | INTRAVENOUS | Status: DC
Start: 2023-11-27 — End: 2023-11-29
  Administered 2023-11-27 – 2023-11-29 (×5): 750 mg via INTRAVENOUS
  Filled 2023-11-26 (×6): qty 150

## 2023-11-26 MED ORDER — LIDOCAINE 2% (20 MG/ML) 5 ML SYRINGE
INTRAMUSCULAR | Status: AC
  Filled 2023-11-26: qty 5

## 2023-11-26 MED ORDER — PHENYLEPHRINE 80 MCG/ML (10ML) SYRINGE FOR IV PUSH (FOR BLOOD PRESSURE SUPPORT)
PREFILLED_SYRINGE | INTRAVENOUS | Status: AC
  Filled 2023-11-26: qty 10

## 2023-11-26 MED ORDER — ACETAMINOPHEN 325 MG PO TABS
650.0000 mg | ORAL_TABLET | ORAL | Status: DC | PRN
  Administered 2023-11-28 – 2023-11-29 (×5): 650 mg via ORAL
  Filled 2023-11-26 (×5): qty 2

## 2023-11-26 SURGICAL SUPPLY — 52 items
BAG COUNTER SPONGE SURGICOUNT (BAG) ×3 IMPLANT
BENZOIN TINCTURE PRP APPL 2/3 (GAUZE/BANDAGES/DRESSINGS) ×3 IMPLANT
BLADE CLIPPER SURG (BLADE) IMPLANT
BUR 14 MATCH 3 (BUR) IMPLANT
BUR CUTTER 7.0 ROUND (BURR) IMPLANT
BUR MATCHSTICK NEURO 3.0 LAGG (BURR) ×3 IMPLANT
BUR MR8 14 BALL 5 (BUR) IMPLANT
CANISTER SUCTION 3000ML PPV (SUCTIONS) ×3 IMPLANT
CNTNR URN SCR LID CUP LEK RST (MISCELLANEOUS) ×3 IMPLANT
COVER BACK TABLE 60X90IN (DRAPES) ×3 IMPLANT
DERMABOND ADVANCED .7 DNX12 (GAUZE/BANDAGES/DRESSINGS) ×3 IMPLANT
DRAPE C-ARM 42X72 X-RAY (DRAPES) ×6 IMPLANT
DRAPE HALF SHEET 40X57 (DRAPES) IMPLANT
DRAPE LAPAROTOMY 100X72X124 (DRAPES) ×3 IMPLANT
DRAPE SHEET LG 3/4 BI-LAMINATE (DRAPES) ×12 IMPLANT
DRAPE SURG 17X23 STRL (DRAPES) ×12 IMPLANT
DRSG OPSITE POSTOP 4X8 (GAUZE/BANDAGES/DRESSINGS) IMPLANT
DURAPREP 26ML APPLICATOR (WOUND CARE) ×3 IMPLANT
ELECTRODE REM PT RTRN 9FT ADLT (ELECTROSURGICAL) ×3 IMPLANT
EVACUATOR 1/8 PVC DRAIN (DRAIN) ×3 IMPLANT
FEE COVERAGE SUPPORT O-ARM (MISCELLANEOUS) ×3 IMPLANT
GAUZE 4X4 16PLY ~~LOC~~+RFID DBL (SPONGE) IMPLANT
GAUZE SPONGE 4X4 12PLY STRL (GAUZE/BANDAGES/DRESSINGS) ×3 IMPLANT
GLOVE ECLIPSE 9.0 STRL (GLOVE) ×6 IMPLANT
GLOVE EXAM NITRILE XL STR (GLOVE) IMPLANT
GOWN STRL REUS W/ TWL LRG LVL3 (GOWN DISPOSABLE) ×3 IMPLANT
GOWN STRL REUS W/ TWL XL LVL3 (GOWN DISPOSABLE) ×6 IMPLANT
GOWN STRL REUS W/TWL 2XL LVL3 (GOWN DISPOSABLE) IMPLANT
GRAFT BN 5X1XSPNE CVD POST DBM (Bone Implant) IMPLANT
HEMOSTAT POWDER KIT SURGIFOAM (HEMOSTASIS) IMPLANT
KIT BASIN OR (CUSTOM PROCEDURE TRAY) ×3 IMPLANT
KIT INFUSE SMALL (Orthopedic Implant) IMPLANT
KIT TURNOVER KIT B (KITS) ×3 IMPLANT
MARKER SPHERE PSV REFLC NDI (MISCELLANEOUS) ×15 IMPLANT
NDL HYPO 22X1.5 SAFETY MO (MISCELLANEOUS) ×3 IMPLANT
NEEDLE HYPO 22X1.5 SAFETY MO (MISCELLANEOUS) ×2 IMPLANT
NS IRRIG 1000ML POUR BTL (IV SOLUTION) ×3 IMPLANT
PACK LAMINECTOMY NEURO (CUSTOM PROCEDURE TRAY) ×3 IMPLANT
ROD SPINAL 5.5X100MM CP 4 TI (Cage) IMPLANT
SCREW BS CNCMA S 8.5X80 T/C (Screw) IMPLANT
SET SCREW SPINE 5.5X6 TI (Screw) IMPLANT
SPIKE FLUID TRANSFER (MISCELLANEOUS) ×3 IMPLANT
SPONGE SURGIFOAM ABS GEL 100 (HEMOSTASIS) ×3 IMPLANT
STRIP CLOSURE SKIN 1/2X4 (GAUZE/BANDAGES/DRESSINGS) ×6 IMPLANT
SUT VIC AB 0 CT1 18XCR BRD8 (SUTURE) ×6 IMPLANT
SUT VIC AB 2-0 CT1 18 (SUTURE) ×3 IMPLANT
SUT VIC AB 3-0 SH 8-18 (SUTURE) ×6 IMPLANT
TOWEL GREEN STERILE (TOWEL DISPOSABLE) ×3 IMPLANT
TOWEL GREEN STERILE FF (TOWEL DISPOSABLE) ×3 IMPLANT
TRAY FOLEY MTR SLVR 14FR STAT (SET/KITS/TRAYS/PACK) IMPLANT
TRAY FOLEY MTR SLVR 16FR STAT (SET/KITS/TRAYS/PACK) ×3 IMPLANT
WATER STERILE IRR 1000ML POUR (IV SOLUTION) ×3 IMPLANT

## 2023-11-26 NOTE — Progress Notes (Addendum)
 Pharmacy Antibiotic Note  REAGYN FACEMIRE is a 78 y.o. female s/p spinal procedure with a drain in place. Pharmacy has been consulted for vancomycin  dosing.  -Vancomycin  750mg  IV given at 2:10pm -CrCl ~ 50  Plan: -Vancomycin  1000mg  IV q12h -Will follow plans for drain removal  Height: 5' 3 (160 cm) Weight: 82.6 kg (182 lb) IBW/kg (Calculated) : 52.4  Temp (24hrs), Avg:98.3 F (36.8 C), Min:97.5 F (36.4 C), Max:98.6 F (37 C)  Recent Labs  Lab 11/25/23 0447 11/26/23 0944  CREATININE 0.97 1.00    Estimated Creatinine Clearance: 48.7 mL/min (by C-G formula based on SCr of 1 mg/dL).    Allergies  Allergen Reactions   Garamycin [Gentamicin] Itching, Dermatitis, Rash and Other (See Comments)   Apresoline [Hydralazine] Nausea And Vomiting   Penicillins Hives   Boniva [Ibandronate] Other (See Comments)    Arthralgias  Tooth breaks, falling out   Celebrex [Celecoxib] Other (See Comments)    Arthralgias    Roxicodone  [Oxycodone ] Itching    OK to take with Benadryl     Thank you for allowing pharmacy to be a part of this patient's care.  Prentice Poisson, PharmD Clinical Pharmacist **Pharmacist phone directory can now be found on [email or web address] ([email or web address]).  Listed under [facility].

## 2023-11-26 NOTE — Brief Op Note (Signed)
 11/18/2023 - 11/26/2023  2:20 PM  PATIENT:  [name]  78 y.o. female  PRE-OPERATIVE DIAGNOSIS:  Spondylolisthesis lumbar region  POST-OPERATIVE DIAGNOSIS:  Spondylolisthesis lumbar region  PROCEDURE:  Procedure(s): POSTERIOR LUMBAR  ARTHRODESIS LUMBAR FIVE-SACRAL ONE WITH ILIAC FIXATION WITH OARM (N/A) APPLICATION OF O-ARM (N/A)  SURGEON:  Surgeons and Role:    DEWAINE Louis Shove, MD - Primary  PHYSICIAN ASSISTANT:   ASSISTANTSBETHA Jennetta PIETY   ANESTHESIA:   general  EBL:  150 mL   BLOOD ADMINISTERED:none  DRAINS: (med) Hemovact drain(s) in the deep wound space with  Suction Open   LOCAL MEDICATIONS USED:  MARCAINE      SPECIMEN:  No Specimen  DISPOSITION OF SPECIMEN:  N/A  COUNTS:  YES  TOURNIQUET:  * No tourniquets in log *  DICTATION: .Dragon Dictation  PLAN OF CARE: Admit to inpatient   PATIENT DISPOSITION:  PACU - hemodynamically stable.   Delay start of Pharmacological VTE agent (>24hrs) due to surgical blood loss or risk of bleeding: yes

## 2023-11-26 NOTE — Interval H&P Note (Signed)
 History and Physical Interval Note:  11/26/2023 10:12 AM  Carol [name]  has presented today for surgery, with the diagnosis of Spondylolisthesis lumbar region.  The various methods of treatment have been discussed with the patient and family. After consideration of risks, benefits and other options for treatment, the patient has consented to  Procedure(s) with comments: LAMINECTOMY WITH POSTERIOR LATERAL ARTHRODESIS LEVEL 1 (N/A) - L5-S1 PLA with Iliac Fixation and O-Arm APPLICATION OF O-ARM (N/A) as a surgical intervention.  The patient's history has been reviewed, patient examined, no change in status, stable for surgery.  I have reviewed the patient's chart and labs.  Questions were answered to the patient's satisfaction.     [facility] Marley Pakula

## 2023-11-26 NOTE — Anesthesia Postprocedure Evaluation (Signed)
 Anesthesia Post Note  Patient: Carol [name]  Procedure(s) Performed: POSTERIOR LUMBAR  ARTHRODESIS LUMBAR FIVE-SACRAL ONE WITH ILIAC FIXATION WITH OARM (Back) APPLICATION OF O-ARM     Patient location during evaluation: PACU Anesthesia Type: General Level of consciousness: awake and alert and oriented Pain management: pain level controlled Vital Signs Assessment: post-procedure vital signs reviewed and stable Respiratory status: spontaneous breathing, nonlabored ventilation and respiratory function stable Cardiovascular status: blood pressure returned to baseline and stable Postop Assessment: no apparent nausea or vomiting Anesthetic complications: no   No notable events documented.  Last Vitals:  Vitals:   11/26/23 1515 11/26/23 1534  BP: 120/71 131/76  Pulse: 94   Resp: 15 18  Temp: 37 C (!) 36.4 C  SpO2: 93% 97%    Last Pain:  Vitals:   11/26/23 1515  TempSrc:   PainSc: Asleep   Pain Goal: Patients Stated Pain Goal: 0 (11/25/23 0826)                 Kessa Fairbairn A.

## 2023-11-26 NOTE — Progress Notes (Signed)
 Orthopedic Tech Progress Note Patient Details:  Carol [name] November 14, 1945 [id number]  Patient has back brace, given by me on the 25th of last month  Patient ID: [name], female   DOB: 1945/07/18, 78 y.o.   MRN: [id number]  Delanna LITTIE Pac 11/26/2023, 3:53 PM

## 2023-11-26 NOTE — Transfer of Care (Signed)
 Immediate Anesthesia Transfer of Care Note  Patient: Carol [name]  Procedure(s) Performed: POSTERIOR LUMBAR  ARTHRODESIS LUMBAR FIVE-SACRAL ONE WITH ILIAC FIXATION WITH OARM (Back) APPLICATION OF O-ARM  Patient Location: PACU  Anesthesia Type:General  Level of Consciousness: awake, alert , and oriented  Airway & Oxygen Therapy: Patient Spontanous Breathing and Patient connected to face mask oxygen  Post-op Assessment: Report given to RN and Post -op Vital signs reviewed and stable  Post vital signs: Reviewed and stable  Last Vitals:  Vitals Value Taken Time  BP 138/86 11/26/23 14:45  Temp 37 C 11/26/23 14:42  Pulse 99 11/26/23 14:48  Resp 19 11/26/23 14:48  SpO2 93 % 11/26/23 14:48  Vitals shown include unfiled device data.  Last Pain:  Vitals:   11/26/23 1442  TempSrc:   PainSc: 10-Worst pain ever      Patients Stated Pain Goal: 0 (11/25/23 0826)  Complications: No notable events documented.

## 2023-11-26 NOTE — Anesthesia Procedure Notes (Signed)
 Procedure Name: Intubation Date/Time: 11/26/2023 11:42 AM  Performed by: Vera Rochele PARAS, CRNAPre-anesthesia Checklist: Patient identified, Emergency Drugs available, Suction available and Patient being monitored Patient Re-evaluated:Patient Re-evaluated prior to induction Oxygen Delivery Method: Circle System Utilized Preoxygenation: Pre-oxygenation with 100% oxygen Induction Type: IV induction Ventilation: Mask ventilation without difficulty Laryngoscope Size: Glidescope and 3 Grade View: Grade I Tube type: Oral Tube size: 7.0 mm Number of attempts: 1 Airway Equipment and Method: Stylet and Oral airway Placement Confirmation: ETT inserted through vocal cords under direct vision, positive ETCO2 and breath sounds checked- equal and bilateral Secured at: 20 cm Tube secured with: Tape Dental Injury: Teeth and Oropharynx as per pre-operative assessment

## 2023-11-26 NOTE — Anesthesia Preprocedure Evaluation (Addendum)
 Anesthesia Evaluation  Patient identified by MRN, date of birth, ID band Patient awake    Reviewed: Allergy & Precautions, NPO status , Patient's Chart, lab work & pertinent test results  Airway Mallampati: II  TM Distance: >3 FB Neck ROM: Limited    Dental  (+) Dental Advisory Given, Edentulous Upper, Partial Lower   Pulmonary former smoker   breath sounds clear to auscultation + decreased breath sounds      Cardiovascular hypertension, Pt. on medications Normal cardiovascular exam Rhythm:Regular Rate:Normal  EKG 11/06/23 NSR, Normal  Echo 05/15/22  1. Left ventricular ejection fraction, by estimation, is 60 to 65%. The  left ventricle has normal function. The left ventricle has no regional  wall motion abnormalities. There is mild left ventricular hypertrophy.  Left ventricular diastolic parameters  are consistent with Grade II diastolic dysfunction (pseudonormalization).   2. Right ventricular systolic function is normal. The right ventricular  size is normal. There is normal pulmonary artery systolic pressure.   3. The mitral valve is normal in structure. No evidence of mitral valve  regurgitation.   4. The aortic valve is grossly normal. Aortic valve regurgitation is not  visualized.   5. The inferior vena cava is normal in size with greater than 50%  respiratory variability, suggesting right atrial pressure of 3 mmHg.     Neuro/Psych  Headaches PSYCHIATRIC DISORDERS Anxiety Depression    Lumbar radiculopathy Right sided sciatica HOH right ear- wears hearing aid  Neuromuscular disease    GI/Hepatic negative GI ROS, Neg liver ROS,,,  Endo/Other  Hyperlipidemia Obesity  Renal/GU negative Renal ROS  negative genitourinary   Musculoskeletal  (+) Arthritis , Osteoarthritis,  Fibromyalgia -DDD lumbar spine S/P ALIF and laminectomy Spondylolisthesis L5-S1 S/P cervical fusion   Abdominal  (+) + obese  Peds   Hematology  (+) Blood dyscrasia, anemia   Anesthesia Other Findings   Reproductive/Obstetrics                              Anesthesia Physical Anesthesia Plan  ASA: 3  Anesthesia Plan: General   Post-op Pain Management: Dilaudid  IV, Ketamine  IV*, Precedex and Ofirmev  IV (intra-op)*   Induction: Intravenous  PONV Risk Score and Plan: Treatment may vary due to age or medical condition, Ondansetron , Dexamethasone , Midazolam  and Propofol  infusion  Airway Management Planned: Video Laryngoscope Planned and Oral ETT  Additional Equipment: None  Intra-op Plan:   Post-operative Plan: Extubation in OR  Informed Consent: I have reviewed the patients History and Physical, chart, labs and discussed the procedure including the risks, benefits and alternatives for the proposed anesthesia with the patient or authorized representative who has indicated his/her understanding and acceptance.     Dental advisory given  Plan Discussed with: CRNA and Anesthesiologist  Anesthesia Plan Comments:          Anesthesia Quick Evaluation

## 2023-11-26 NOTE — Op Note (Signed)
 Date of procedure: 11/26/2023  Date of dictation: Same  Service: Neurosurgery  Preoperative diagnosis: L5-S1 pseudoarthrosis secondary to S1 fracture  Postoperative diagnosis: Same  Procedure Name: Reexploration of L2-L5 posterolateral fusion with removal of hardware  L5-S1 posterior lateral arthrodesis utilizing bone returning protein and morselized allograft  Placement of bilateral S2 alar iliac screws with revision instrumentation from L3-S2 iliac wing utilizing O-arm stereotactic navigation  Surgeon:Rogers Ditter A.Letishia Elliott, M.D.  Asst. Surgeon: Jennetta, NP  Anesthesia: General  Indication: 78 year old female recently status post L5-S1 anterior lumbar interbody fusion.  Initially patient with very good results and complete resolution of her pain.  Approximately 10 days postoperatively the patient developed sudden onset of severe back pain with radiating pain into her right buttocks and posterior thigh.  Patient was admitted with concerns of superficial wound infection in her abdominal wall.  Evaluation of this wound found it to be healing well without evidence of infection although the patient did have some surface Candida dermatitis/panniculitis.  This has largely cleared with topical nystatin .  I reviewed the patient's CT scan and MRI scan from the outside institution.  This demonstrated no evidence of any infectious pathology and no obvious cause of her pain however I was concerned that she had some fracturing around the superior aspect of her sacrum.  The patient continued to have back pain when upright or moving.  She had no pain while sitting or supine.  Upright x-rays demonstrated evidence of migration of her cage at L5-S1 with worsening of her spondylolisthesis.  CT scanning confirms fracturing through the superior aspect of her sacral body.  No evidence of severe foraminal stenosis or ongoing nerve root compression.  Patient is status post prior posterolateral fusion from L2-L5 with pedicle  screw fixation.  I discussed situation with the patient.  I recommended augmentation/revision of her L5-S1 fusion by means of reexploring her previous posterior lateral fusion from L2-L5.  I recommended pelvic fixation via S2 AI screws with attachment to her previous lumbar construct.  I do not see any need for decompressive surgery at this point.  I remain convinced there is no evidence for any infectious pathology.  Operative note: After induction of anesthesia, patient position prone on the Diaz table and appropriately padded.  Patient's lumbar pelvic region was prepped and draped sterilely.  Incision made from L2-S2.  Dissection performed bilaterally.  Previously placed pedicle screws potation from L2-L5 was dissected free.  Upper sacrum and sacral ala as well as transverse processes of L5 were dissected free and decorticated.  O-arm array was then affixed to the patient's existing pedicle rod on the left.  An O-arm spin was performed for localization and navigation.  Using the O-arm stereotactic computer entry sites for the S2 AI screws.  Pilot holes were drilled using high-speed drill.  The tap was used to enter the S2 pedicle, cross the SI joint and entered the ilium with continuous navigation.  Good trajectory was confirmed with navigation.  The tap was removed.  The S2 pedicle and iliac wing were probed and found to be solidly within the bone.  8.5 mm Medtronic pedicle screw was then placed through the S2 pedicle into the ilium again under stereotactic guidance.  Good purchase was confirmed throughout.  Navigation was no longer necessary from this point forward and the array arm was disconnected.  The pedicle screw construct was disassembled from L2-L5.  Fusion inspected all levels and found to be solid at all levels with good posterolateral bone and no evidence of any  motion at any of the segments.  The screws at L2 had loosened slightly and I did not think they were necessary any further.  These were  removed.  The screws at L3-L4 and L5 were left in place and used in the final construct.  A short segment titanium rod was then contoured to place over the screw heads from L3-S2.  This was passed through the screw heads and locking caps were placed.  Locking caps were given a final tightening.  Bone revision protein soaked sponges were then placed over the decorticated sacral ala and decorticated L5 verse process.  5 cc Magnifuse allograft packets were then placed overlying the L5 transverse process and sacral ala.  Vancomycin  powder was then placed in the deep wound space.  Wound was then closed in layers with Vicryl sutures.  Steri-Strips and sterile dressing were applied.  No apparent complications.  Patient tolerated the procedure well and she returns to the recovery room postop.

## 2023-11-27 ENCOUNTER — Encounter (HOSPITAL_COMMUNITY): Payer: Self-pay | Admitting: Neurosurgery

## 2023-11-27 LAB — GLUCOSE, CAPILLARY: Glucose-Capillary: 105 mg/dL — ABNORMAL HIGH (ref 70–99)

## 2023-11-27 MED ORDER — POTASSIUM CHLORIDE CRYS ER 20 MEQ PO TBCR
40.0000 meq | EXTENDED_RELEASE_TABLET | Freq: Once | ORAL | Status: AC
  Administered 2023-11-27: 40 meq via ORAL
  Filled 2023-11-27: qty 2

## 2023-11-27 NOTE — Progress Notes (Signed)
 Occupational Therapy Re-Evaluation Patient Details Name: Carol [name] MRN: [id number] DOB: October 12, 1945 Today's Date: 11/27/2023   History of Present Illness   Pt is 78 yo female admitted on 11/18/23 with pain radiating to R LE with standing and walking from outside hospital and transferred for pain control and evaluation. CT and MRI demonstrated normal postoperative change without evidence of obvious infection or major complicating features. Pt was s/p L5-S1 fusion on 11/12/23 of which she initially did very well. Follow-up lumbosacral x-ray 9/3 with MD note  recommended that we move forward with posterior lumbosacral fusion utilizing pedicle screw and pelvic fixation for stabilization of her unstable L5-S1 segment. Plan for surgery on Monday 9/8. S/p re-exploration of L2-L5 posterolateral fusion with removal of hardware, screw placement. PMH includes anxiety, arthritis, HTN, DDD, multiple prior lumbar and cervical surgeries.     Clinical Impressions Pt now s/p exploration and revision prior spinal surgery. Pt premedicated prior to session, but immediately on arrival, reports she does not think she can get OOB today, but needs to use restroom. Agreeable to [facility] with encouragement. Pt mobilizing short distance in room with CGA-supervision with RW with guarded mobility and cues for hand placement during transfers. Pt able to recall back precautions with fair functional implementation with exception of needing max redirection for bed mobility due to tendency to attempt to come to long sitting in bed. Will continue to follow acutely; suspect pt will progress well with no follow up needed after discharge.      If plan is discharge home, recommend the following:   Assist for transportation;Assistance with cooking/housework;A little help with bathing/dressing/bathroom     Functional Status Assessment   Patient has had a recent decline in their functional status and demonstrates the ability to make  significant improvements in function in a reasonable and predictable amount of time.     Equipment Recommendations   None recommended by OT     Recommendations for Other Services         Precautions/Restrictions   Precautions Precautions: Back Precaution Booklet Issued: Yes (comment) Recall of Precautions/Restrictions: Intact Precaution/Restrictions Comments: brace present in room Required Braces or Orthoses: Spinal Brace Spinal Brace: Lumbar corset;Applied in sitting position Restrictions Weight Bearing Restrictions Per Provider Order: No Other Position/Activity Restrictions: Has brace at home for comfort     Mobility Bed Mobility Overal bed mobility: Modified Independent             General bed mobility comments: pt cioming to EOB without assist with HOB elevated, poor technique with pt coming into long sit in bed and moving BLE out. Reviewed log roll technique with max encouragement to use this technique in future efforts    Transfers Overall transfer level: Needs assistance Equipment used: Rolling walker (2 wheels) Transfers: Sit to/from Stand Sit to Stand: Supervision           General transfer comment: cues for reaching back with return to seated position      Balance Overall balance assessment: Needs assistance Sitting-balance support: Feet supported, No upper extremity supported Sitting balance-Leahy Scale: Good     Standing balance support: During functional activity, Bilateral upper extremity supported, Single extremity supported, No upper extremity supported Standing balance-Leahy Scale: Fair Standing balance comment: RW due to pain today                           ADL either performed or assessed with clinical judgement   ADL Overall ADL's :  Needs assistance/impaired Eating/Feeding: Independent   Grooming: Set up;Sitting   Upper Body Bathing: Set up;Sitting   Lower Body Bathing: Contact guard assist;Sit to/from stand    Upper Body Dressing : Set up;Sitting   Lower Body Dressing: Minimal assistance;Sit to/from stand   Toilet Transfer: Minimal assistance;Stand-pivot;Rolling walker (2 wheels)           Functional mobility during ADLs: Contact guard assist;Rolling walker (2 wheels)       Vision Baseline Vision/History: 1 Wears glasses Ability to See in Adequate Light: 0 Adequate Patient Visual Report: No change from baseline Vision Assessment?: No apparent visual deficits     Perception         Praxis         Pertinent Vitals/Pain Pain Assessment Pain Assessment: Faces Faces Pain Scale: Hurts even more Pain Location: R leg, back Pain Descriptors / Indicators: Grimacing Pain Intervention(s): Limited activity within patient's tolerance, Monitored during session     Extremity/Trunk Assessment Upper Extremity Assessment Upper Extremity Assessment: Overall WFL for tasks assessed   Lower Extremity Assessment Lower Extremity Assessment: Defer to PT evaluation   Cervical / Trunk Assessment Cervical / Trunk Assessment: Back Surgery   Communication Communication Communication: Impaired Factors Affecting Communication: Hearing impaired   Cognition Arousal: Alert Behavior During Therapy: WFL for tasks assessed/performed, Impulsive Cognition: No apparent impairments                               Following commands: Intact       Cueing  General Comments   Cueing Techniques: Verbal cues;Tactile cues  provided ice pack to R hip   Exercises     Shoulder Instructions      Home Living Family/patient expects to be discharged to:: Private residence Living Arrangements: Alone Available Help at Discharge: Family;Available PRN/intermittently Type of Home: Other(Comment) (condo) Home Access: Level entry     Home Layout: One level     Bathroom Shower/Tub: Walk-in shower;Door   [employer] Toilet: Handicapped height Bathroom Accessibility: Yes How Accessible:  Accessible via walker Home Equipment: Cane - single point;Shower seat - built in;Grab bars - toilet;Grab bars - tub/shower;Other (comment);Hand held shower head;Rolling Walker (2 wheels)          Prior Functioning/Environment Prior Level of Function : Independent/Modified Independent;Driving             Mobility Comments: Amb with cane at baseline, RW since initial back surgery (11/12/23).  Reports initially was doing well after surgery.  Had hx of sciatica and pain down bil legs that improved after surgery but returned in R LE resulting in difficulty moving and R LE buckling - presented back to hospital. ADLs Comments: indep with ADLs, IADLs, driving. Since back sx, pt able to manage ADLs, laundry, ordered groceries online and family brought in/put them away    OT Problem List: Decreased activity tolerance;Impaired balance (sitting and/or standing);Pain;Decreased strength   OT Treatment/Interventions: Self-care/ADL training;Therapeutic exercise;Energy conservation;DME and/or AE instruction;Therapeutic activities;Patient/family education;Balance training      OT Goals(Current goals can be found in the care plan section)   Acute Rehab OT Goals Patient Stated Goal: pain management OT Goal Formulation: With patient Time For Goal Achievement: 12/11/23 Potential to Achieve Goals: Good   OT Frequency:  Min 1X/week    Co-evaluation              AM-PAC OT 6 Clicks Daily Activity     Outcome Measure Help from another  person eating meals?: None Help from another person taking care of personal grooming?: A Little Help from another person toileting, which includes using toliet, bedpan, or urinal?: A Little Help from another person bathing (including washing, rinsing, drying)?: A Little Help from another person to put on and taking off regular upper body clothing?: A Little Help from another person to put on and taking off regular lower body clothing?: A Little 6 Click Score:  19   End of Session Equipment Utilized During Treatment: Gait belt;Rolling walker (2 wheels);Back brace Nurse Communication: Mobility status  Activity Tolerance: Patient tolerated treatment well Patient left: in chair;with call bell/phone within reach  OT Visit Diagnosis: Pain;Muscle weakness (generalized) (M62.81)                Time: 9181-9157 OT Time Calculation (min): 24 min Charges:  OT General Charges $OT Visit: 1 Visit OT Evaluation $OT Re-eval: 1 Re-eval OT Treatments $Self Care/Home Management : 8-22 mins  [name] FREDERICK, OTR/L [facility] Acute Rehabilitation Office: [phone]   [name] 11/27/2023, 10:41 AM

## 2023-11-27 NOTE — Plan of Care (Signed)

## 2023-11-27 NOTE — Progress Notes (Signed)
 PT Cancellation Note  Patient Details Name: Carol [name] MRN: [id number] DOB: November 28, 1945   Cancelled Treatment:    Reason Eval/Treat Not Completed: Pain limiting ability to participate;Fatigue/lethargy limiting ability to participate. Pt currently eating lunch and declines working with PT at this time. Reports she wants to wait until tomorrow. Pt is currently up in the chair and reports she walked to the bathroom by herself earlier today. Will continue to follow.    Stewart Pimenta D Felton Buczynski 11/27/2023, 3:30 PM  Leita Sable, PT, DPT Acute Rehabilitation Services Secure Chat Preferred Office: [phone]

## 2023-11-27 NOTE — Progress Notes (Signed)
 Postop day 1.  Patient with significant incisional pain and still with some right buttock pain.  No radiating pain numbness or weakness into the lower extremities.  Afebrile.  Vital signs are stable.  She is awake and alert.  She is oriented and appropriate.  Urine output is good.  Drain output is low.  Examination reveals normal motor strength in both lower extremities.  Sensory function intact.  Wound dressing is clean and dry.  Chest and abdomen benign.  Progressing reasonably well following revision lumbosacral pelvic fusion.  Continue efforts at mobilization.

## 2023-11-28 ENCOUNTER — Inpatient Hospital Stay (HOSPITAL_COMMUNITY)

## 2023-11-28 LAB — BASIC METABOLIC PANEL WITH GFR
Anion gap: 12 (ref 5–15)
BUN: 12 mg/dL (ref 8–23)
CO2: 28 mmol/L (ref 22–32)
Calcium: 8.7 mg/dL — ABNORMAL LOW (ref 8.9–10.3)
Chloride: 94 mmol/L — ABNORMAL LOW (ref 98–111)
Creatinine, Ser: 0.85 mg/dL (ref 0.44–1.00)
GFR, Estimated: >60 mL/min (ref >60)
Glucose, Bld: 115 mg/dL — ABNORMAL HIGH (ref 70–99)
Potassium: 3.2 mmol/L — ABNORMAL LOW (ref 3.5–5.1)
Sodium: 134 mmol/L — ABNORMAL LOW (ref 135–145)

## 2023-11-28 MED ORDER — POTASSIUM CHLORIDE CRYS ER 20 MEQ PO TBCR
40.0000 meq | EXTENDED_RELEASE_TABLET | Freq: Two times a day (BID) | ORAL | Status: DC
  Administered 2023-11-28 – 2023-11-30 (×5): 40 meq via ORAL
  Filled 2023-11-28 (×5): qty 2

## 2023-11-28 NOTE — Progress Notes (Signed)
 Physical Therapy Treatment  Patient Details Name: Carol [name] MRN: [id number] DOB: 1945/05/20 Today's Date: 11/28/2023   History of Present Illness Pt is 78 yo female admitted on 11/18/23 with pain radiating to R LE with standing and walking from outside hospital and transferred for pain control and evaluation. CT and MRI demonstrated normal postoperative change without evidence of obvious infection or major complicating features. Pt was s/p L5-S1 fusion on 11/12/23 of which she initially did very well. Follow-up lumbosacral x-ray 9/3 with MD note  recommended that we move forward with posterior lumbosacral fusion utilizing pedicle screw and pelvic fixation for stabilization of her unstable L5-S1 segment. Plan for surgery on Monday 9/8. S/p re-exploration of L2-L5 posterolateral fusion with removal of hardware, screw placement. PMH includes anxiety, arthritis, HTN, DDD, multiple prior lumbar and cervical surgeries.    PT Comments  Pt reassessed s/p surgery on 9/8. Pt ambulating well without assistance and RW for support. She was able to recall 3/3 back precautions, and was receptive to review of all education. Pt reports pain improving with ambulation, and tolerated ~280' this session. Pt reports she will have good support upon d/c. Will continue to follow and progress as able per POC.     If plan is discharge home, recommend the following: Assist for transportation;Assistance with cooking/housework;A little help with walking and/or transfers   Can travel by private vehicle        Equipment Recommendations  None recommended by PT    Recommendations for Other Services       Precautions / Restrictions Precautions Precautions: Back Precaution Booklet Issued: Yes (comment) Recall of Precautions/Restrictions: Intact Precaution/Restrictions Comments: brace present in room Required Braces or Orthoses: Spinal Brace Spinal Brace: Lumbar corset;Applied in sitting position Restrictions Weight  Bearing Restrictions Per Provider Order: No     Mobility  Bed Mobility               General bed mobility comments: Pt was received sitting up EOB    Transfers Overall transfer level: Needs assistance Equipment used: Rolling walker (2 wheels) Transfers: Sit to/from Stand Sit to Stand: Supervision           General transfer comment: Pt demonstrated proper hand placement on seated surface for safety.    Ambulation/Gait Ambulation/Gait assistance: Contact guard assist, Supervision Gait Distance (Feet): 280 Feet Assistive device: Rolling walker (2 wheels) Gait Pattern/deviations: Step-through pattern, Trunk flexed, Decreased stride length Gait velocity: Decreased Gait velocity interpretation: 1.31 - 2.62 ft/sec, indicative of limited community ambulator   General Gait Details: VC's for improved posture, closer walker proximity and forward gaze. No assist required. No overt LOB noted.   Stairs             Wheelchair Mobility     Tilt Bed    Modified Rankin (Stroke Patients Only)       Balance Overall balance assessment: Needs assistance Sitting-balance support: Feet supported, No upper extremity supported Sitting balance-Leahy Scale: Good     Standing balance support: During functional activity, Bilateral upper extremity supported, Single extremity supported, No upper extremity supported Standing balance-Leahy Scale: Fair                              Hotel manager: Impaired Factors Affecting Communication: Hearing impaired  Cognition Arousal: Alert Behavior During Therapy: WFL for tasks assessed/performed, Impulsive   PT - Cognitive impairments: No apparent impairments  Following commands: Intact      Cueing Cueing Techniques: Verbal cues, Tactile cues  Exercises      General Comments        Pertinent Vitals/Pain Pain Assessment Pain Assessment: Faces Faces  Pain Scale: Hurts a little bit Pain Location: back Pain Descriptors / Indicators: Operative site guarding, Sore Pain Intervention(s): Limited activity within patient's tolerance, Monitored during session, Repositioned    Home Living                          Prior Function            PT Goals (current goals can now be found in the care plan section) Acute Rehab PT Goals Patient Stated Goal: Less pain so I can return home PT Goal Formulation: With patient Time For Goal Achievement: 12/03/23 Potential to Achieve Goals: Good Progress towards PT goals: Progressing toward goals    Frequency    Min 5X/week      PT Plan      Co-evaluation              AM-PAC PT 6 Clicks Mobility   Outcome Measure  Help needed turning from your back to your side while in a flat bed without using bedrails?: A Little Help needed moving from lying on your back to sitting on the side of a flat bed without using bedrails?: A Little Help needed moving to and from a bed to a chair (including a wheelchair)?: A Little Help needed standing up from a chair using your arms (e.g., wheelchair or bedside chair)?: A Little Help needed to walk in hospital room?: A Little Help needed climbing 3-5 steps with a railing? : A Little 6 Click Score: 18    End of Session Equipment Utilized During Treatment: Gait belt Activity Tolerance: Patient limited by pain Patient left: in chair;with call bell/phone within reach Nurse Communication: Mobility status PT Visit Diagnosis: Difficulty in walking, not elsewhere classified (R26.2)     Time: 8589-8571 PT Time Calculation (min) (ACUTE ONLY): 18 min  Charges:    $Gait Training: 8-22 mins PT General Charges $$ ACUTE PT VISIT: 1 Visit                     Leita Sable, PT, DPT Acute Rehabilitation Services Secure Chat Preferred Office: [phone]    [name] 11/28/2023, 3:43 PM

## 2023-11-28 NOTE — Progress Notes (Signed)
 Occupational Therapy Treatment Patient Details Name: Carol [name] MRN: [id number] DOB: [birth year] Today's Date: 11/28/2023   History of present illness Pt is 78 yo female admitted on 11/18/23 with pain radiating to R LE with standing and walking from outside hospital and transferred for pain control and evaluation. CT and MRI demonstrated normal postoperative change without evidence of obvious infection or major complicating features. Pt was s/p L5-S1 fusion on 11/12/23 of which she initially did very well. Follow-up lumbosacral x-ray 9/3 with MD note  recommended that we move forward with posterior lumbosacral fusion utilizing pedicle screw and pelvic fixation for stabilization of her unstable L5-S1 segment. Plan for surgery on Monday 9/8. S/p re-exploration of L2-L5 posterolateral fusion with removal of hardware, screw placement. PMH includes anxiety, arthritis, HTN, DDD, multiple prior lumbar and cervical surgeries.   OT comments  Pt seen with all education provided and no further acute OT needs; would recommend mobility specialist follow for encouragement of frequent mobility acutely. Pt able to don LB ADL and perform various standing tasks 10+ mins with supervision approaching mod I. All goals met and pt with good family support. OT to sign off. Please re-consult if change in status.       If plan is discharge home, recommend the following:  Assist for transportation;Assistance with cooking/housework   Equipment Recommendations  None recommended by OT    Recommendations for Other Services      Precautions / Restrictions Precautions Precautions: Back Precaution Booklet Issued: Yes (comment) Recall of Precautions/Restrictions: Intact Precaution/Restrictions Comments: brace present in room Required Braces or Orthoses: Spinal Brace Spinal Brace: Lumbar corset;Applied in sitting position Restrictions Weight Bearing Restrictions Per Provider Order: No       Mobility Bed Mobility                General bed mobility comments: Pt was received sitting up EOB    Transfers Overall transfer level: Needs assistance Equipment used: Rolling walker (2 wheels) Transfers: Sit to/from Stand Sit to Stand: Supervision           General transfer comment: Pt demonstrated proper hand placement on seated surface for safety.     Balance Overall balance assessment: Needs assistance Sitting-balance support: Feet supported, No upper extremity supported Sitting balance-Leahy Scale: Good     Standing balance support: During functional activity, Bilateral upper extremity supported, Single extremity supported, No upper extremity supported Standing balance-Leahy Scale: Fair                             ADL either performed or assessed with clinical judgement   ADL Overall ADL's : Needs assistance/impaired     Grooming: Wash/dry hands;Supervision/safety;Standing Grooming Details (indicate cue type and reason): approaching mod I             Lower Body Dressing: Supervision/safety;Sit to/from stand Lower Body Dressing Details (indicate cue type and reason): approaching mod I Toilet Transfer: Supervision/safety;Ambulation;Rolling walker (2 wheels)           Functional mobility during ADLs: Supervision/safety;Rolling walker (2 wheels)      Extremity/Trunk Assessment Upper Extremity Assessment Upper Extremity Assessment: Overall WFL for tasks assessed   Lower Extremity Assessment Lower Extremity Assessment: Defer to PT evaluation        Vision   Vision Assessment?: No apparent visual deficits   Perception     Praxis     Communication Communication Communication: Impaired Factors Affecting Communication: Hearing impaired   Cognition  Arousal: Alert Behavior During Therapy: WFL for tasks assessed/performed, Impulsive Cognition: No apparent impairments                               Following commands: Intact        Cueing    Cueing Techniques: Verbal cues, Tactile cues  Exercises      Shoulder Instructions       General Comments      Pertinent Vitals/ Pain       Pain Assessment Pain Assessment: Faces Faces Pain Scale: Hurts a little bit Pain Location: back Pain Descriptors / Indicators: Operative site guarding, Sore Pain Intervention(s): Limited activity within patient's tolerance, Monitored during session  Home Living                                          Prior Functioning/Environment              Frequency   (OT to sign off)        Progress Toward Goals  OT Goals(current goals can now be found in the care plan section)  Progress towards OT goals: Goals met/education completed, patient discharged from OT  Acute Rehab OT Goals OT Goal Formulation: With patient Time For Goal Achievement: 12/11/23 Potential to Achieve Goals: Good ADL Goals Additional ADL Goal #1: Pt to demonstrate full ADLs with Modified Independence with implementation of back precautions Additional ADL Goal #2: Pt to increase standing activity tolerance > 10 min during ADLs, IADLs and mobility  Plan      Co-evaluation                 AM-PAC OT 6 Clicks Daily Activity     Outcome Measure   Help from another person eating meals?: None Help from another person taking care of personal grooming?: A Little Help from another person toileting, which includes using toliet, bedpan, or urinal?: A Little Help from another person bathing (including washing, rinsing, drying)?: A Little Help from another person to put on and taking off regular upper body clothing?: A Little Help from another person to put on and taking off regular lower body clothing?: A Little 6 Click Score: 19    End of Session Equipment Utilized During Treatment: Gait belt;Rolling walker (2 wheels);Back brace  OT Visit Diagnosis: Pain;Muscle weakness (generalized) (M62.81)   Activity Tolerance Patient tolerated  treatment well   Patient Left in chair;with call bell/phone within reach   Nurse Communication Mobility status        Time: 8573-8553 OT Time Calculation (min): 20 min  Charges: OT General Charges $OT Visit: 1 Visit OT Treatments $Self Care/Home Management : 8-22 mins  Elma JONETTA Penner, OTD, OTR/L [facility] Acute Rehabilitation Office: [phone]   Elma JONETTA Penner 11/28/2023, 5:22 PM

## 2023-11-28 NOTE — Plan of Care (Signed)

## 2023-11-28 NOTE — Progress Notes (Signed)
 Postop day 2.  Patient's incisional pain better but still with some buttocks and posterior right lower extremity pain.  No numbness or weakness.  Drain output low.  Mobility marginal for discharge.  Afebrile.  Vital signs are stable.  Awake and alert.  Oriented and appropriate.  Motor/function intact.  Wound clean and dry.  Progressing reasonably well.  Check postop x-rays today.  Working towards discharge tomorrow or the next day.

## 2023-11-29 LAB — BASIC METABOLIC PANEL WITH GFR
Anion gap: 10 (ref 5–15)
BUN: 10 mg/dL (ref 8–23)
CO2: 29 mmol/L (ref 22–32)
Calcium: 8.5 mg/dL — ABNORMAL LOW (ref 8.9–10.3)
Chloride: 92 mmol/L — ABNORMAL LOW (ref 98–111)
Creatinine, Ser: 0.83 mg/dL (ref 0.44–1.00)
GFR, Estimated: >60 mL/min (ref >60)
Glucose, Bld: 100 mg/dL — ABNORMAL HIGH (ref 70–99)
Potassium: 3.3 mmol/L — ABNORMAL LOW (ref 3.5–5.1)
Sodium: 131 mmol/L — ABNORMAL LOW (ref 135–145)

## 2023-11-29 MED ORDER — VANCOMYCIN HCL 500 MG/100ML IV SOLN: 500.0000 mg | Freq: Two times a day (BID) | INTRAVENOUS | Status: DC

## 2023-11-29 NOTE — Progress Notes (Signed)
 Physical Therapy Treatment Patient Details Name: Carol [name] MRN: [id number] DOB: 06-15-1945 Today's Date: 11/29/2023   History of Present Illness Pt is 78 yo female admitted on 11/18/23 with pain radiating to R LE with standing and walking from outside hospital and transferred for pain control and evaluation. CT and MRI demonstrated normal postoperative change without evidence of obvious infection or major complicating features. Pt was s/p L5-S1 fusion on 11/12/23 of which she initially did very well. Follow-up lumbosacral x-ray 9/3 with MD note  recommended that we move forward with posterior lumbosacral fusion utilizing pedicle screw and pelvic fixation for stabilization of her unstable L5-S1 segment. Plan for surgery on Monday 9/8. S/p re-exploration of L2-L5 posterolateral fusion with removal of hardware, screw placement. PMH includes anxiety, arthritis, HTN, DDD, multiple prior lumbar and cervical surgeries.    PT Comments  Pt resting in bed on arrival, sleeping but easily roused and agreeable to session with continued progress towards acute goals. Pt abel to progress stair training this session with good recall for safe LE sequencing as pt reporting her RLE feels weaker than L. Pt continues to demonstrate steady ambulation and transfers with grossly supervision for safety with RW for support. Continued education on precautions, brace application/wearing schedule, appropriate activity progression, and car transfer, with pt verbalizing understanding. Pt continues to benefit from skilled PT services to progress toward functional mobility goals.       If plan is discharge home, recommend the following: Assist for transportation;Assistance with cooking/housework;A little help with walking and/or transfers   Can travel by private vehicle        Equipment Recommendations  None recommended by PT    Recommendations for Other Services       Precautions / Restrictions Precautions Precautions:  Back Precaution Booklet Issued: Yes (comment) Recall of Precautions/Restrictions: Intact Precaution/Restrictions Comments: brace present in room Required Braces or Orthoses: Spinal Brace Spinal Brace: Lumbar corset;Applied in sitting position Restrictions Weight Bearing Restrictions Per Provider Order: No     Mobility  Bed Mobility Overal bed mobility: Modified Independent Bed Mobility: Rolling, Sidelying to Sit Rolling: Supervision Sidelying to sit: Supervision       General bed mobility comments: light cues for log roll technique    Transfers Overall transfer level: Needs assistance Equipment used: Rolling walker (2 wheels) Transfers: Sit to/from Stand Sit to Stand: Supervision           General transfer comment: Pt demonstrated proper hand placement on seated surface for safety.    Ambulation/Gait Ambulation/Gait assistance: Contact guard assist, Supervision Gait Distance (Feet): 325 Feet Assistive device: Rolling walker (2 wheels) Gait Pattern/deviations: Step-through pattern, Trunk flexed, Decreased stride length Gait velocity: Decreased     General Gait Details: Good recall for RW proximity No assist required. No overt LOB noted.   Stairs Stairs: Yes Stairs assistance: Contact guard assist Stair Management: One rail Right, Step to pattern, Forwards Number of Stairs: 2 General stair comments: CGA for safety, good recall for safe sequencing   Wheelchair Mobility     Tilt Bed    Modified Rankin (Stroke Patients Only)       Balance Overall balance assessment: Needs assistance Sitting-balance support: Feet supported, No upper extremity supported Sitting balance-Leahy Scale: Good     Standing balance support: During functional activity, Bilateral upper extremity supported, Single extremity supported, No upper extremity supported Standing balance-Leahy Scale: Fair Standing balance comment: RW due to pain today  Communication Communication Communication: Impaired Factors Affecting Communication: Hearing impaired  Cognition Arousal: Alert Behavior During Therapy: WFL for tasks assessed/performed, Impulsive   PT - Cognitive impairments: No apparent impairments                         Following commands: Intact      Cueing Cueing Techniques: Verbal cues, Tactile cues  Exercises      General Comments        Pertinent Vitals/Pain Pain Assessment Pain Assessment: 0-10 Pain Score: 6  Pain Location: back Pain Descriptors / Indicators: Operative site guarding, Sore Pain Intervention(s): Monitored during session, Limited activity within patient's tolerance, Repositioned    Home Living                          Prior Function            PT Goals (current goals can now be found in the care plan section) Acute Rehab PT Goals Patient Stated Goal: Less pain so I can return home PT Goal Formulation: With patient Time For Goal Achievement: 12/03/23 Progress towards PT goals: Progressing toward goals    Frequency    Min 5X/week      PT Plan      Co-evaluation              AM-PAC PT 6 Clicks Mobility   Outcome Measure  Help needed turning from your back to your side while in a flat bed without using bedrails?: A Little Help needed moving from lying on your back to sitting on the side of a flat bed without using bedrails?: A Little Help needed moving to and from a bed to a chair (including a wheelchair)?: A Little Help needed standing up from a chair using your arms (e.g., wheelchair or bedside chair)?: A Little Help needed to walk in hospital room?: A Little Help needed climbing 3-5 steps with a railing? : A Little 6 Click Score: 18    End of Session Equipment Utilized During Treatment: Gait belt Activity Tolerance: Patient limited by pain Patient left: in chair;with call bell/phone within reach Nurse Communication: Mobility status PT Visit  Diagnosis: Difficulty in walking, not elsewhere classified (R26.2)     Time: 1339-1400 PT Time Calculation (min) (ACUTE ONLY): 21 min  Charges:    $Gait Training: 8-22 mins PT General Charges $$ ACUTE PT VISIT: 1 Visit                     Therisa R. PTA Acute Rehabilitation Services Office: [phone]   Therisa CHRISTELLA Boor 11/29/2023, 4:17 PM

## 2023-11-29 NOTE — Progress Notes (Signed)
 Pharmacy Antibiotic Note  Carol [name] is a 78 y.o. female s/p spinal procedure with a drain in place. Pharmacy has been consulted for vancomycin  dosing.   Plan: -change Vancomycin  500mg  IV q12h [eAUC 455.8]  -Will follow plans for drain removal  Height: 5' 3 (160 cm) Weight: 82.6 kg (182 lb) IBW/kg (Calculated) : 52.4  Temp (24hrs), Avg:98.6 F (37 C), Min:98 F (36.7 C), Max:98.9 F (37.2 C)  Recent Labs  Lab 11/25/23 0447 11/26/23 0944 11/28/23 0432 11/29/23 0532  CREATININE 0.97 1.00 0.85 0.83    Estimated Creatinine Clearance: 58.7 mL/min (by C-G formula based on SCr of 0.83 mg/dL).    Allergies  Allergen Reactions   Garamycin [Gentamicin] Itching, Dermatitis, Rash and Other (See Comments)   Apresoline [Hydralazine] Nausea And Vomiting   Penicillins Hives   Boniva [Ibandronate] Other (See Comments)    Arthralgias  Tooth breaks, falling out   Celebrex [Celecoxib] Other (See Comments)    Arthralgias    Roxicodone  [Oxycodone ] Itching    OK to take with Benadryl     Thank you for allowing pharmacy to be a part of this patient's care.    Benedetta Heath BS, PharmD, BCPS Clinical Pharmacist 11/29/2023 8:25 AM  Contact: [phone] after 3 PM

## 2023-11-29 NOTE — Progress Notes (Signed)
 Patient with increased back pain today.  Still having some right buttocks and posterior thigh pain.  No numbness or weakness.  Afebrile.  Vital signs are stable.  Wound clean and dry.  Motor and sensory function intact.  X-rays yesterday looked good with good appearance of her pelvic fixation.  Continue efforts at pain control and mobilization.  Hopefully pain will begin to turn the corner over the next 24 hours.  Probable discharge home tomorrow.

## 2023-11-29 NOTE — Plan of Care (Signed)
  Problem: Education: Goal: Knowledge of General Education information will improve Description: Including pain rating scale, medication(s)/side effects and non-pharmacologic comfort measures Outcome: Progressing   Problem: Clinical Measurements: Goal: Ability to maintain clinical measurements within normal limits will improve Outcome: Progressing Goal: Will remain free from infection Outcome: Progressing Goal: Diagnostic test results will improve Outcome: Progressing Goal: Respiratory complications will improve Outcome: Progressing Goal: Cardiovascular complication will be avoided Outcome: Progressing   Problem: Activity: Goal: Risk for activity intolerance will decrease Outcome: Progressing   Problem: Pain Managment: Goal: General experience of comfort will improve and/or be controlled Outcome: Progressing

## 2023-11-30 MED ORDER — CYCLOBENZAPRINE HCL 5 MG PO TABS: 5.0000 mg | ORAL_TABLET | Freq: Three times a day (TID) | ORAL | 0 refills | Status: AC | PRN

## 2023-11-30 MED ORDER — POLYETHYLENE GLYCOL 3350 17 G PO PACK: 17.0000 g | PACK | Freq: Every day | ORAL | 0 refills | Status: AC

## 2023-11-30 MED ORDER — HYDROCODONE-ACETAMINOPHEN 10-325 MG PO TABS: 1.0000 | ORAL_TABLET | Freq: Four times a day (QID) | ORAL | 0 refills | Status: AC | PRN

## 2023-11-30 NOTE — Discharge Instructions (Addendum)
 Wound Care Keep incision covered and dry for 3 days. You may remove the Honeycomb dressing in 3 days. Leave steri-strips on back.  They will fall off by themselves. Do not put any creams, lotions, or ointments on incision. You are fine to shower. Let water run over incision and pat dry.  Activity Walk each and every day, increasing distance each day. No lifting greater than 5 lbs.  Avoid excessive back motion. No driving for 2 weeks; may ride as a passenger locally.  Diet Resume your normal diet.   Return to Work Will be discussed at your follow up appointment.  Call Your Doctor If Any of These Occur Redness, drainage, or swelling at the wound.  Temperature greater than 101 degrees. Severe pain not relieved by pain medication. Incision starts to come apart.  Follow Up Appt Call [phone] today for appointment in 10 days if you don't already have one or for any problems.

## 2023-11-30 NOTE — TOC Transition Note (Signed)
 Transition of Care [facility]) - Discharge Note   Patient Details  Name: Carol [name] MRN: [id number] Date of Birth: 05/31/1945  [facility] of [facility]) CM/SW Contact:  Rosalva Jon Bloch, RN Phone Number: 11/30/2023, 2:26 PM   Clinical Narrative:    Patient will DC to: home Anticipated DC date: 11/30/2023 Family notified: yes Transport by: car  Readmitted on 11/18/2023 with cellulitis s/p L5-S1 lumbar interbody fusion on 8/25.  Per MD patient ready for DC today. RN, patient and patient's husband aware of DC.   Pt without home health needs. Referral made with [facility] [phone]), equipment will be delivered to d/c lounge and provided to pt. Pt without RX med concerns. Post hospital f/u noted on AVS. Husband to provide transportation to home.  RNCM will sign off for now as intervention is no longer needed. Please consult us  again if new needs arise.    Final next level of care: Home/Self Care Barriers to Discharge: No Barriers Identified   Patient Goals and CMS Choice            Discharge Placement                       Discharge Plan and Services Additional resources added to the After Visit Summary for                  DME Arranged: Walker rolling DME Agency: [employer] Date DME Agency Contacted: 11/30/23 Time DME Agency Contacted: 1425 Representative spoke with at DME Agency: Darlyn            Social Drivers of Health (SDOH) Interventions SDOH Screenings   Food Insecurity: No Food Insecurity (11/18/2023)  Housing: Low Risk  (11/18/2023)  Transportation Needs: No Transportation Needs (11/18/2023)  Utilities: Not At Risk (11/18/2023)  Depression (PHQ2-9): Low Risk  (08/10/2022)  Financial Resource Strain: Low Risk  (11/29/2022)   Received from [facility]  Social Connections: Socially Isolated (11/18/2023)  Tobacco Use: Medium Risk (11/26/2023)     Readmission Risk Interventions     No data to display

## 2023-11-30 NOTE — Progress Notes (Signed)
 Pt verbalized understanding of discharge plan of care.  PIV removed and pressure dressing applied.   Currently waiting for DME RW to be ordered then patient will transfer to lounge and will call for her ride.   Primary RN angela updated.

## 2023-11-30 NOTE — Progress Notes (Signed)
 RW order has been placed by CM and will be sent to Discharge Lounge. Then Patient will call ride once RW has arrived.  Transport called

## 2023-11-30 NOTE — Discharge Summary (Signed)
 Physician Discharge Summary     Providing Compassionate, Quality Care - Together   Patient ID: Carol [name] MRN: [id number] DOB/AGE: 1945-05-20 78 y.o.  Admit date: 11/18/2023 Discharge date: 11/30/2023  Admission Diagnoses: Post operative pain  Discharge Diagnoses:  Principal Problem:   Post-operative pain   Discharged Condition: good  Hospital Course: Patient underwent re-exploration of L2-L5 posterolateral fusion, with removal of hardware followed by L5-S1 posterior lateral arthrodesis by Dr. Louis on 11/26/2023. She was admitted to [facility] following recovery from anesthesia in the PACU. Her postoperative course has been uncomplicated. She has worked with both physical and occupational therapies who feel the patient is ready for discharge home. She is ambulating with the aid of a walker. She is tolerating a normal diet. She is not having any bowel or bladder dysfunction. Her pain is well-controlled with oral pain medication. She is ready for discharge home.   Consults: PT/OT/TOC  Significant Diagnostic Studies: radiology: DG Lumbar Spine 2-3 Views Result Date: 11/28/2023 CLINICAL DATA:  Postoperative back pain. EXAM: LUMBAR SPINE - 2-3 VIEW COMPARISON:  November 21, 2023.  November 23, 2023. FINDINGS: Status post surgical fusion extending from L2 to S1, with intrapedicular screw placement at L3, L4 and L5. There has been interval intrapedicular screw placement present at S1. No fracture or spondylolisthesis is noted. IMPRESSION: Postsurgical changes as described above. No acute abnormality seen. Electronically Signed   By: Lynwood Landy Raddle M.D.   On: 11/28/2023 14:31   DG O-ARM IMAGE ONLY/NO REPORT Result Date: 11/26/2023 There is no Radiologist interpretation  for this exam.    Treatments: surgery:   Reexploration of L2-L5 posterolateral fusion with removal of hardware   L5-S1 posterior lateral arthrodesis utilizing bone returning protein and morselized allograft   Placement of  bilateral S2 alar iliac screws with revision instrumentation from L3-S2 iliac wing utilizing O-arm stereotactic navigation  Discharge Exam: Blood pressure (!) 102/57, pulse 89, temperature 98.9 F (37.2 C), temperature source Oral, resp. rate 17, height 5' 3 (1.6 m), weight 82.6 kg, SpO2 95%.  Alert and oriented x 4 PERRLA CN II-XII grossly intact MAE, Strength and sensation intact Incision is covered with Honeycomb dressing and Steri Strips; Dressing is clean, dry, and intact   Disposition: Discharge disposition: 01-Home or Self Care       Discharge Instructions      Remove dressing in 72 hours   Complete by: As directed    Call MD for:  difficulty breathing, headache or visual disturbances   Complete by: As directed    Call MD for:  hives   Complete by: As directed    Call MD for:  persistant nausea and vomiting   Complete by: As directed    Call MD for:  redness, tenderness, or signs of infection (pain, swelling, redness, odor or green/yellow discharge around incision site)   Complete by: As directed    Call MD for:  severe uncontrolled pain   Complete by: As directed    Diet - low sodium heart healthy   Complete by: As directed    Incentive spirometry RT   Complete by: As directed    Increase activity slowly   Complete by: As directed       Allergies as of 11/30/2023       Reactions   Garamycin [gentamicin] Itching, Dermatitis, Rash, Other (See Comments)   Apresoline [hydralazine] Nausea And Vomiting   Penicillins Hives   Boniva [ibandronate] Other (See Comments)   Arthralgias  Tooth breaks, falling  out   Celebrex [celecoxib] Other (See Comments)   Arthralgias    Roxicodone  [oxycodone ] Itching   OK to take with Benadryl         Medication List     STOP taking these medications    methocarbamol  500 MG tablet Commonly known as: ROBAXIN        TAKE these medications    albuterol  108 (90 Base) MCG/ACT inhaler Commonly known as: VENTOLIN   HFA Inhale 2 puffs into the lungs every 6 (six) hours as needed for wheezing or shortness of breath.   cetirizine  10 MG tablet Commonly known as: ZYRTEC  Take 10 mg by mouth daily.   chlorthalidone  50 MG tablet Commonly known as: HYGROTON  Take 50 mg by mouth daily.   clonazePAM  0.5 MG tablet Commonly known as: KLONOPIN  Take 0.5 mg by mouth daily as needed for anxiety.   cyclobenzaprine  5 MG tablet Commonly known as: FLEXERIL  Take 1 tablet (5 mg total) by mouth 3 (three) times daily as needed for muscle spasms.   gabapentin  100 MG capsule Commonly known as: NEURONTIN  Take 100 mg by mouth at bedtime.   HYDROcodone -acetaminophen  10-325 MG tablet Commonly known as: NORCO Take 1 tablet by mouth every 6 (six) hours as needed for moderate pain (pain score 4-6) or severe pain (pain score 7-10).   meloxicam  15 MG tablet Commonly known as: MOBIC  Take 15 mg by mouth daily.   polyethylene glycol 17 g packet Commonly known as: MiraLax  Take 17 g by mouth daily.   potassium chloride  SA 20 MEQ tablet Commonly known as: KLOR-CON  M Take 20 mEq by mouth 2 (two) times daily.   pyridOXINE  100 MG tablet Commonly known as: VITAMIN B6 Take 100 mg by mouth daily.   rosuvastatin  5 MG tablet Commonly known as: CRESTOR  Take 1 tablet by mouth daily.   sulfamethoxazole -trimethoprim  800-160 MG tablet Commonly known as: BACTRIM  DS Take 1 tablet by mouth 2 (two) times daily.   SUMAtriptan  100 MG tablet Commonly known as: IMITREX  Take 1 tablet (100 mg total) by mouth daily as needed for migraine. May repeat in 2 hours if headache persists or recurs.   traZODone  50 MG tablet Commonly known as: DESYREL  Take 50 mg by mouth at bedtime as needed for sleep.   venlafaxine  XR 150 MG 24 hr capsule Commonly known as: EFFEXOR -XR Take 150 mg by mouth daily.   venlafaxine  XR 75 MG 24 hr capsule Commonly known as: EFFEXOR -XR TAKE 1 CAPSULE(75 MG) BY MOUTH DAILY   VITAMIN C PO Take 1 tablet by  mouth daily.   VITAMIN D  PO Take 1 capsule by mouth daily.               Durable Medical Equipment  (From admission, onward)           Start     Ordered   11/26/23 1550  DME Walker rolling  Once       Question:  Patient needs a walker to treat with the following condition  Answer:  Lumbar pseudoarthrosis   11/26/23 1549   11/26/23 1550  DME 3 n 1  Once        11/26/23 1549            Follow-up Information     Louis Shove, MD. Schedule an appointment as soon as possible for a visit in 10 day(s).   Specialty: Neurosurgery Contact information: [address] [place] [address] [phone]  Signed: Gerard Beck, DNP, AGNP-C Nurse Practitioner  [facility] [address], [place], [place] [address] P: [phone]    F: [phone]  11/30/2023, 11:30 AM

## 2023-12-31 ENCOUNTER — Telehealth: Payer: Self-pay

## 2023-12-31 NOTE — Telephone Encounter (Signed)
 All medical records were faxed to [facility] ph:[phone] Fax;[phone]

## 2024-05-08 ENCOUNTER — Encounter (HOSPITAL_COMMUNITY)

## 2024-05-08 ENCOUNTER — Encounter
